# Patient Record
Sex: Female | Born: 1935 | Race: Black or African American | Hispanic: No | Marital: Married | Smoking: Never smoker
Health system: Southern US, Community
[De-identification: ages and names within clinical notes are randomized; demographics above are authoritative.]

## PROBLEM LIST (undated history)

## (undated) DIAGNOSIS — I10 Essential (primary) hypertension: Secondary | ICD-10-CM

## (undated) DIAGNOSIS — F419 Anxiety disorder, unspecified: Secondary | ICD-10-CM

## (undated) HISTORY — PX: TONSILLECTOMY: SUR1361

## (undated) HISTORY — PX: HEEL SPUR SURGERY: SHX665

## (undated) HISTORY — PX: ABDOMINAL HYSTERECTOMY: SHX81

## (undated) HISTORY — PX: FOOT SURGERY: SHX648

## (undated) HISTORY — PX: APPENDECTOMY: SHX54

---

## 1998-08-24 ENCOUNTER — Emergency Department (HOSPITAL_COMMUNITY): Admission: EM | Admit: 1998-08-24 | Discharge: 1998-08-24 | Payer: Self-pay | Admitting: Emergency Medicine

## 2001-08-20 ENCOUNTER — Ambulatory Visit (HOSPITAL_BASED_OUTPATIENT_CLINIC_OR_DEPARTMENT_OTHER): Admission: RE | Admit: 2001-08-20 | Discharge: 2001-08-20 | Payer: Self-pay | Admitting: Orthopedic Surgery

## 2005-04-01 ENCOUNTER — Emergency Department (HOSPITAL_COMMUNITY): Admission: EM | Admit: 2005-04-01 | Discharge: 2005-04-01 | Payer: Self-pay | Admitting: Family Medicine

## 2005-06-28 ENCOUNTER — Emergency Department (HOSPITAL_COMMUNITY): Admission: EM | Admit: 2005-06-28 | Discharge: 2005-06-28 | Payer: Self-pay | Admitting: Emergency Medicine

## 2006-07-10 ENCOUNTER — Emergency Department (HOSPITAL_COMMUNITY): Admission: EM | Admit: 2006-07-10 | Discharge: 2006-07-10 | Payer: Self-pay | Admitting: Emergency Medicine

## 2006-07-14 ENCOUNTER — Emergency Department (HOSPITAL_COMMUNITY): Admission: EM | Admit: 2006-07-14 | Discharge: 2006-07-14 | Payer: Self-pay | Admitting: Emergency Medicine

## 2006-10-12 ENCOUNTER — Emergency Department (HOSPITAL_COMMUNITY): Admission: EM | Admit: 2006-10-12 | Discharge: 2006-10-12 | Payer: Self-pay | Admitting: Family Medicine

## 2010-02-09 ENCOUNTER — Emergency Department (HOSPITAL_COMMUNITY): Admission: EM | Admit: 2010-02-09 | Discharge: 2010-02-09 | Payer: Self-pay | Admitting: Family Medicine

## 2010-05-15 ENCOUNTER — Emergency Department (HOSPITAL_COMMUNITY): Admission: EM | Admit: 2010-05-15 | Discharge: 2010-05-15 | Payer: Self-pay | Admitting: Family Medicine

## 2010-08-24 ENCOUNTER — Encounter: Payer: Self-pay | Admitting: Family Medicine

## 2010-08-25 ENCOUNTER — Encounter: Payer: Self-pay | Admitting: Family Medicine

## 2010-09-06 ENCOUNTER — Inpatient Hospital Stay (INDEPENDENT_AMBULATORY_CARE_PROVIDER_SITE_OTHER)
Admission: RE | Admit: 2010-09-06 | Discharge: 2010-09-06 | Disposition: A | Discharge status: Home / Self Care | Payer: Medicare Other | Source: Ambulatory Visit | Attending: Family Medicine | Admitting: Family Medicine

## 2010-09-06 DIAGNOSIS — K602 Anal fissure, unspecified: Secondary | ICD-10-CM

## 2010-10-06 ENCOUNTER — Inpatient Hospital Stay (INDEPENDENT_AMBULATORY_CARE_PROVIDER_SITE_OTHER)
Admission: RE | Admit: 2010-10-06 | Discharge: 2010-10-06 | Disposition: A | Discharge status: Home / Self Care | Payer: Medicare Other | Source: Ambulatory Visit | Attending: Family Medicine | Admitting: Family Medicine

## 2010-10-06 DIAGNOSIS — M542 Cervicalgia: Secondary | ICD-10-CM

## 2010-12-20 NOTE — Op Note (Signed)
[facility]  Patient:    Megan Robbins, Megan Robbins Visit Number: [id number] MRN: [id number]          Service Type: DSU Location: [facility] Attending Physician:  Milly Jakob Dictated by:   Harvie Junior, M.D. Proc. Date: 08/20/01 Admit Date:  08/20/2001                             Operative Report  PREOPERATIVE DIAGNOSIS:  Recalcitrant plantar fasciitis, left, with painful plantar fasciitis, right.  POSTOPERATIVE DIAGNOSIS:  Recalcitrant plantar fasciitis, left, with painful plantar fasciitis, right.  PRINCIPAL PROCEDURE: 1. Left plantar fascial release, endoscopic. 2. Injection of right plantar fascia.  SURGEON:  Harvie Junior, M.D.  ASSISTANT:  Currie Paris. Thedore Mins.  ANESTHESIA:  General.  BRIEF HISTORY:  She is a 74 year old female with a long history of having bilateral heel pain.  She ultimately had gone through injections and conservative care with stretching, ice, anti-inflammatory medication, physical therapy; none of this seemed to work.  We talked about the possibility of SonoRx versus surgical intervention; she was not desirous of undergoing SonoRx and ultimately is brought to the operating room for endoscopic plantar fascial release on the left.  We talked about injecting her again on the right and she was desirous of this.  DESCRIPTION OF PROCEDURE:  Patient was brought to operating room and after adequate anesthesia was obtained with a general anesthetic, the patient was placed on the operating table and the left leg was prepped and draped in the usual sterile fashion.  Following routine prepping and draping of the left leg, an incision was made just in line with the posterior portion of the medial malleolus.  Subcutaneous tissue was dissected down to the level of the calcaneus, the bone was identified and the plantar fascia was palpated and the cannula was then put in place just plantar to the plantar fascia.  At this point, the  plantar fascia was divided under direct vision with a triangular-type blade.  An osteotome was then brought in from the medial side and the plantar fascia had been released nicely from lateral to medial.  At this point, the wound was copiously irrigated and suctioned dry.  The wounds were closed with a 4-0 nylon interrupted suture, sterile compressive dressing was applied and attention was then turned to the right heel where an injection was done into the area of the plantar fascia with 3 of Marcaine and 1 of Depo-Medrol.  Once the injection was performed, a Band-Aid was placed and the patient was taken to the recovery room where she was noted to be in satisfactory condition.  Estimated blood loss for procedure was none. Dictated by:   Harvie Junior, M.D. Attending Physician:  Milly Jakob DD:  08/20/01 TD:  08/20/01 Job: [id number]

## 2011-11-08 ENCOUNTER — Encounter (HOSPITAL_COMMUNITY): Payer: Self-pay | Admitting: *Deleted

## 2011-11-08 ENCOUNTER — Emergency Department (INDEPENDENT_AMBULATORY_CARE_PROVIDER_SITE_OTHER)
Admission: EM | Admit: 2011-11-08 | Discharge: 2011-11-08 | Disposition: A | Discharge status: Home / Self Care | Payer: Medicare Other | Source: Home / Self Care | Attending: Emergency Medicine | Admitting: Emergency Medicine

## 2011-11-08 DIAGNOSIS — N760 Acute vaginitis: Secondary | ICD-10-CM

## 2011-11-08 DIAGNOSIS — I10 Essential (primary) hypertension: Secondary | ICD-10-CM

## 2011-11-08 HISTORY — DX: Anxiety disorder, unspecified: F41.9

## 2011-11-08 HISTORY — DX: Essential (primary) hypertension: I10

## 2011-11-08 LAB — POCT URINALYSIS DIP (DEVICE)
Bilirubin Urine: NEGATIVE
Glucose, UA: NEGATIVE mg/dL
Specific Gravity, Urine: 1.02 (ref 1.005–1.030)
pH: 5 (ref 5.0–8.0)

## 2011-11-08 LAB — WET PREP, GENITAL: Trich, Wet Prep: NONE SEEN

## 2011-11-08 NOTE — ED Provider Notes (Signed)
History     CSN: [id number]  Arrival date & time 11/08/11  1316   First MD Initiated Contact with Patient 11/08/11 1325      Chief Complaint  Patient presents with  . Dysuria    (Consider location/radiation/quality/duration/timing/severity/associated sxs/prior treatment) HPI Comments: Patient reports vaginal itching, burning starting several days ago after washing her genital region with a new soap. Seen by a primary care physician, given Diflucan, started on Lotrisone external cream. Patient states she took the Diflucan, but has not been using the Lotrisone cream. States she was "scared to use it." Has had intercourse with her husband since then, reports no dyspareunia. No vaginal bleeding, discharge. No genital rash. No urgency, frequency, oderous or cloudy urine, hematuria, abdominal pain, nausea, vomiting. No recent antibiotics. Patient has no history of trichomonas, gonorrhea, Chlamydia, herpes, HIV, syphilis.  ROS as noted in HPI. All other ROS negative.   Patient is a 76 y.o. female presenting with dysuria. The history is provided by the patient. No language interpreter was used.  Dysuria  This is a new problem. The current episode started more than 2 days ago. The problem occurs every urination. The problem has been gradually improving. There has been no fever. She is sexually active. There is no history of pyelonephritis. Pertinent negatives include no chills, no nausea, no vomiting, no discharge, no frequency, no hematuria, no hesitancy, no urgency and no flank pain.    Past Medical History  Diagnosis Date  . Hypertension   . Anxiety     Past Surgical History  Procedure Date  . Foot surgery   . Heel spur surgery   . Abdominal hysterectomy   . Tonsillectomy   . Appendectomy     History reviewed. No pertinent family history.  History  Substance Use Topics  . Smoking status: Never Smoker   . Smokeless tobacco: Not on file  . Alcohol Use: No    OB History    Grav Para Term Preterm Abortions TAB SAB Ect Mult Living                  Review of Systems  Constitutional: Negative for chills.  Gastrointestinal: Negative for nausea and vomiting.  Genitourinary: Positive for dysuria. Negative for hesitancy, urgency, frequency, hematuria and flank pain.    Allergies  Review of patient's allergies indicates no known allergies.  Home Medications   Current Outpatient Rx  Name Route Sig Dispense Refill  . CLOTRIMAZOLE-BETAMETHASONE 1-0.05 % EX CREA Topical Apply topically 2 (two) times daily.    Marland Kitchen FLUCONAZOLE 150 MG PO TABS Oral Take 150 mg by mouth once.    Marland Kitchen HYDROCHLOROTHIAZIDE 25 MG PO TABS Oral Take 25 mg by mouth daily.      BP 185/113  Temp(Src) 97.9 F (36.6 C) (Oral)  Resp 18  SpO2 99%  Physical Exam  Nursing note and vitals reviewed. Constitutional: She is oriented to person, place, and time. She appears well-developed and well-nourished. No distress.  HENT:  Head: Normocephalic and atraumatic.  Eyes: EOM are normal.  Neck: Normal range of motion.  Cardiovascular: Normal rate.   Pulmonary/Chest: Effort normal.  Abdominal: Soft. Bowel sounds are normal. She exhibits no distension. There is no tenderness. There is no rebound and no guarding.  Genitourinary: Pelvic exam was performed with patient supine. There is no rash on the right labia. There is no rash on the left labia. Right adnexum displays no mass, no tenderness and no fullness. Left adnexum displays no mass, no tenderness  and no fullness. No erythema, tenderness or bleeding around the vagina. No foreign body around the vagina.       Surgical cuff intact.  Erythematous, irritated vulvar, and inner labia. No rash external genitalia, along vaginal walls, or on cervix. Thick white nonoderous  vaginal d/c. Chaperone present during exam  Musculoskeletal: Normal range of motion.  Neurological: She is alert and oriented to person, place, and time.  Skin: Skin is warm and dry.    Psychiatric: She has a normal mood and affect. Her behavior is normal. Judgment and thought content normal.    ED Course  Procedures (including critical care time)  Labs Reviewed  POCT URINALYSIS DIP (DEVICE) - Abnormal; Notable for the following:    Hgb urine dipstick TRACE (*)    Leukocytes, UA TRACE (*) Biochemical Testing Only. Please order routine urinalysis from main lab if confirmatory testing is needed.   All other components within normal limits  WET PREP, GENITAL   No results found.   1. Vulvovaginitis   2. HTN (hypertension)    Results for orders placed during the hospital encounter of 11/08/11  POCT URINALYSIS DIP (DEVICE)      Component Value Range   Glucose, UA NEGATIVE  NEGATIVE (mg/dL)   Bilirubin Urine NEGATIVE  NEGATIVE    Ketones, ur NEGATIVE  NEGATIVE (mg/dL)   Specific Gravity, Urine 1.020  1.005 - 1.030    Hgb urine dipstick TRACE (*) NEGATIVE    pH 5.0  5.0 - 8.0    Protein, ur NEGATIVE  NEGATIVE (mg/dL)   Urobilinogen, UA 0.2  0.0 - 1.0 (mg/dL)   Nitrite NEGATIVE  NEGATIVE    Leukocytes, UA TRACE (*) NEGATIVE   WET PREP, GENITAL      Component Value Range   Yeast Wet Prep HPF POC MODERATE (*) NONE SEEN    Trich, Wet Prep NONE SEEN  NONE SEEN    Clue Cells Wet Prep HPF POC FEW (*) NONE SEEN    WBC, Wet Prep HPF POC MODERATE (*) NONE SEEN       MDM  H&P most c/w yeast infection.  Sent off  wet prep. Udip noted. Patient has no urinary symptoms. Will not treat this.Patient has already been appropriately treated for a yeast vaginitis with Diflucan x1, but is not using the Lotrisone cream. Will have her restart this. Advised pt to refrain from sexual contact until she  knows lab results, symptoms resolve, and partner(s) are treated. Pt provided working phone number. Pt agrees.   Pt hypertensive today. Has not taken BP meds today and is extremely anxious. Pt denies any CNS type sx such as HA, visual changes, focal paresis, or new onset seizure activity.  Pt denies any CV sx such as CP, dyspnea, palpitations, pedal edema, tearing pain radiating to back or abd. Pt denied any renal sx such as anuria or hematuria. Pt denies illicit drug use, or recent use of OTC medications such as nasal decongestants. Discussed the importance of taking usual BP medications, and warning symptoms/signs of hypertensive emergency. She's ago the ER for any of these. Patient agrees with plan. Pt to f/u as OP.       Luiz Blare, MD 11/08/11 2127

## 2011-11-08 NOTE — ED Notes (Signed)
Reports having vaginal itching 2 days ago - saw PCP & was given fluconazole and clotrimazole/betamethasone cream to apply.  States itching has gone, but now c/o external burning upon urination.  Pt very anxious, weepy, stating "I'm scared".  Reassurance given multiple times.  Did not take HTN med today "because I was scared to".

## 2011-11-08 NOTE — Discharge Instructions (Signed)
Take the medication as written. Give [place] a working phone number so that we can contact you if needed. Refrain from sexual contact until you know your results and your partner(s) are treated. Return if you get worse, have a fever >100.4, or for any concerns.   Go to www.goodrx.com to look up your medications   This will give you a list of where you can find your prescriptions at the most affordable prices.  It is important to keep your blood pressure under good control, as having a elevated for prolonged periods of time significantly increases your risk of stroke, heart attacks, kidney damage, eye damage, and other problems. Return here in a week for blood pressure recheck if you're able to find a primary care physician by then. Return immediately to the ER if you start having chest pain, headache, problems seeing, problems talking, problems walking, if you feel like you're about to pass out, if you do pass out, if you have a seizure, or for any other concerns.Marland Kitchen

## 2011-11-10 NOTE — ED Notes (Signed)
Wet prep: Few clue cells, mod. Yeast, mod. WBC's. Pt. adequately treated with Diflucan. Vassie Moselle 11/10/2011

## 2011-11-16 ENCOUNTER — Ambulatory Visit (INDEPENDENT_AMBULATORY_CARE_PROVIDER_SITE_OTHER): Payer: Medicare Other | Admitting: Family Medicine

## 2011-11-16 VITALS — BP 166/91 | HR 70 | Temp 97.9°F | Resp 16 | Ht 63.75 in | Wt 171.0 lb

## 2011-11-16 DIAGNOSIS — I10 Essential (primary) hypertension: Secondary | ICD-10-CM

## 2011-11-16 DIAGNOSIS — F419 Anxiety disorder, unspecified: Secondary | ICD-10-CM

## 2011-11-16 DIAGNOSIS — F411 Generalized anxiety disorder: Secondary | ICD-10-CM

## 2011-11-16 DIAGNOSIS — L259 Unspecified contact dermatitis, unspecified cause: Secondary | ICD-10-CM

## 2011-11-16 DIAGNOSIS — N760 Acute vaginitis: Secondary | ICD-10-CM

## 2011-11-16 LAB — POCT WET PREP WITH KOH: Trichomonas, UA: NEGATIVE

## 2011-11-16 MED ORDER — TERCONAZOLE 0.4 % VA CREA: 1.0000 | TOPICAL_CREAM | Freq: Every day | VAGINAL | Status: AC

## 2011-11-16 MED ORDER — ALPRAZOLAM 0.25 MG PO TABS: 0.2500 mg | ORAL_TABLET | Freq: Every evening | ORAL | Status: AC | PRN

## 2011-11-16 NOTE — Progress Notes (Signed)
  Subjective:    Patient ID: Megan Robbins, female    DOB: 10/20/1935, 76 y.o.   MRN: [id number]  HPI Patient presents complaining vaginal irritation after using new soap. Has seen her primary MD who prescribed Flagyl and Lotrisone for patient.  Patient is extremely anxious worried that her symptoms represent serious illness   Review of Systems     Objective:   Physical Exam  Constitutional: She appears well-developed.  HENT:  Mouth/Throat: Oropharynx is clear and moist.  Neck: Neck supple.  Cardiovascular: Normal rate, regular rhythm and normal heart sounds.   Pulmonary/Chest: Effort normal and breath sounds normal.  Abdominal: Soft. Bowel sounds are normal.  Genitourinary: Vagina normal. Injury: excoriation.  Neurological: She is alert.  Skin: Skin is warm.  Psychiatric:       Labile/anxious      Results for orders placed in visit on 11/16/11  POCT WET PREP WITH KOH      Component Value Range   Trichomonas, UA Negative     Clue Cells Wet Prep HPF POC 0-2     Epithelial Wet Prep HPF POC 3-6     Yeast Wet Prep HPF POC neg     Bacteria Wet Prep HPF POC 2+     RBC Wet Prep HPF POC neg     WBC Wet Prep HPF POC 3-5     KOH Prep POC Negative          Assessment & Plan:   1. Vaginitis  POCT Wet Prep with KOH, terconazole (TERAZOL 7) 0.4 % vaginal cream  2. Anxiety  ALPRAZolam (XANAX) 0.25 MG tablet   Lots of reassurance Patient to follow up with primary MD

## 2011-11-18 ENCOUNTER — Telehealth: Payer: Self-pay

## 2011-11-18 NOTE — Telephone Encounter (Signed)
SPOKE WITH PT--SHE IS VERY NERVOUS ABOUT VAGINITIS. I WENT OVER IT WITH HER AND SHE WAS CRYING ON THE PHONE. PT IS WORRIED THERE IS SOMETHING MORE WRONG WITH HER. TOLD HER IT WASN'T ANYTHING SERIOUS, BUT IF SHE DIDN'T IMPROVE SHE SHOULD COME BACK IN AND SEE [place].

## 2011-11-18 NOTE — Telephone Encounter (Signed)
Patient had some healing concerns, and wanted to have a nurse to call her back.

## 2011-11-22 ENCOUNTER — Ambulatory Visit (INDEPENDENT_AMBULATORY_CARE_PROVIDER_SITE_OTHER): Payer: Medicare Other | Admitting: Family Medicine

## 2011-11-22 VITALS — BP 167/90 | HR 64 | Temp 98.2°F | Resp 16 | Ht 63.58 in | Wt 173.0 lb

## 2011-11-22 DIAGNOSIS — L309 Dermatitis, unspecified: Secondary | ICD-10-CM

## 2011-11-22 DIAGNOSIS — F411 Generalized anxiety disorder: Secondary | ICD-10-CM

## 2011-11-22 DIAGNOSIS — F419 Anxiety disorder, unspecified: Secondary | ICD-10-CM

## 2011-11-22 DIAGNOSIS — I10 Essential (primary) hypertension: Secondary | ICD-10-CM

## 2011-11-22 DIAGNOSIS — L259 Unspecified contact dermatitis, unspecified cause: Secondary | ICD-10-CM

## 2011-11-22 NOTE — Patient Instructions (Signed)
Remember what we talked about about not worrying about everything.  Take your blood pressure medications.

## 2011-11-22 NOTE — Progress Notes (Signed)
Subjective: She is here worried about some bumps she has on her left labia. She was treated last week by Dr. Hal Hope for a vaginitis. Wet prep was fairly normal. She finds herself constantly worrying about these places and looking down there also has high blood pressure, but did not take her medication today.  Objective: Chest clear. Heart regular without murmurs. Alert oriented lady. External genitalia normal in appearance. She does have a few little bumps of some of the hair follicles on her medial thighs. These are just from the hair roots. She has a little area of hypopigmentation and scarring on the left labia majora. Cystoscopy was suspicious at all. No blisters or open lesions were noted.  Assessment: Nonspecific dermatitis and scarring Anxiety Hypertension  Plan: Discussed with the patient a little about anxiety and her need to work on trying to not worry about everything so much advise eating a balanced diet, getting regular exercise, getting enough sleep, and the  Lord to handle things rather than worrying about everything herself

## 2011-12-04 ENCOUNTER — Telehealth: Payer: Self-pay

## 2011-12-04 NOTE — Telephone Encounter (Signed)
.  umfc The patient called to request the office visit that first diagnosed her heel spurs.  Please call patient when ready for pick up.  Patient maiden name is Megan Robbins and MR is under maiden name.  Mrs. Zertuche stated that it is ok to leave message on voicemail.  Patient aware she will have to sign release of information for records. Please call patient at [phone].

## 2011-12-04 NOTE — Telephone Encounter (Signed)
LMOM regarding pt's phone message to let the patient know that we no longer have her records regarding the heel spur besides the two visits in Epic patient has not been seen here since 1999.  Pt also has a balance in [facility].  I told the patient to call me back if she had any questions regarding the message that I left for her.

## 2012-01-05 ENCOUNTER — Ambulatory Visit: Payer: Medicare Other | Admitting: Family

## 2012-02-17 ENCOUNTER — Emergency Department (HOSPITAL_COMMUNITY)
Admission: EM | Admit: 2012-02-17 | Discharge: 2012-02-17 | Disposition: A | Discharge status: Home / Self Care | Payer: Medicare Other | Source: Home / Self Care

## 2012-02-17 ENCOUNTER — Encounter (HOSPITAL_COMMUNITY): Payer: Self-pay

## 2012-02-17 DIAGNOSIS — F411 Generalized anxiety disorder: Secondary | ICD-10-CM

## 2012-02-17 DIAGNOSIS — F419 Anxiety disorder, unspecified: Secondary | ICD-10-CM

## 2012-02-17 DIAGNOSIS — I10 Essential (primary) hypertension: Secondary | ICD-10-CM

## 2012-02-17 NOTE — ED Provider Notes (Signed)
Medical screening examination/treatment/procedure(s) were performed by non-physician practitioner and as supervising physician I was immediately available for consultation/collaboration.  Leslee Home, M.D.   Reuben Likes, MD 02/17/12 [phone]

## 2012-02-17 NOTE — ED Notes (Signed)
"  I'm just anxious about everything"; Reports she had a GI upset yest PM, husband was having problems over weekend . Concerned her MD is giving her too much med for  her BP

## 2012-02-17 NOTE — ED Provider Notes (Signed)
History     CSN: [id number]  Arrival date & time 02/17/12  1414   None     Chief Complaint  Patient presents with  . Anxiety    (Consider location/radiation/quality/duration/timing/severity/associated sxs/prior treatment) HPI Comments: Pt reports feeling like her heart is racing for a couple of seconds "and then it goes up to my head and I feel pressure" that lasts for another couple of times.  Has happened several times since last night.  Pt worried "something is wrong with me".    Patient is a 76 y.o. female presenting with anxiety. The history is provided by the patient.  Anxiety This is a recurrent problem. The current episode started yesterday. The problem occurs constantly. The problem has not changed since onset.Pertinent negatives include no chest pain, no headaches and no shortness of breath. Nothing aggravates the symptoms. Nothing relieves the symptoms. She has tried nothing for the symptoms.    Past Medical History  Diagnosis Date  . Hypertension   . Anxiety     Past Surgical History  Procedure Date  . Foot surgery   . Heel spur surgery   . Abdominal hysterectomy   . Tonsillectomy   . Appendectomy     History reviewed. No pertinent family history.  History  Substance Use Topics  . Smoking status: Never Smoker   . Smokeless tobacco: Not on file  . Alcohol Use: No    OB History    Grav Para Term Preterm Abortions TAB SAB Ect Mult Living                  Review of Systems  Constitutional: Negative for fever, chills, diaphoresis, activity change and appetite change.  Respiratory: Negative for shortness of breath.   Cardiovascular: Positive for palpitations. Negative for chest pain.  Neurological: Negative for dizziness, numbness and headaches.  Psychiatric/Behavioral: The patient is nervous/anxious.     Allergies  Review of patient's allergies indicates no known allergies.  Home Medications   Current Outpatient Rx  Name Route Sig Dispense  Refill  . CLOTRIMAZOLE-BETAMETHASONE 1-0.05 % EX CREA Topical Apply topically 2 (two) times daily.    Marland Kitchen HYDROCHLOROTHIAZIDE 25 MG PO TABS Oral Take 25 mg by mouth daily.    Marland Kitchen OLMESARTAN MEDOXOMIL-HCTZ 40-12.5 MG PO TABS Oral Take 1 tablet by mouth daily.      BP 186/105  Pulse 57  Temp 97.3 F (36.3 C) (Oral)  Resp 16  SpO2 97%  Physical Exam  Constitutional: She appears well-developed and well-nourished.       Appears anxious  Neck: Neck supple. Carotid bruit is not present.  Cardiovascular: Normal rate, regular rhythm and normal heart sounds.   Pulmonary/Chest: Effort normal and breath sounds normal.  Psychiatric: Her mood appears anxious.       Pt reports that she is worried "a lot".      ED Course  Procedures (including critical care time)  Labs Reviewed - No data to display No results found.   1. Anxiety   2. Hypertension       MDM  EKG: sinus brady at 53bpm.  Discussed with Dr. Lorenz Coaster.  Pt appears very anxious, is very worried something is wrong with her.  When I tell pt I think she is fine, she gets very excited and announces she's going to work on being "better" about "all this worry I have".  Referred to psychiatry- review of pt's charts and pt's own report indicate long standing, poorly controlled anxiety.  Hopefully pt  will benefit from mental health care.    Pt also with high blood pressure today, on recheck was 195/111.  Denies headache or chest pain.  Pt reports she did not take her blood pressure medicine today.        Cathlyn Parsons, NP 02/17/12 1654  Cathlyn Parsons, NP 02/17/12 1655

## 2012-03-15 ENCOUNTER — Ambulatory Visit (HOSPITAL_COMMUNITY): Payer: Medicare Other | Admitting: Psychiatry

## 2012-03-26 ENCOUNTER — Telehealth (HOSPITAL_COMMUNITY): Payer: Self-pay

## 2012-03-29 ENCOUNTER — Ambulatory Visit (HOSPITAL_COMMUNITY): Payer: Medicare Other | Admitting: Psychiatry

## 2012-07-13 ENCOUNTER — Ambulatory Visit: Payer: Medicare Other | Admitting: Rehabilitation

## 2012-07-13 DIAGNOSIS — M25676 Stiffness of unspecified foot, not elsewhere classified: Secondary | ICD-10-CM | POA: Insufficient documentation

## 2012-07-13 DIAGNOSIS — M25673 Stiffness of unspecified ankle, not elsewhere classified: Secondary | ICD-10-CM | POA: Insufficient documentation

## 2012-07-13 DIAGNOSIS — R262 Difficulty in walking, not elsewhere classified: Secondary | ICD-10-CM | POA: Insufficient documentation

## 2012-07-13 DIAGNOSIS — IMO0001 Reserved for inherently not codable concepts without codable children: Secondary | ICD-10-CM | POA: Insufficient documentation

## 2012-07-13 DIAGNOSIS — M25579 Pain in unspecified ankle and joints of unspecified foot: Secondary | ICD-10-CM | POA: Insufficient documentation

## 2012-07-15 ENCOUNTER — Ambulatory Visit: Payer: Medicare Other | Admitting: Rehabilitation

## 2012-07-20 ENCOUNTER — Encounter: Payer: Self-pay | Admitting: Rehabilitation

## 2012-07-23 ENCOUNTER — Ambulatory Visit: Payer: Medicare Other | Admitting: Rehabilitation

## 2012-07-27 ENCOUNTER — Encounter: Payer: Self-pay | Admitting: Rehabilitation

## 2012-07-29 ENCOUNTER — Ambulatory Visit: Payer: Medicare Other | Admitting: Rehabilitation

## 2012-08-06 ENCOUNTER — Ambulatory Visit: Payer: Medicare Other | Attending: Internal Medicine | Admitting: Rehabilitation

## 2012-08-06 DIAGNOSIS — M25673 Stiffness of unspecified ankle, not elsewhere classified: Secondary | ICD-10-CM | POA: Insufficient documentation

## 2012-08-06 DIAGNOSIS — IMO0001 Reserved for inherently not codable concepts without codable children: Secondary | ICD-10-CM | POA: Insufficient documentation

## 2012-08-06 DIAGNOSIS — M25676 Stiffness of unspecified foot, not elsewhere classified: Secondary | ICD-10-CM | POA: Insufficient documentation

## 2012-08-06 DIAGNOSIS — M25579 Pain in unspecified ankle and joints of unspecified foot: Secondary | ICD-10-CM | POA: Insufficient documentation

## 2012-08-06 DIAGNOSIS — R262 Difficulty in walking, not elsewhere classified: Secondary | ICD-10-CM | POA: Insufficient documentation

## 2012-08-10 ENCOUNTER — Encounter: Payer: Self-pay | Admitting: Rehabilitation

## 2013-07-20 ENCOUNTER — Telehealth: Payer: Self-pay | Admitting: *Deleted

## 2013-07-20 NOTE — Telephone Encounter (Signed)
Spoke with patient earlier. Pt will schedule an appointment at a later time.

## 2013-07-20 NOTE — Telephone Encounter (Signed)
Pt states she has a fungus between her little toes.  I told pt I would have scheduler call for an appt as soon as possible, that she should soak her foot in epsom salt 1/4 C to 1 quart of warm water, pat dry between and beneath the toes and allow to air dry.  I encouraged pt to call with concerns.

## 2014-06-10 ENCOUNTER — Ambulatory Visit (INDEPENDENT_AMBULATORY_CARE_PROVIDER_SITE_OTHER): Payer: Medicare Other | Admitting: Internal Medicine

## 2014-06-10 ENCOUNTER — Ambulatory Visit (INDEPENDENT_AMBULATORY_CARE_PROVIDER_SITE_OTHER): Payer: Medicare Other

## 2014-06-10 VITALS — BP 153/87 | HR 74 | Temp 98.1°F | Resp 16 | Ht 64.5 in | Wt 177.0 lb

## 2014-06-10 DIAGNOSIS — M25551 Pain in right hip: Secondary | ICD-10-CM

## 2014-06-10 DIAGNOSIS — M545 Low back pain: Secondary | ICD-10-CM

## 2014-06-10 LAB — POCT UA - MICROSCOPIC ONLY
Bacteria, U Microscopic: NEGATIVE
CASTS, UR, LPF, POC: NEGATIVE
CRYSTALS, UR, HPF, POC: NEGATIVE
Mucus, UA: NEGATIVE
RBC, urine, microscopic: NEGATIVE
Yeast, UA: NEGATIVE

## 2014-06-10 LAB — POCT URINALYSIS DIPSTICK
Bilirubin, UA: NEGATIVE
Glucose, UA: NEGATIVE
Ketones, UA: NEGATIVE
Leukocytes, UA: NEGATIVE
NITRITE UA: NEGATIVE
PH UA: 5
PROTEIN UA: NEGATIVE
RBC UA: NEGATIVE
Spec Grav, UA: 1.025
UROBILINOGEN UA: 0.2

## 2014-06-10 MED ORDER — METHOCARBAMOL 750 MG PO TABS: 750.0000 mg | ORAL_TABLET | Freq: Four times a day (QID) | ORAL | Status: DC

## 2014-06-10 NOTE — Patient Instructions (Signed)
Back Pain, Adult Low back pain is very common. About 1 in 5 people have back pain.The cause of low back pain is rarely dangerous. The pain often gets better over time.About half of people with a sudden onset of back pain feel better in just 2 weeks. About 8 in 10 people feel better by 6 weeks.  CAUSES Some common causes of back pain include:  Strain of the muscles or ligaments supporting the spine.  Wear and tear (degeneration) of the spinal discs.  Arthritis.  Direct injury to the back. DIAGNOSIS Most of the time, the direct cause of low back pain is not known.However, back pain can be treated effectively even when the exact cause of the pain is unknown.Answering your caregiver's questions about your overall health and symptoms is one of the most accurate ways to make sure the cause of your pain is not dangerous. If your caregiver needs more information, he or she may order lab work or imaging tests (X-rays or MRIs).However, even if imaging tests show changes in your back, this usually does not require surgery. HOME CARE INSTRUCTIONS For many people, back pain returns.Since low back pain is rarely dangerous, it is often a condition that people can learn to manageon their own.   Remain active. It is stressful on the back to sit or stand in one place. Do not sit, drive, or stand in one place for more than 30 minutes at a time. Take short walks on level surfaces as soon as pain allows.Try to increase the length of time you walk each day.  Do not stay in bed.Resting more than 1 or 2 days can delay your recovery.  Do not avoid exercise or work.Your body is made to move.It is not dangerous to be active, even though your back may hurt.Your back will likely heal faster if you return to being active before your pain is gone.  Pay attention to your body when you bend and lift. Many people have less discomfortwhen lifting if they bend their knees, keep the load close to their bodies,and  avoid twisting. Often, the most comfortable positions are those that put less stress on your recovering back.  Find a comfortable position to sleep. Use a firm mattress and lie on your side with your knees slightly bent. If you lie on your back, put a pillow under your knees.  Only take over-the-counter or prescription medicines as directed by your caregiver. Over-the-counter medicines to reduce pain and inflammation are often the most helpful.Your caregiver may prescribe muscle relaxant drugs.These medicines help dull your pain so you can more quickly return to your normal activities and healthy exercise.  Put ice on the injured area.  Put ice in a plastic bag.  Place a towel between your skin and the bag.  Leave the ice on for 15-20 minutes, 03-04 times a day for the first 2 to 3 days. After that, ice and heat may be alternated to reduce pain and spasms.  Ask your caregiver about trying back exercises and gentle massage. This may be of some benefit.  Avoid feeling anxious or stressed.Stress increases muscle tension and can worsen back pain.It is important to recognize when you are anxious or stressed and learn ways to manage it.Exercise is a great option. SEEK MEDICAL CARE IF:  You have pain that is not relieved with rest or medicine.  You have pain that does not improve in 1 week.  You have new symptoms.  You are generally not feeling well. SEEK   IMMEDIATE MEDICAL CARE IF:   You have pain that radiates from your back into your legs.  You develop new bowel or bladder control problems.  You have unusual weakness or numbness in your arms or legs.  You develop nausea or vomiting.  You develop abdominal pain.  You feel faint. Document Released: 07/21/2005 Document Revised: 01/20/2012 Document Reviewed: 11/22/2013 Exit[employer] Patient Information 2015 [employer]. This information is not intended to replace advice given to you by your health care provider. Make sure you  discuss any questions you have with your health care provider.  

## 2014-06-10 NOTE — Progress Notes (Signed)
   Subjective:    Patient ID: Megan Robbins, female    DOB: September 01, 1935, 78 y.o.   MRN: [id number]  HPI Mrs. Alfredo Bachtkinson is a 78 y.o. Female who presents today with pain in her left lower side. She states that the pain started on the left side but has started to spread over to the right side and down her right thigh midway. The pain is located in her lower back. She states she helped a friend moved last week and noticed the pain then. When she walks, she does not notice the pain. Only when she is sitting down for long periods of time, such as driving.  She states that when she holds her left hip, the pain goes away a little sometimes. No OTC medications have helped with the pain sensation.     Review of Systems     Objective:   Physical Exam  Constitutional: She is oriented to person, place, and time. She appears well-developed and well-nourished. No distress.  HENT:  Head: Normocephalic.  Eyes: Conjunctivae and EOM are normal. Pupils are equal, round, and reactive to light.  Neck: Normal range of motion.  Pulmonary/Chest: Effort normal.  Musculoskeletal: Normal range of motion. She exhibits tenderness.  Neurological: She is alert and oriented to person, place, and time. She exhibits normal muscle tone. Coordination normal.  Psychiatric: She has a normal mood and affect. Her behavior is normal. Judgment and thought content normal.      [facility] reading (PRIMARY) by  [email or web address], [email or web address]     Assessment & Plan:  Back and hip strain Tylenol/Robaxin

## 2014-08-21 ENCOUNTER — Telehealth: Payer: Self-pay | Admitting: Family Medicine

## 2014-08-21 NOTE — Telephone Encounter (Signed)
Unable to leave message at this time

## 2014-10-04 ENCOUNTER — Ambulatory Visit (INDEPENDENT_AMBULATORY_CARE_PROVIDER_SITE_OTHER): Payer: Medicare Other | Admitting: Family Medicine

## 2014-10-04 VITALS — BP 144/94 | HR 64 | Temp 98.0°F | Resp 17 | Ht 64.5 in | Wt 177.0 lb

## 2014-10-04 DIAGNOSIS — I10 Essential (primary) hypertension: Secondary | ICD-10-CM | POA: Diagnosis not present

## 2014-10-04 MED ORDER — ATENOLOL 50 MG PO TABS: 50.0000 mg | ORAL_TABLET | Freq: Every day | ORAL | Status: DC

## 2014-10-04 MED ORDER — OLMESARTAN MEDOXOMIL-HCTZ 40-12.5 MG PO TABS: ORAL_TABLET | ORAL | Status: DC

## 2014-10-04 NOTE — Patient Instructions (Addendum)
Take the olmesartan/HCTZ 1 each morning for blood pressure  Take the atenolol 1 each evening for blood pressure  Even though I gave you plenty of refills for one year, I recommend that you return in about 4 months for a recheck. At that time he probably will need to her labs rechecked.  Return sooner if problems

## 2014-10-04 NOTE — Progress Notes (Signed)
Subjective: [age] sure lady here for refill of her blood pressure medications she feels great with no complaints. She ran out of meds yesterday's her pressure is little high today. Review of systems is unremarkable. No headaches or dizziness or chest pain or shortness of breath or abdominal complaints.  Objective: Her chest is clear. Heart regular with a grade 2/6 systolic murmur right upper sternal border. No arrhythmias. No edema.  Assessment: Hypertension, essential  Plan: Refill medications. Return 4 months.

## 2015-04-11 ENCOUNTER — Ambulatory Visit (INDEPENDENT_AMBULATORY_CARE_PROVIDER_SITE_OTHER): Payer: Medicare Other | Admitting: Family Medicine

## 2015-04-11 VITALS — BP 160/96 | HR 65 | Temp 98.1°F | Resp 18 | Ht 64.5 in | Wt 182.0 lb

## 2015-04-11 DIAGNOSIS — J309 Allergic rhinitis, unspecified: Secondary | ICD-10-CM

## 2015-04-11 DIAGNOSIS — J209 Acute bronchitis, unspecified: Secondary | ICD-10-CM | POA: Diagnosis not present

## 2015-04-11 DIAGNOSIS — I10 Essential (primary) hypertension: Secondary | ICD-10-CM | POA: Diagnosis not present

## 2015-04-11 DIAGNOSIS — Z636 Dependent relative needing care at home: Secondary | ICD-10-CM | POA: Diagnosis not present

## 2015-04-11 MED ORDER — BENZONATATE 100 MG PO CAPS: 100.0000 mg | ORAL_CAPSULE | Freq: Two times a day (BID) | ORAL | Status: DC | PRN

## 2015-04-11 MED ORDER — GUAIFENESIN-CODEINE 100-10 MG/5ML PO SOLN: 5.0000 mL | ORAL | Status: DC | PRN

## 2015-04-11 MED ORDER — IPRATROPIUM BROMIDE 0.03 % NA SOLN: 2.0000 | Freq: Four times a day (QID) | NASAL | Status: DC

## 2015-04-11 NOTE — Patient Instructions (Signed)
Managing Your High Blood Pressure Blood pressure is a measurement of how forceful your blood is pressing against the walls of the arteries. Arteries are muscular tubes within the circulatory system. Blood pressure does not stay the same. Blood pressure rises when you are active, excited, or nervous; and it lowers during sleep and relaxation. If the numbers measuring your blood pressure stay above normal most of the time, you are at risk for health problems. High blood pressure (hypertension) is a long-term (chronic) condition in which blood pressure is elevated. A blood pressure reading is recorded as two numbers, such as 120 over 80 (or 120/80). The first, higher number is called the systolic pressure. It is a measure of the pressure in your arteries as the heart beats. The second, lower number is called the diastolic pressure. It is a measure of the pressure in your arteries as the heart relaxes between beats.  Keeping your blood pressure in a normal range is important to your overall health and prevention of health problems, such as heart disease and stroke. When your blood pressure is uncontrolled, your heart has to work harder than normal. High blood pressure is a very common condition in adults because blood pressure tends to rise with age. Men and women are equally likely to have hypertension but at different times in life. Before age 45, men are more likely to have hypertension. After 77 years of age, women are more likely to have it. Hypertension is especially common in African Americans. This condition often has no signs or symptoms. The cause of the condition is usually not known. Your caregiver can help you come up with a plan to keep your blood pressure in a normal, healthy range. BLOOD PRESSURE STAGES Blood pressure is classified into four stages: normal, prehypertension, stage 1, and stage 2. Your blood pressure reading will be used to determine what type of treatment, if any, is necessary.  Appropriate treatment options are tied to these four stages:  Normal  Systolic pressure (mm Hg): below 120.  Diastolic pressure (mm Hg): below 80. Prehypertension  Systolic pressure (mm Hg): 120 to 139.  Diastolic pressure (mm Hg): 80 to 89. Stage1  Systolic pressure (mm Hg): 140 to 159.  Diastolic pressure (mm Hg): 90 to 99. Stage2  Systolic pressure (mm Hg): 160 or above.  Diastolic pressure (mm Hg): 100 or above. RISKS RELATED TO HIGH BLOOD PRESSURE Managing your blood pressure is an important responsibility. Uncontrolled high blood pressure can lead to:  A heart attack.  A stroke.  A weakened blood vessel (aneurysm).  Heart failure.  Kidney damage.  Eye damage.  Metabolic syndrome.  Memory and concentration problems. HOW TO MANAGE YOUR BLOOD PRESSURE Blood pressure can be managed effectively with lifestyle changes and medicines (if needed). Your caregiver will help you come up with a plan to bring your blood pressure within a normal range. Your plan should include the following: Education  Read all information provided by your caregivers about how to control blood pressure.  Educate yourself on the latest guidelines and treatment recommendations. New research is always being done to further define the risks and treatments for high blood pressure. Lifestylechanges  Control your weight.  Avoid smoking.  Stay physically active.  Reduce the amount of salt in your diet.  Reduce stress.  Control any chronic conditions, such as high cholesterol or diabetes.  Reduce your alcohol intake. Medicines  Several medicines (antihypertensive medicines) are available, if needed, to bring blood pressure within a normal range.   Communication  Review all the medicines you take with your caregiver because there may be side effects or interactions.  Talk with your caregiver about your diet, exercise habits, and other lifestyle factors that may be contributing to  high blood pressure.  See your caregiver regularly. Your caregiver can help you create and adjust your plan for managing high blood pressure. RECOMMENDATIONS FOR TREATMENT AND FOLLOW-UP  The following recommendations are based on current guidelines for managing high blood pressure in nonpregnant adults. Use these recommendations to identify the proper follow-up period or treatment option based on your blood pressure reading. You can discuss these options with your caregiver.  Systolic pressure of 120 to 139 or diastolic pressure of 80 to 89: Follow up with your caregiver as directed.  Systolic pressure of 140 to 160 or diastolic pressure of 90 to 100: Follow up with your caregiver within 2 months.  Systolic pressure above 160 or diastolic pressure above 100: Follow up with your caregiver within 1 month.  Systolic pressure above 180 or diastolic pressure above 110: Consider antihypertensive therapy; follow up with your caregiver within 1 week.  Systolic pressure above 200 or diastolic pressure above 120: Begin antihypertensive therapy; follow up with your caregiver within 1 week. Document Released: 04/14/2012 Document Reviewed: 04/14/2012 [facility] Patient Information 2015 [place], [place]. This information is not intended to replace advice given to you by your health care provider. Make sure you discuss any questions you have with your health care provider.  Acute Bronchitis Bronchitis is inflammation of the airways that extend from the windpipe into the lungs (bronchi). The inflammation often causes mucus to develop. This leads to a cough, which is the most common symptom of bronchitis.  In acute bronchitis, the condition usually develops suddenly and goes away over time, usually in a couple weeks. Smoking, allergies, and asthma can make bronchitis worse. Repeated episodes of bronchitis may cause further lung problems.  CAUSES Acute bronchitis is most often caused by the same virus that causes a  cold. The virus can spread from person to person (contagious) through coughing, sneezing, and touching contaminated objects. SIGNS AND SYMPTOMS   Cough.   Fever.   Coughing up mucus.   Body aches.   Chest congestion.   Chills.   Shortness of breath.   Sore throat.  DIAGNOSIS  Acute bronchitis is usually diagnosed through a physical exam. Your health care provider will also ask you questions about your medical history. Tests, such as chest X-rays, are sometimes done to rule out other conditions.  TREATMENT  Acute bronchitis usually goes away in a couple weeks. Oftentimes, no medical treatment is necessary. Medicines are sometimes given for relief of fever or cough. Antibiotic medicines are usually not needed but may be prescribed in certain situations. In some cases, an inhaler may be recommended to help reduce shortness of breath and control the cough. A cool mist vaporizer may also be used to help thin bronchial secretions and make it easier to clear the chest.  HOME CARE INSTRUCTIONS  Get plenty of rest.   Drink enough fluids to keep your urine clear or pale yellow (unless you have a medical condition that requires fluid restriction). Increasing fluids may help thin your respiratory secretions (sputum) and reduce chest congestion, and it will prevent dehydration.   Take medicines only as directed by your health care provider.  If you were prescribed an antibiotic medicine, finish it all even if you start to feel better.  Avoid smoking and secondhand smoke. Exposure  to cigarette smoke or irritating chemicals will make bronchitis worse. If you are a smoker, consider using nicotine gum or skin patches to help control withdrawal symptoms. Quitting smoking will help your lungs heal faster.   Reduce the chances of another bout of acute bronchitis by washing your hands frequently, avoiding people with cold symptoms, and trying not to touch your hands to your mouth, nose, or  eyes.   Keep all follow-up visits as directed by your health care provider.  SEEK MEDICAL CARE IF: Your symptoms do not improve after 1 week of treatment.  SEEK IMMEDIATE MEDICAL CARE IF:  You develop an increased fever or chills.   You have chest pain.   You have severe shortness of breath.  You have bloody sputum.   You develop dehydration.  You faint or repeatedly feel like you are going to pass out.  You develop repeated vomiting.  You develop a severe headache. MAKE SURE YOU:   Understand these instructions.  Will watch your condition.  Will get help right away if you are not doing well or get worse. Document Released: 08/28/2004 Document Revised: 12/05/2013 Document Reviewed: 01/11/2013 [facility] Patient Information 2015 [place], [place]. This information is not intended to replace advice given to you by your health care provider. Make sure you discuss any questions you have with your health care provider.

## 2015-04-11 NOTE — Progress Notes (Signed)
Subjective:    Patient ID: Megan Robbins, female    DOB: 1935/08/28, 79 y.o.   MRN: [id number] This chart was scribed for Norberto Sorenson, MD by Littie Deeds, Medical Scribe. This patient was seen in Room 10 and the patient's care was started at 9:34 AM.   Chief Complaint  Patient presents with  . Cough    Productive onset 3 days  . Sore Throat    HPI HPI Comments: Megan Robbins is a 79 y.o. female with a history of hypertension who presents to the [facility] complaining of gradual onset, productive cough of clear mucus that started 3 days ago. She also reports having associated voice hoarseness. She states she has sneezing at baseline due to allergies. The cough does interrupt her sleep. She has not tried any OTC medications for her symptoms. Patient denies SOB, wheezing, and chest pain. She also denies smoking and history of asthma.   She believes her blood pressure is elevated because she has been taking care of her sister, who has dementia and is mean to her.  Patient recently returned from a family reunion.  Past Medical History  Diagnosis Date  . Hypertension   . Anxiety    Past Surgical History  Procedure Laterality Date  . Foot surgery    . Heel spur surgery    . Abdominal hysterectomy    . Tonsillectomy    . Appendectomy     Current Outpatient Prescriptions on File Prior to Visit  Medication Sig Dispense Refill  . atenolol (TENORMIN) 50 MG tablet Take 1 tablet (50 mg total) by mouth daily. 90 tablet 3  . olmesartan-hydrochlorothiazide (BENICAR HCT) 40-12.5 MG per tablet Take one each morning for blood pressure 90 tablet 3  . clotrimazole-betamethasone (LOTRISONE) cream Apply topically 2 (two) times daily.    . methocarbamol (ROBAXIN-750) 750 MG tablet Take 1 tablet (750 mg total) by mouth 4 (four) times daily. (Patient not taking: Reported on 10/04/2014) 40 tablet 1   No current facility-administered medications on file prior to visit.   No Known  Allergies Family History  Problem Relation Age of Onset  . Hyperlipidemia Mother    Social History   Social History  . Marital Status: Married    Spouse Name: N/A  . Number of Children: N/A  . Years of Education: N/A   Social History Main Topics  . Smoking status: Never Smoker   . Smokeless tobacco: None  . Alcohol Use: No  . Drug Use: No  . Sexual Activity: Yes    Birth Control/ Protection: Post-menopausal   Other Topics Concern  . None   Social History Narrative     Review of Systems  Constitutional: Positive for fever, diaphoresis, activity change and fatigue. Negative for chills, appetite change and unexpected weight change.  HENT: Positive for congestion, postnasal drip, rhinorrhea, sneezing, trouble swallowing and voice change. Negative for ear pain, facial swelling and sinus pressure.   Respiratory: Positive for cough. Negative for chest tightness, shortness of breath and wheezing.   Cardiovascular: Negative for chest pain and palpitations.  Allergic/Immunologic: Positive for environmental allergies.  Neurological: Positive for headaches.  Psychiatric/Behavioral: Positive for sleep disturbance.       Objective:  BP 160/96 mmHg  Pulse 65  Temp(Src) 98.1 F (36.7 C) (Oral)  Resp 18  Ht 5' 4.5" (1.638 m)  Wt 182 lb (82.555 kg)  BMI 30.77 kg/m2  SpO2 99%  Physical Exam  Constitutional: She is oriented to  person, place, and time. She appears well-developed and well-nourished. No distress.  HENT:  Head: Normocephalic and atraumatic.  Right Ear: Tympanic membrane normal.  Left Ear: Tympanic membrane normal.  Mouth/Throat: Oropharynx is clear and moist. No oropharyngeal exudate.  Nares boggy and pale. Oropharynx with postnasal drip.  Eyes: Pupils are equal, round, and reactive to light.  Neck: Neck supple.  Cardiovascular: Normal rate, regular rhythm, S1 normal and S2 normal.   Murmur heard.  Systolic murmur is present with a grade of 2/6  2/6 systolic  ejection murmur, left upper sternal border. Repeat blood pressure: 180/100.  Pulmonary/Chest: Effort normal and breath sounds normal. No respiratory distress. She has no wheezes. She has no rales.  Clear to auscultation bilaterally.   Musculoskeletal: She exhibits no edema.  Neurological: She is alert and oriented to person, place, and time. No cranial nerve deficit.  Skin: Skin is warm and dry. No rash noted.  Psychiatric: She has a normal mood and affect. Her behavior is normal.  Nursing note and vitals reviewed.         Assessment & Plan:   1. Acute bronchitis, unspecified organism   2. Allergic rhinitis, unspecified allergic rhinitis type   3. Essential hypertension, benign   4. Caregiver stress     Meds ordered this encounter  Medications  . benzonatate (TESSALON) 100 MG capsule    Sig: Take 1 capsule (100 mg total) by mouth 2 (two) times daily as needed for cough.    Dispense:  60 capsule    Refill:  0  . ipratropium (ATROVENT) 0.03 % nasal spray    Sig: Place 2 sprays into the nose 4 (four) times daily.    Dispense:  30 mL    Refill:  0  . guaiFENesin-codeine 100-10 MG/5ML syrup    Sig: Take 5-10 mLs by mouth every 4 (four) hours as needed for cough.    Dispense:  180 mL    Refill:  0    I personally performed the services described in this documentation, which was scribed in my presence. The recorded information has been reviewed and considered, and addended by me as needed.  Norberto Sorenson, MD MPH

## 2015-05-02 ENCOUNTER — Ambulatory Visit (INDEPENDENT_AMBULATORY_CARE_PROVIDER_SITE_OTHER): Payer: Medicare Other | Admitting: Family Medicine

## 2015-05-02 VITALS — BP 180/90 | HR 68 | Temp 97.8°F | Resp 17 | Ht 63.0 in | Wt 181.0 lb

## 2015-05-02 DIAGNOSIS — R05 Cough: Secondary | ICD-10-CM

## 2015-05-02 DIAGNOSIS — I10 Essential (primary) hypertension: Secondary | ICD-10-CM | POA: Diagnosis not present

## 2015-05-02 DIAGNOSIS — J019 Acute sinusitis, unspecified: Secondary | ICD-10-CM

## 2015-05-02 DIAGNOSIS — IMO0001 Reserved for inherently not codable concepts without codable children: Secondary | ICD-10-CM

## 2015-05-02 DIAGNOSIS — R030 Elevated blood-pressure reading, without diagnosis of hypertension: Secondary | ICD-10-CM | POA: Diagnosis not present

## 2015-05-02 DIAGNOSIS — F419 Anxiety disorder, unspecified: Secondary | ICD-10-CM

## 2015-05-02 DIAGNOSIS — R059 Cough, unspecified: Secondary | ICD-10-CM

## 2015-05-02 MED ORDER — AZITHROMYCIN 250 MG PO TABS: ORAL_TABLET | ORAL | Status: DC

## 2015-05-02 NOTE — Progress Notes (Signed)
Patient ID: Megan Robbins, female    DOB: 04-29-1936  Age: 79 y.o. MRN: [id number]  Chief Complaint  Patient presents with  . Cough  . URI  . Anxiety    Subjective:   Elderly lady complaining of head congestion and drainage for several weeks.  Coughing, mildly productive.  No fever or SOB.  She is quite anxious.   Current allergies, medications, problem list, past/family and social histories reviewed.  Objective:  BP 180/90 mmHg  Pulse 68  Temp(Src) 97.8 F (36.6 C) (Oral)  Resp 17  Ht  (1.6 m)  Wt 181 lb (82.101 kg)  BMI 32.07 kg/m2  SpO2 98%  Blood pressure noted.  Similar on repeat.  Anxious.  Sinuses mild tenderness. Throat clear.  Neck supple without nodes.  Chest clear.  Heart rrr.   Assessment & Plan:   Assessment: No diagnosis found.    Plan: See instructions   Meds ordered this encounter  Medications  . azithromycin (ZITHROMAX) 250 MG tablet    Sig: Take 2 initially, then 1 pill daily for infection    Dispense:  6 tablet    Refill:  0     Patient Instructions  Take your blood pressure medication faithfully. Get your blood pressure checked somewhere again in 3 or 4 days. If it is still running high next week let me know.  Take the antibiotics, azithromycin, 2 pills initially then 1 daily for 4 days  Continue using the little cough pills, benzonatate, one pill 3 times daily  Take the Zyrtec (cetirizine) one daily at bedtime  If after the above medications over the next week you're not doing better come back and I will get an x-ray and some lab work on you, but I do not feel like it is necessary today      Return if symptoms worsen or fail to improve.   HOPPER,DAVID, MD 05/06/2015

## 2015-05-02 NOTE — Patient Instructions (Signed)
Take your blood pressure medication faithfully. Get your blood pressure checked somewhere again in 3 or 4 days. If it is still running high next week let me know.  Take the antibiotics, azithromycin, 2 pills initially then 1 daily for 4 days  Continue using the little cough pills, benzonatate, one pill 3 times daily  Take the Zyrtec (cetirizine) one daily at bedtime  If after the above medications over the next week you're not doing better come back and I will get an x-ray and some lab work on you, but I do not feel like it is necessary today

## 2015-05-08 ENCOUNTER — Ambulatory Visit (INDEPENDENT_AMBULATORY_CARE_PROVIDER_SITE_OTHER): Payer: Medicare Other

## 2015-05-08 ENCOUNTER — Ambulatory Visit (INDEPENDENT_AMBULATORY_CARE_PROVIDER_SITE_OTHER): Payer: Medicare Other | Admitting: Emergency Medicine

## 2015-05-08 VITALS — BP 180/100 | HR 61 | Temp 97.7°F | Resp 18 | Ht 63.0 in | Wt 180.0 lb

## 2015-05-08 DIAGNOSIS — R059 Cough, unspecified: Secondary | ICD-10-CM

## 2015-05-08 DIAGNOSIS — I10 Essential (primary) hypertension: Secondary | ICD-10-CM

## 2015-05-08 DIAGNOSIS — R05 Cough: Secondary | ICD-10-CM

## 2015-05-08 MED ORDER — LOSARTAN POTASSIUM-HCTZ 100-25 MG PO TABS: 1.0000 | ORAL_TABLET | Freq: Every day | ORAL | Status: DC

## 2015-05-08 NOTE — Patient Instructions (Signed)

## 2015-05-08 NOTE — Progress Notes (Signed)
Subjective:  Patient ID: Megan Robbins, female    DOB: [birth year]  Age: 79 y.o. MRN: [id number]  CC: Follow-up and Chest Pain   HPI Beyonka L Austell presents  patient has been treated repeatedly for cough she was communicating and persistent cough is nonproductive cough no fever chills wheezing or shortness of breath. She does have a history seasonal allergic rhinitis and has a home clear nasal drainage with some postnasal drip. She has no fever or chills. She also has high blood pressure and says that her high blood pressure is elevated control because she is "sick" in retrospect looking at her old chart she's been out of control for most of last year.  History Addis has a past medical history of Hypertension and Anxiety.   She has past surgical history that includes Foot surgery; Heel spur surgery; Abdominal hysterectomy; Tonsillectomy; and Appendectomy.   Her  family history includes Hyperlipidemia in her mother.  She   reports that she has never smoked. She does not have any smokeless tobacco history on file. She reports that she does not drink alcohol or use illicit drugs.  Outpatient Prescriptions Prior to Visit  Medication Sig Dispense Refill  . atenolol (TENORMIN) 50 MG tablet Take 1 tablet (50 mg total) by mouth daily. 90 tablet 3  . azithromycin (ZITHROMAX) 250 MG tablet Take 2 initially, then 1 pill daily for infection 6 tablet 0  . olmesartan-hydrochlorothiazide (BENICAR HCT) 40-12.5 MG per tablet Take one each morning for blood pressure 90 tablet 3   No facility-administered medications prior to visit.    Social History   Social History  . Marital Status: Married    Spouse Name: N/A  . Number of Children: N/A  . Years of Education: N/A   Social History Main Topics  . Smoking status: Never Smoker   . Smokeless tobacco: None  . Alcohol Use: No  . Drug Use: No  . Sexual Activity: Yes    Birth Control/ Protection: Post-menopausal   Other Topics Concern  .  None   Social History Narrative     Review of Systems  Constitutional: Negative for fever, chills and appetite change.  HENT: Negative for congestion, ear pain, postnasal drip, sinus pressure and sore throat.   Eyes: Negative for pain and redness.  Respiratory: Positive for cough. Negative for shortness of breath and wheezing.   Cardiovascular: Negative for leg swelling.  Gastrointestinal: Negative for nausea, vomiting, abdominal pain, diarrhea, constipation and blood in stool.  Endocrine: Negative for polyuria.  Genitourinary: Negative for dysuria, urgency, frequency and flank pain.  Musculoskeletal: Negative for gait problem.  Skin: Negative for rash.  Neurological: Negative for weakness and headaches.  Psychiatric/Behavioral: Negative for confusion and decreased concentration. The patient is not nervous/anxious.     Objective:  BP 180/100 mmHg  Pulse 61  Temp(Src) 97.7 F (36.5 C) (Oral)  Resp 18  Ht  (1.6 m)  Wt 180 lb (81.647 kg)  BMI 31.89 kg/m2  SpO2 99%  Physical Exam  Constitutional: She is oriented to person, place, and time. She appears well-developed and well-nourished. No distress.  HENT:  Head: Normocephalic and atraumatic.  Right Ear: External ear normal.  Left Ear: External ear normal.  Nose: Nose normal.  Eyes: Conjunctivae and EOM are normal. Pupils are equal, round, and reactive to light. No scleral icterus.  Neck: Normal range of motion. Neck supple. No tracheal deviation present.  Cardiovascular: Normal rate, regular rhythm and normal heart sounds.   Pulmonary/Chest:  Effort normal. No respiratory distress. She has no wheezes. She has no rales.  Abdominal: She exhibits no mass. There is no tenderness. There is no rebound and no guarding.  Musculoskeletal: She exhibits no edema.  Lymphadenopathy:    She has no cervical adenopathy.  Neurological: She is alert and oriented to person, place, and time. Coordination normal.  Skin: Skin is warm and  dry. No rash noted.  Psychiatric: She has a normal mood and affect. Her behavior is normal.      Assessment & Plan:   Taniah was seen today for follow-up and chest pain.  Diagnoses and all orders for this visit:  Essential hypertension, benign  Cough -     DG Chest 2 View; Future  Other orders -     losartan-hydrochlorothiazide (HYZAAR) 100-25 MG tablet; Take 1 tablet by mouth daily.   I have discontinued Ms. Sevey's olmesartan-hydrochlorothiazide. I am also having her start on losartan-hydrochlorothiazide. Additionally, I am having her maintain her atenolol and azithromycin.  Meds ordered this encounter  Medications  . losartan-hydrochlorothiazide (HYZAAR) 100-25 MG tablet    Sig: Take 1 tablet by mouth daily.    Dispense:  90 tablet    Refill:  3    Appropriate red flag conditions were discussed with the patient as well as actions that should be taken.  Patient expressed his understanding.  Follow-up: Return if symptoms worsen or fail to improve.  Carmelina Dane, MD  [facility] reading (PRIMARY) by  Dr. Dareen Piano  negative.

## 2015-05-21 ENCOUNTER — Ambulatory Visit: Payer: Self-pay | Admitting: Internal Medicine

## 2015-05-23 ENCOUNTER — Ambulatory Visit (INDEPENDENT_AMBULATORY_CARE_PROVIDER_SITE_OTHER): Payer: Medicare Other | Admitting: Internal Medicine

## 2015-05-23 ENCOUNTER — Encounter: Payer: Self-pay | Admitting: Internal Medicine

## 2015-05-23 VITALS — BP 168/90 | HR 65 | Temp 98.1°F | Resp 16 | Wt 181.0 lb

## 2015-05-23 DIAGNOSIS — Z833 Family history of diabetes mellitus: Secondary | ICD-10-CM

## 2015-05-23 DIAGNOSIS — I10 Essential (primary) hypertension: Secondary | ICD-10-CM | POA: Diagnosis not present

## 2015-05-23 DIAGNOSIS — E663 Overweight: Secondary | ICD-10-CM | POA: Insufficient documentation

## 2015-05-23 NOTE — Progress Notes (Signed)
Subjective:    Patient ID: Megan Robbins, female    DOB: [birth year], 79 y.o.   MRN: [id number]  HPI She is here to establish with a new pcp.  She has been having a cough/cold symptoms.   Hypertension: She is taking her medication daily. She is compliant with a low sodium diet.  She denies chest pain, edema, shortness of breath and regular headaches. She is not exercising regularly.  She does monitor her blood pressure at home and typically gets 140/80.  She typically has high blood pressure when she comes to the doctor's office.    Cough, cold symptoms:  Last month she started having a cough, runny nose and post nasal drip.  She was prescribed a zpak and she is unsure if that helped. She took Claritin for several days, but stopped because it did not help.  She still has the same symptoms.    Medications and allergies reviewed with patient and updated.  Patient Active Problem List   Diagnosis Date Noted  . Overweight 05/23/2015  . Essential hypertension, benign 05/08/2015    Past Medical History  Diagnosis Date  . Hypertension   . Anxiety     Past Surgical History  Procedure Laterality Date  . Foot surgery    . Heel spur surgery    . Abdominal hysterectomy    . Tonsillectomy    . Appendectomy      Social History   Social History  . Marital Status: Married    Spouse Name: N/A  . Number of Children: N/A  . Years of Education: N/A   Social History Main Topics  . Smoking status: Never Smoker   . Smokeless tobacco: None  . Alcohol Use: No  . Drug Use: No  . Sexual Activity: Yes    Birth Control/ Protection: Post-menopausal   Other Topics Concern  . None   Social History Narrative   No regular exercise    Review of Systems  Constitutional: Negative for fever and chills.  HENT: Positive for congestion, rhinorrhea and sinus pressure. Negative for ear pain and sore throat.   Eyes: Negative for visual disturbance.  Respiratory: Positive for cough. Negative  for shortness of breath and wheezing.   Cardiovascular: Positive for palpitations (anxiety or caffeine). Negative for chest pain and leg swelling.  Gastrointestinal: Negative for nausea, abdominal pain, diarrhea, constipation and blood in stool.       No GERD  Endocrine: Negative for polydipsia and polyphagia.  Genitourinary: Negative for dysuria and hematuria.  Musculoskeletal: Positive for back pain (muscular). Negative for arthralgias.  Neurological: Negative for dizziness, light-headedness and headaches.  Psychiatric/Behavioral: Negative for dysphoric mood. The patient is nervous/anxious.        Objective:   Filed Vitals:   05/23/15 0801  BP: 168/90  Pulse: 65  Temp: 98.1 F (36.7 C)  Resp: 16   Filed Weights   05/23/15 0801  Weight: 181 lb (82.101 kg)   Body mass index is 32.07 kg/(m^2).   Physical Exam  Constitutional: She is oriented to person, place, and time. She appears well-developed and well-nourished. No distress.  HENT:  Head: Normocephalic and atraumatic.  Right Ear: External ear normal.  Left Ear: External ear normal.  Nose: Nose normal.  Mouth/Throat: Oropharynx is clear and moist. No oropharyngeal exudate.  Neck: Neck supple. No JVD present. No tracheal deviation present. No thyromegaly present.  No carotid bruit  Cardiovascular: Normal rate and normal heart sounds.   No murmur heard. Pulmonary/Chest:  Effort normal and breath sounds normal. No respiratory distress. She has no wheezes.  Abdominal: Soft. She exhibits no distension. There is no tenderness.  Musculoskeletal: She exhibits no edema.  Lymphadenopathy:    She has no cervical adenopathy.  Neurological: She is alert and oriented to person, place, and time.  Skin: Skin is warm and dry.  Psychiatric: She has a normal mood and affect. Her behavior is normal.          Assessment & Plan:   See problem list   Blood work ordered, including a1c given family history of dm  Follow up in 3-4  weeks for nurse visit to recheck BP, she will bring her home bp cuff  Follow up with me in 6 months

## 2015-05-23 NOTE — Progress Notes (Signed)
Pre visit review using our clinic review tool, if applicable. No additional management support is needed unless otherwise documented below in the visit note. 

## 2015-05-23 NOTE — Assessment & Plan Note (Signed)
Stressed the importance of regular exercise and decreasing portions/healthy diet Discussed increased risk of several specific diseases with being overweight

## 2015-05-23 NOTE — Patient Instructions (Addendum)
   Test(s) ordered today. Your results will be released to MyChart (or called to you) after review, usually within 72hours after test completion. If any changes need to be made, you will be notified at that same time.  An ultrasound of your heart (Echo) was ordered - you will receive a call from our office to schedule this.   No immunizations administered today.   Medications reviewed and updated.  No changes recommended at this time.  You can start taking zyrtec daily for your cold/allergy symptoms.   Please schedule followup in 6 months with me and in 3-4 weeks with the nurse to recheck your blood pressure - please bring in your BP cuff from home.

## 2015-05-23 NOTE — Assessment & Plan Note (Signed)
Not controlled here today, looks like it has been elevated in prior visits She states it is well controlled at home No change in medication today cmp and basic blood work ordered She will continue to monitor at home Start regular exercise Work on weight loss She is compliant with a low sodium diet Follow up in 3-4 weeks for a nurse visit for bp check - she will bring her cuff Follow up with me in 6 months

## 2015-09-25 ENCOUNTER — Ambulatory Visit (INDEPENDENT_AMBULATORY_CARE_PROVIDER_SITE_OTHER): Payer: Medicare Other | Admitting: Internal Medicine

## 2015-09-25 ENCOUNTER — Encounter: Payer: Self-pay | Admitting: Internal Medicine

## 2015-09-25 VITALS — BP 132/74 | HR 71 | Temp 98.6°F | Resp 20 | Wt 175.0 lb

## 2015-09-25 DIAGNOSIS — I10 Essential (primary) hypertension: Secondary | ICD-10-CM

## 2015-09-25 DIAGNOSIS — J069 Acute upper respiratory infection, unspecified: Secondary | ICD-10-CM | POA: Diagnosis not present

## 2015-09-25 MED ORDER — HYDROCODONE-HOMATROPINE 5-1.5 MG/5ML PO SYRP
5.0000 mL | ORAL_SOLUTION | Freq: Four times a day (QID) | ORAL | Status: DC | PRN
Start: 2015-09-25 — End: 2016-04-01

## 2015-09-25 MED ORDER — LEVOFLOXACIN 250 MG PO TABS: 250.0000 mg | ORAL_TABLET | Freq: Every day | ORAL | Status: DC

## 2015-09-25 NOTE — Progress Notes (Signed)
Pre visit review using our clinic review tool, if applicable. No additional management support is needed unless otherwise documented below in the visit note. 

## 2015-09-25 NOTE — Patient Instructions (Signed)
Please take all new medication as prescribed - the antibiotic, and cough medicine as needed  Please continue all other medications as before, and refills have been done if requested.  Please have the pharmacy call with any other refills you may need.  Please keep your appointments with your specialists as you may have planned   

## 2015-09-26 NOTE — Progress Notes (Signed)
   Subjective:    Patient ID: Megan Robbins, female    DOB: 1936/02/23, 80 y.o.   MRN: [id number]  HPI   Here with 2-3 days acute onset fever, facial pain, pressure, headache, general weakness and malaise, and greenish d/c, with mild ST and cough, but pt denies chest pain, wheezing, increased sob or doe, orthopnea, PND, increased LE swelling, palpitations, dizziness or syncope.  Pt denies new neurological symptoms such as new headache, or facial or extremity weakness or numbness  Past Medical History  Diagnosis Date  . Hypertension   . Anxiety    Past Surgical History  Procedure Laterality Date  . Foot surgery    . Heel spur surgery    . Abdominal hysterectomy    . Tonsillectomy    . Appendectomy      reports that she has never smoked. She does not have any smokeless tobacco history on file. She reports that she does not drink alcohol or use illicit drugs. family history includes Diabetes in her daughter and son; Hyperlipidemia in her mother; Hypertension in her mother. No Known Allergies Current Outpatient Prescriptions on File Prior to Visit  Medication Sig Dispense Refill  . atenolol (TENORMIN) 50 MG tablet Take 1 tablet (50 mg total) by mouth daily. 90 tablet 3  . ipratropium (ATROVENT) 0.03 % nasal spray USE 2 SPRAYS IN NOSTRILS 4 TIMES DAILY  0  . losartan-hydrochlorothiazide (HYZAAR) 100-25 MG tablet Take 1 tablet by mouth daily. 90 tablet 3   No current facility-administered medications on file prior to visit.   Review of Systems  All otherwise neg per pt     Objective:   Physical Exam BP 132/74 mmHg  Pulse 71  Temp(Src) 98.6 F (37 C) (Oral)  Resp 20  Wt 175 lb (79.379 kg)  SpO2 98% VS noted,  Constitutional: Pt appears in no significant distress HENT: Head: NCAT.  Right Ear: External ear normal.  Left Ear: External ear normal.  Eyes: . Pupils are equal, round, and reactive to light. Conjunctivae and EOM are normal Bilat tm's with mild erythema.  Max sinus  areas mild tender.  Pharynx with mild erythema, no exudate Neck: Normal range of motion. Neck supple.  Cardiovascular: Normal rate and regular rhythm.   Pulmonary/Chest: Effort normal and breath sounds without rales or wheezing.  Neurological: Pt is alert. Not confused , motor grossly intact Skin: Skin is warm. No rash, no LE edema Psychiatric: Pt behavior is normal. No agitation.     Assessment & Plan:

## 2015-09-26 NOTE — Assessment & Plan Note (Signed)
Mild to mod, for antibx course,  to f/u any worsening symptoms or concerns 

## 2015-09-26 NOTE — Assessment & Plan Note (Signed)
stable overall by history and exam, recent data reviewed with pt, and pt to continue medical treatment as before,  to f/u any worsening symptoms or concerns BP Readings from Last 3 Encounters:  09/25/15 132/74  05/23/15 168/90  05/08/15 180/100

## 2015-11-21 ENCOUNTER — Ambulatory Visit: Payer: Self-pay | Admitting: Internal Medicine

## 2015-11-27 IMAGING — CR DG LUMBAR SPINE COMPLETE 4+V
5 series · 5 of 5 positions shown · non-contrast
Comparison: Concurrently obtained radiographs of the right hip

CLINICAL DATA: 78-year-old female with left lower back pain
radiating to the right and down the right thigh

EXAM:
LUMBAR SPINE - COMPLETE 4+ VIEW

[AP]
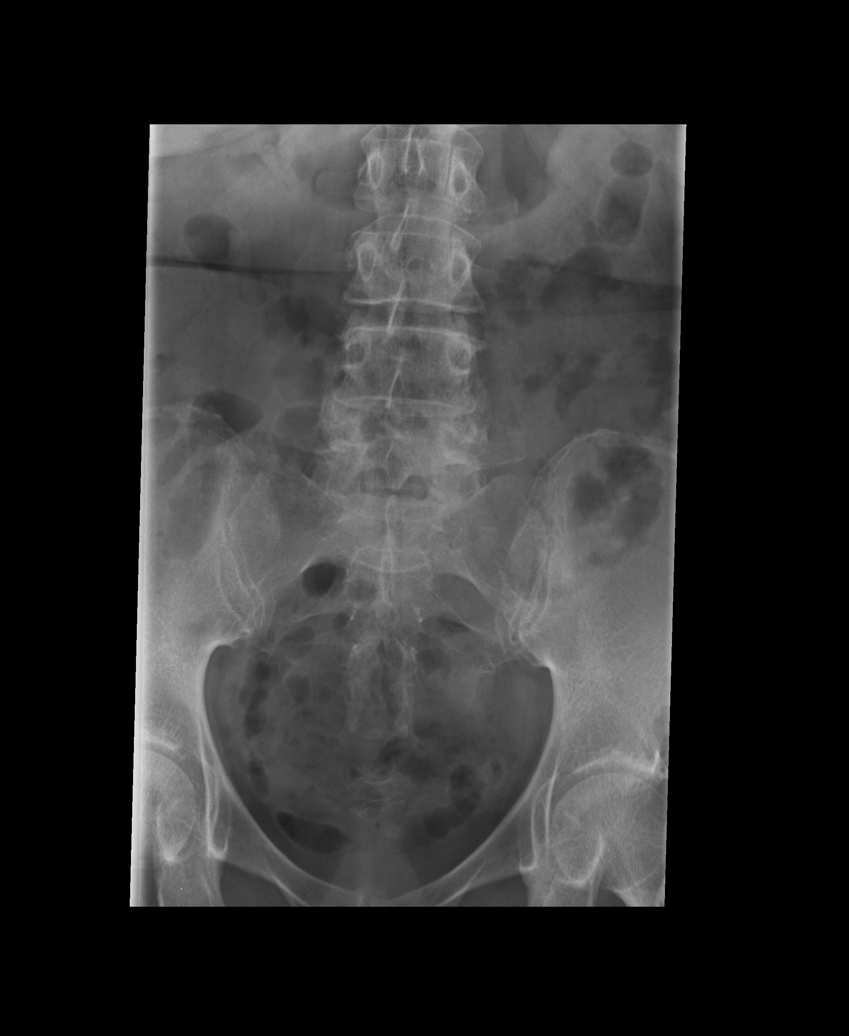

[rpo]
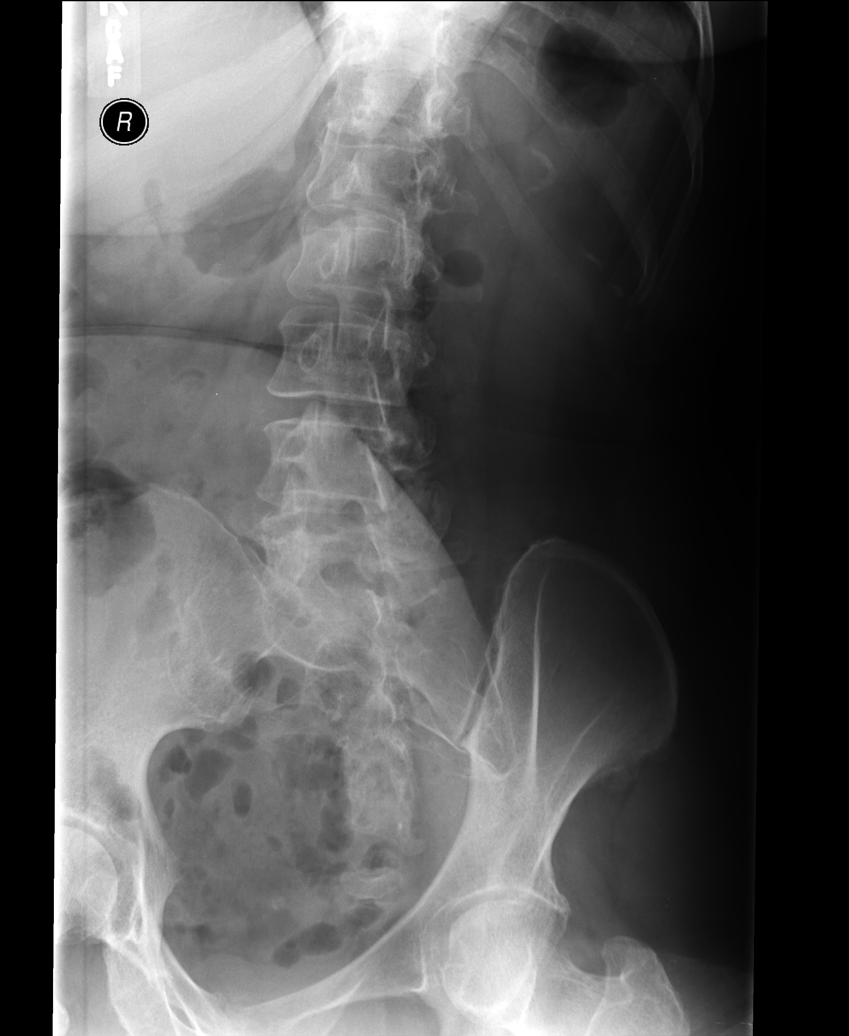

[lpo]
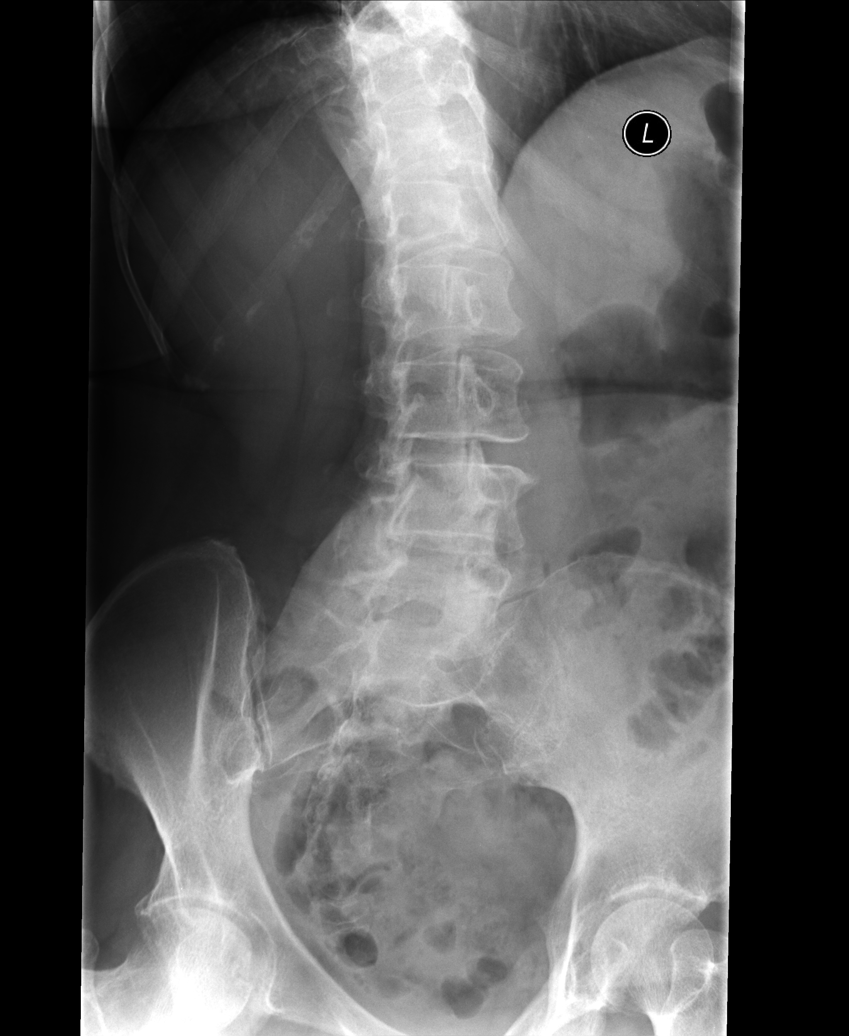

[lateral]
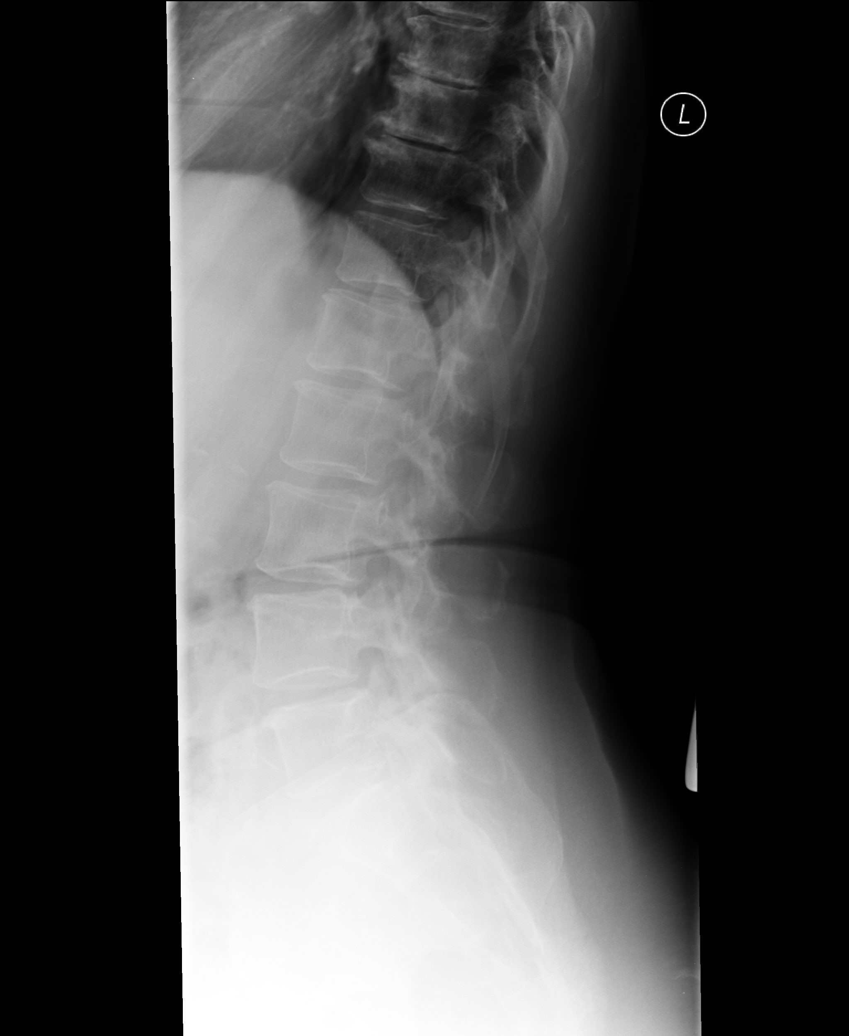

[l5 s1]
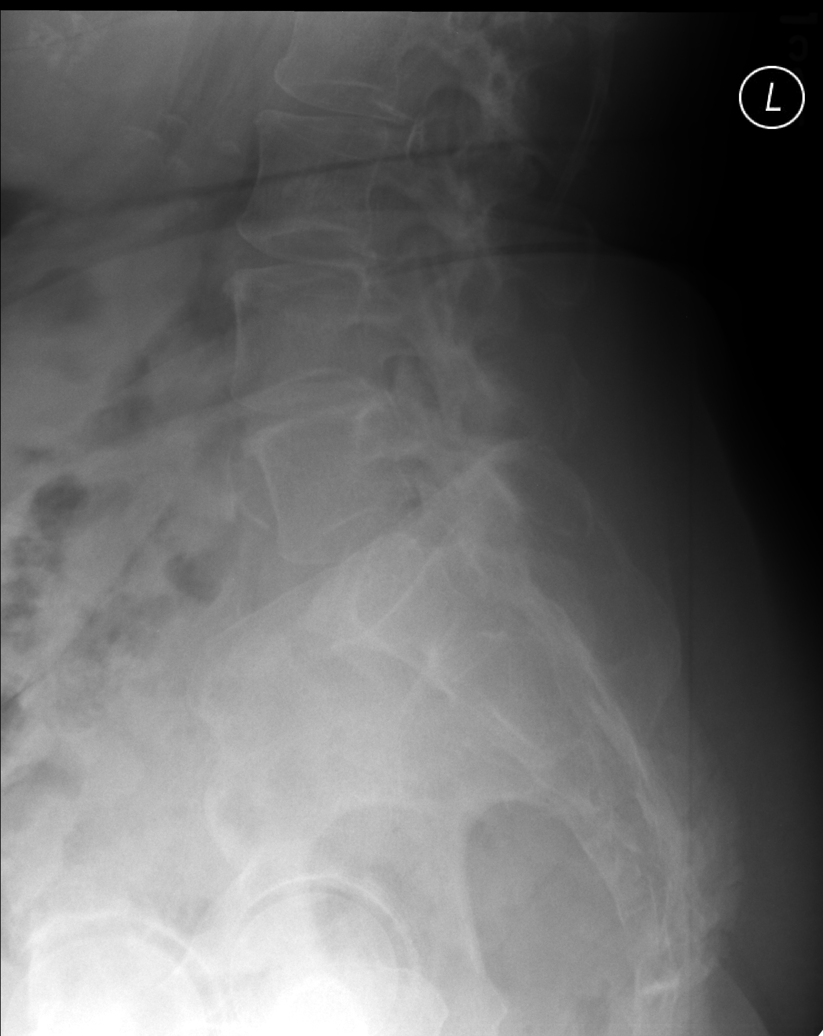

[5 of 5 positions shown; findings below may reference images not displayed]

FINDINGS: No evidence of acute fracture or malalignment. Vertebral body
heights are maintained. Very mild changes of degenerative disc
disease throughout the spine. Mild L5-S1 facet arthropathy. Normal
bony mineralization. No lytic or blastic osseous lesions. Visualized
bowel gas pattern is unremarkable.
IMPRESSION: 1. No acute abnormality or malalignment.
2. Mild multilevel degenerative disc disease.
3. L5-S1 facet arthropathy, also mild.

## 2016-02-25 ENCOUNTER — Other Ambulatory Visit: Payer: Self-pay

## 2016-02-25 DIAGNOSIS — I10 Essential (primary) hypertension: Secondary | ICD-10-CM

## 2016-02-25 MED ORDER — ATENOLOL 50 MG PO TABS: 50.0000 mg | ORAL_TABLET | Freq: Every day | ORAL | 0 refills | Status: DC

## 2016-03-11 ENCOUNTER — Telehealth: Payer: Self-pay | Admitting: Emergency Medicine

## 2016-03-11 MED ORDER — LOSARTAN POTASSIUM-HCTZ 100-25 MG PO TABS: 1.0000 | ORAL_TABLET | Freq: Every day | ORAL | 0 refills | Status: DC

## 2016-03-11 NOTE — Telephone Encounter (Signed)
Pt states she has made appt for late august but she is out of her BP medications. Needing refill sent to pharmacy until appt. Sent 30 day supply to [facility]...Raechel Chute/lmb

## 2016-04-01 ENCOUNTER — Ambulatory Visit (INDEPENDENT_AMBULATORY_CARE_PROVIDER_SITE_OTHER): Payer: Medicare Other | Admitting: Internal Medicine

## 2016-04-01 ENCOUNTER — Encounter: Payer: Self-pay | Admitting: Internal Medicine

## 2016-04-01 VITALS — BP 166/90 | HR 59 | Temp 97.6°F | Resp 16 | Wt 178.0 lb

## 2016-04-01 DIAGNOSIS — I10 Essential (primary) hypertension: Secondary | ICD-10-CM

## 2016-04-01 MED ORDER — LOSARTAN POTASSIUM-HCTZ 100-25 MG PO TABS: 1.0000 | ORAL_TABLET | Freq: Every day | ORAL | 1 refills | Status: DC

## 2016-04-01 MED ORDER — ATENOLOL 50 MG PO TABS: 50.0000 mg | ORAL_TABLET | Freq: Every day | ORAL | 1 refills | Status: DC

## 2016-04-01 NOTE — Assessment & Plan Note (Signed)
BP elevated today likely related to increased stress and not taking her BP medications until just now Controlled at home Continue regular exercise and low sodium diet Continue current medications at current doses

## 2016-04-01 NOTE — Progress Notes (Signed)
Pre visit review using our clinic review tool, if applicable. No additional management support is needed unless otherwise documented below in the visit note. 

## 2016-04-01 NOTE — Patient Instructions (Addendum)

## 2016-04-01 NOTE — Progress Notes (Signed)
Subjective:    Patient ID: Megan Robbins, female    DOB: [birth year], 80 y.o.   MRN: [id number]  HPI The patient is here for follow up.  She has had increased stress recently and thinks that has affected her BP today.  She did not take her BP medication until just now.   Hypertension: She is taking her medication daily. She is compliant with a low sodium diet.  She denies chest pain, palpitations, edema, shortness of breath and regular headaches. She is exercising - she has been trying to walk.  She does monitor her blood pressure at home and it has been 130/70's.     Medications and allergies reviewed with patient and updated if appropriate.  Patient Active Problem List   Diagnosis Date Noted  . Overweight 05/23/2015  . Essential hypertension, benign 05/08/2015    Current Outpatient Prescriptions on File Prior to Visit  Medication Sig Dispense Refill  . atenolol (TENORMIN) 50 MG tablet Take 1 tablet (50 mg total) by mouth daily. 30 tablet 0  . ipratropium (ATROVENT) 0.03 % nasal spray USE 2 SPRAYS IN NOSTRILS 4 TIMES DAILY  0  . losartan-hydrochlorothiazide (HYZAAR) 100-25 MG tablet Take 1 tablet by mouth daily. Must keep 04/01/16 appt for future refills 30 tablet 0   No current facility-administered medications on file prior to visit.     Past Medical History:  Diagnosis Date  . Anxiety   . Hypertension     Past Surgical History:  Procedure Laterality Date  . ABDOMINAL HYSTERECTOMY    . APPENDECTOMY    . FOOT SURGERY    . HEEL SPUR SURGERY    . TONSILLECTOMY      Social History   Social History  . Marital status: Married    Spouse name: N/A  . Number of children: N/A  . Years of education: N/A   Social History Main Topics  . Smoking status: Never Smoker  . Smokeless tobacco: None  . Alcohol use No  . Drug use: No  . Sexual activity: Yes    Birth control/ protection: Post-menopausal   Other Topics Concern  . None   Social History Narrative   No  regular exercise    Family History  Problem Relation Age of Onset  . Hyperlipidemia Mother   . Hypertension Mother   . Diabetes Daughter   . Diabetes Son     Review of Systems  Constitutional: Negative for appetite change and fever.  Respiratory: Negative for cough, shortness of breath and wheezing.   Cardiovascular: Negative for chest pain, palpitations and leg swelling.  Neurological: Negative for dizziness, light-headedness and headaches.       Objective:   Vitals:   04/01/16 1107  BP: (!) 166/90  Pulse: (!) 59  Resp: 16  Temp: 97.6 F (36.4 C)   Filed Weights   04/01/16 1107  Weight: 178 lb (80.7 kg)   Body mass index is 31.53 kg/m.   Physical Exam    Constitutional: Appears well-developed and well-nourished. No distress.  HENT:  Head: Normocephalic and atraumatic.  Neck: Neck supple. No tracheal deviation present. No thyromegaly present.  Cardiovascular: Normal rate, regular rhythm and normal heart sounds.   2/6 systolic murmur heard. No carotid bruit  Pulmonary/Chest: Effort normal and breath sounds normal. No respiratory distress. No has no wheezes. No rales.  Musculoskeletal: No edema.  Lymphadenopathy: No cervical adenopathy.  Skin: Skin is warm and dry. Not diaphoretic.  Psychiatric: Normal mood and affect. Behavior  is normal.     Assessment & Plan:    See Problem List for Assessment and Plan of chronic medical problems.   F/u in 6 months

## 2016-04-16 ENCOUNTER — Encounter: Payer: Self-pay | Admitting: Internal Medicine

## 2016-04-16 NOTE — Telephone Encounter (Signed)
Patient Name: Dallas SchimkeEARL Petrizzo  DOB: 1936-03-27    Initial Comment caller states she has diarrhea   Nurse Assessment  Nurse: Stefano GaulStringer, RN, Dwana CurdVera Date/Time (Eastern Time): 04/16/2016 11:32:50 AM  Confirm and document reason for call. If symptomatic, describe symptoms. You must click the next button to save text entered. ---Caller states she has had diarrhea since Sunday. Felt bloated. No vomiting. had some abd cramping last week but not now. Pharmacist recommended immodium. had 1 diarrhea stool yesterday and had 2 regular BMs and then had 1 diarrhea stools. She is urinating and drinking fluids.  Has the patient traveled out of the country within the last 30 days? ---No  Does the patient have any new or worsening symptoms? ---Yes  Will a triage be completed? ---Yes  Related visit to physician within the last 2 weeks? ---No  Does the PT have any chronic conditions? (i.e. diabetes, asthma, etc.) ---Yes  List chronic conditions. ---HTN  Is this a behavioral health or substance abuse call? ---No     Guidelines    Guideline Title Affirmed Question Affirmed Notes  Diarrhea MILD-MODERATE diarrhea (e.g., 1-6 times / day more than normal) (all triage questions negative)    Final Disposition User   [facility], [place]    Comments  Called back line and spoke to NordheimStephanie regarding wrong pt information was put into pt's chart. She will chart note as null and void.   Disagree/Comply: Comply

## 2016-04-16 NOTE — Telephone Encounter (Signed)
Patient Name: Megan Robbins  °DOB: 10/29/1933   ° °Initial Comment Caller states she is having chest pain, like soreness, especially with deep breaths  ° °Nurse Assessment  °Nurse: Stringer, RN, Vera Date/Time (Eastern Time): 04/16/2016 11:46:09 AM  °Confirm and document reason for call. If symptomatic, describe symptoms. You must click the next button to save text entered. ---Caller states she is having chest soreness. Soreness gets worse when she takes a deep breath. Area is in her upper right chest area under her armpit. pain worsens when she lifts up her arm. No fever. No cough or nasal congestion. No SOB. Soreness started last week after she did some heavy lifting. Has appt tomorrow morning.  °Has the patient traveled out of the country within the last 30 days? ---Not Applicable  °Does the patient have any new or worsening symptoms? ---Yes  °Will a triage be completed? ---Yes  °Related visit to physician within the last 2 weeks? ---No  °Does the PT have any chronic conditions? (i.e. diabetes, asthma, etc.) ---No  °Is this a behavioral health or substance abuse call? ---No  °  ° °Guidelines    °Guideline Title Affirmed Question Affirmed Notes  °Chest Pain Taking a deep breath makes pain worse   ° °Final Disposition User   °Go to ED Now (or PCP triage) Stringer, RN, Vera   ° °Comments  °Pt states she already has appt at 8:30 am on 04/17/16 with Dr. Eric Sonnenberg. She does not want to go to the ER as she feels fine other than the chest soreness and the fact it hurts worse when she takes a deep breath.  °called back line and spoke to Jessica and gave report that pt is having upper right chest pain which worsens when she takes a deep breath. Triage outcome of go to ER now (or PCP triage). Has appt at 8:30 am and does not want to go to the ER today.  ° °Referrals  °GO TO FACILITY REFUSED  ° °Disagree/Comply: Disagree  °Disagree/Comply Reason: Disagree with instructions  ° ° °

## 2016-04-16 NOTE — Telephone Encounter (Signed)
Please disregard...charted in wrong chart per team health RN.

## 2016-04-28 ENCOUNTER — Ambulatory Visit (INDEPENDENT_AMBULATORY_CARE_PROVIDER_SITE_OTHER): Payer: Medicare Other | Admitting: Podiatry

## 2016-04-28 ENCOUNTER — Encounter: Payer: Self-pay | Admitting: Podiatry

## 2016-04-28 ENCOUNTER — Ambulatory Visit: Payer: Self-pay

## 2016-04-28 DIAGNOSIS — M722 Plantar fascial fibromatosis: Secondary | ICD-10-CM

## 2016-04-28 DIAGNOSIS — M79671 Pain in right foot: Secondary | ICD-10-CM

## 2016-04-28 DIAGNOSIS — M79672 Pain in left foot: Secondary | ICD-10-CM | POA: Diagnosis not present

## 2016-04-28 MED ORDER — BETAMETHASONE SOD PHOS & ACET 6 (3-3) MG/ML IJ SUSP: 12.0000 mg | Freq: Once | INTRAMUSCULAR | Status: DC

## 2016-04-28 MED ORDER — NONFORMULARY OR COMPOUNDED ITEM: 1.0000 g | Freq: Four times a day (QID) | 2 refills | Status: DC

## 2016-04-28 NOTE — Progress Notes (Signed)
   Subjective:    Patient ID: Megan Robbins, female    DOB: February 04, 1936, 80 y.o.   MRN: [id number]  HPI    Review of Systems  Allergic/Immunologic: Positive for environmental allergies.  All other systems reviewed and are negative.      Objective:   Physical Exam        Assessment & Plan:

## 2016-04-28 NOTE — Progress Notes (Signed)
Patient ID: Megan Robbins, female   DOB: 04-Mar-1936, 80 y.o.   MRN: [id number]  Subjective: Patient presents today for bilateral heel pain which is been going on for years. The patient is to be under the care of Dr. Ralene CorkSikora, however surgery was mentioned to the patient and the patient never followed back up with Dr. Ralene CorkSikora. Patient states that her pain is been severe and she is unable to ambulate and so she presents today for further treatment and evaluation.  Patient also states that she did have previous surgery to the left heel due to chronic plantar fasciitis with heel spur resection.  Objective: Physical Exam General: The patient is alert and oriented x3 in no acute distress.  Dermatology: Skin is warm, dry and supple bilateral lower extremities. Negative for open lesions or macerations bilateral.   Vascular: Dorsalis Pedis and Posterior Tibial pulses palpable bilateral.  Capillary fill time is immediate to all digits.  Neurological: Epicritic and protective threshold intact bilateral.   Musculoskeletal: Tenderness to palpation at the medial calcaneal tubercale and through the insertion of the plantar fascia of the bilateral feet. All other joints range of motion within normal limits bilateral. Strength 5/5 in all groups bilateral.   Radiographic exam:   Normal osseous mineralization. Joint spaces preserved. No fracture/dislocation/boney destruction. Calcaneal spur present with mild thickening of plantar fascia bilateral. No other soft tissue abnormalities or radiopaque foreign bodies.   Assessment: #1 plantar fasciitis bilateral #2 pain in bilateral heels  Problem List Items Addressed This Visit    None    Visit Diagnoses    Bilateral foot pain    -  Primary   Relevant Orders   DG Foot Complete Left   DG Foot Complete Right      Plan of Care:   1. Patient evaluated. Xrays reviewed.   2. Injection of 0.5cc Celestone soluspan injected into the left heel.  3. Instructed  patient regarding therapies and modalities at home to alleviate symptoms.  4. Rx for anti-inflammatory pain cream dispensed through [facility] pharmacy 5. Plantar fascial band(s) dispensed bilaterally. 6. Return to clinic in 4 weeks.

## 2016-04-28 NOTE — Patient Instructions (Signed)
Plantar Fasciitis Plantar fasciitis is a painful foot condition that affects the heel. It occurs when the band of tissue that connects the toes to the heel bone (plantar fascia) becomes irritated. This can happen after exercising too much or doing other repetitive activities (overuse injury). The pain from plantar fasciitis can range from mild irritation to severe pain that makes it difficult for you to walk or move. The pain is usually worse in the morning or after you have been sitting or lying down for a while. CAUSES This condition may be caused by:  Standing for long periods of time.  Wearing shoes that do not fit.  Doing high-impact activities, including running, aerobics, and ballet.  Being overweight.  Having an abnormal way of walking (gait).  Having tight calf muscles.  Having high arches in your feet.  Starting a new athletic activity. SYMPTOMS The main symptom of this condition is heel pain. Other symptoms include:  Pain that gets worse after activity or exercise.  Pain that is worse in the morning or after resting.  Pain that goes away after you walk for a few minutes. DIAGNOSIS This condition may be diagnosed based on your signs and symptoms. Your health care provider will also do a physical exam to check for:  A tender area on the bottom of your foot.  A high arch in your foot.  Pain when you move your foot.  Difficulty moving your foot. You may also need to have imaging studies to confirm the diagnosis. These can include:  X-rays.  Ultrasound.  MRI. TREATMENT  Treatment for plantar fasciitis depends on the severity of the condition. Your treatment may include:  Rest, ice, and over-the-counter pain medicines to manage your pain.  Exercises to stretch your calves and your plantar fascia.  A splint that holds your foot in a stretched, upward position while you sleep (night splint).  Physical therapy to relieve symptoms and prevent problems in the  future.  Cortisone injections to relieve severe pain.  Extracorporeal shock wave therapy (ESWT) to stimulate damaged plantar fascia with electrical impulses. It is often used as a last resort before surgery.  Surgery, if other treatments have not worked after 12 months. HOME CARE INSTRUCTIONS  Take medicines only as directed by your health care provider.  Avoid activities that cause pain.  Roll the bottom of your foot over a bag of ice or a bottle of cold water. Do this for 20 minutes, 3-4 times a day.  Perform simple stretches as directed by your health care provider.  Try wearing athletic shoes with air-sole or gel-sole cushions or soft shoe inserts.  Wear a night splint while sleeping, if directed by your health care provider.  Keep all follow-up appointments with your health care provider. PREVENTION   Do not perform exercises or activities that cause heel pain.  Consider finding low-impact activities if you continue to have problems.  Lose weight if you need to. The best way to prevent plantar fasciitis is to avoid the activities that aggravate your plantar fascia. SEEK MEDICAL CARE IF:  Your symptoms do not go away after treatment with home care measures.  Your pain gets worse.  Your pain affects your ability to move or do your daily activities.   This information is not intended to replace advice given to you by your health care provider. Make sure you discuss any questions you have with your health care provider.   Document Released: 04/15/2001 Document Revised: 04/11/2015 Document Reviewed: 05/31/2014 Elsevier   Interactive Patient Education 2016 [employer]  

## 2016-05-26 ENCOUNTER — Ambulatory Visit (INDEPENDENT_AMBULATORY_CARE_PROVIDER_SITE_OTHER): Payer: Medicare Other | Admitting: Podiatry

## 2016-05-26 ENCOUNTER — Encounter: Payer: Self-pay | Admitting: Podiatry

## 2016-05-26 DIAGNOSIS — M722 Plantar fascial fibromatosis: Secondary | ICD-10-CM | POA: Diagnosis not present

## 2016-05-26 DIAGNOSIS — M79672 Pain in left foot: Secondary | ICD-10-CM

## 2016-05-26 MED ORDER — BETAMETHASONE SOD PHOS & ACET 6 (3-3) MG/ML IJ SUSP: 3.0000 mg | Freq: Once | INTRAMUSCULAR | Status: DC

## 2016-05-26 NOTE — Progress Notes (Signed)
Subjective: Patient presents today for pain and tenderness in the left foot. Patient states the foot pain has been hurting for several weeks now. Patient states that it hurts in the mornings with the first steps out of bed. Patient presents today for further treatment and evaluation  Objective: Physical Exam General: The patient is alert and oriented x3 in no acute distress.  Dermatology: Skin is warm, dry and supple bilateral lower extremities. Negative for open lesions or macerations bilateral.   Vascular: Dorsalis Pedis and Posterior Tibial pulses palpable bilateral.  Capillary fill time is immediate to all digits.  Neurological: Epicritic and protective threshold intact bilateral.   Musculoskeletal: Tenderness to palpation at the medial calcaneal tubercale and through the insertion of the plantar fascia of the left foot. All other joints range of motion within normal limits bilateral. Strength 5/5 in all groups bilateral.   Radiographic exam:   Normal osseous mineralization. Joint spaces preserved. No fracture/dislocation/boney destruction. Calcaneal spur present with mild thickening of plantar fascia left. No other soft tissue abnormalities or radiopaque foreign bodies.   Assessment: 1. Plantar fasciitis left foot 2. Pain in left foot  Plan of Care:  1. Patient evaluated. Xrays reviewed.   2. Injection of 0.5cc Celestone soluspan injected into the left plantar fascia.  3. Instructed patient regarding therapies and modalities at home to alleviate symptoms.  4. Return to clinic in 4 weeks.

## 2016-06-23 ENCOUNTER — Ambulatory Visit (INDEPENDENT_AMBULATORY_CARE_PROVIDER_SITE_OTHER): Payer: Medicare Other | Admitting: Podiatry

## 2016-06-23 DIAGNOSIS — M722 Plantar fascial fibromatosis: Secondary | ICD-10-CM

## 2016-06-23 DIAGNOSIS — M79672 Pain in left foot: Secondary | ICD-10-CM

## 2016-07-13 NOTE — Progress Notes (Signed)
Subjective: Patient presents today for follow-up evaluation of pain and tenderness in the left foot. Patient states that the pain is much better. She does not experience the pain that she used to. She does believe that the injection helped. Patient presents today for further treatment and evaluation  Objective: Physical Exam General: The patient is alert and oriented x3 in no acute distress.  Dermatology: Skin is warm, dry and supple bilateral lower extremities. Negative for open lesions or macerations bilateral.   Vascular: Dorsalis Pedis and Posterior Tibial pulses palpable bilateral.  Capillary fill time is immediate to all digits.  Neurological: Epicritic and protective threshold intact bilateral.   Musculoskeletal: Negative for Tenderness to palpation at the medial calcaneal tubercale and through the insertion of the plantar fascia of the left foot. All other joints range of motion within normal limits bilateral. Strength 5/5 in all groups bilateral.   Radiographic exam:   Normal osseous mineralization. Joint spaces preserved. No fracture/dislocation/boney destruction. Calcaneal spur present with mild thickening of plantar fascia left. No other soft tissue abnormalities or radiopaque foreign bodies.   Assessment: 1. Plantar fasciitis left foot-healed 2. Pain in left foot-healed  Plan of Care:  1. Patient evaluated. Xrays reviewed.   2. Today we discussed conservative modalities to prevent recurrence of plantar fasciitis to the left foot. Conservative modalities including stretching, ice, anti-inflammatory to alleviate symptoms.  3. Instructed patient regarding therapies and modalities at home to alleviate symptoms.  4. Return to clinic when necessary

## 2016-08-27 ENCOUNTER — Ambulatory Visit (INDEPENDENT_AMBULATORY_CARE_PROVIDER_SITE_OTHER): Payer: Medicare Other | Admitting: Podiatry

## 2016-08-27 DIAGNOSIS — M79672 Pain in left foot: Secondary | ICD-10-CM

## 2016-08-27 DIAGNOSIS — M79671 Pain in right foot: Secondary | ICD-10-CM | POA: Diagnosis not present

## 2016-08-27 DIAGNOSIS — M722 Plantar fascial fibromatosis: Secondary | ICD-10-CM | POA: Diagnosis not present

## 2016-08-27 DIAGNOSIS — M79673 Pain in unspecified foot: Secondary | ICD-10-CM | POA: Diagnosis not present

## 2016-09-07 MED ORDER — BETAMETHASONE SOD PHOS & ACET 6 (3-3) MG/ML IJ SUSP: 3.0000 mg | Freq: Once | INTRAMUSCULAR | Status: DC

## 2016-09-07 NOTE — Progress Notes (Signed)
   Subjective: Patient presents today for pain and tenderness in the feet bilaterally. Patient states the foot pain has been hurting for several weeks now. Patient states that it hurts in the mornings with the first steps out of bed. Patient presents today for further treatment and evaluation  Objective: Physical Exam General: The patient is alert and oriented x3 in no acute distress.  Dermatology: Skin is warm, dry and supple bilateral lower extremities. Negative for open lesions or macerations bilateral.   Vascular: Dorsalis Pedis and Posterior Tibial pulses palpable bilateral.  Capillary fill time is immediate to all digits.  Neurological: Epicritic and protective threshold intact bilateral.   Musculoskeletal: Tenderness to palpation at the medial calcaneal tubercale and through the insertion of the plantar fascia of the bilateral feet. All other joints range of motion within normal limits bilateral. Strength 5/5 in all groups bilateral.   Radiographic exam: Normal osseous mineralization. Joint spaces preserved. No fracture/dislocation/boney destruction. Calcaneal spur present with mild thickening of plantar fascia bilateral. No other soft tissue abnormalities or radiopaque foreign bodies.   Assessment: #1 plantar fasciitis bilateral feet #2 pain in bilateral feet  Plan of Care:  1. Patient evaluated. Xrays reviewed.   2. Injection of 0.5cc Celestone soluspan injected into the bilateral heels.  3. Instructed patient regarding therapies and modalities at home to alleviate symptoms.  4.  Plantar fascial band(s) dispensed for bilateral plantar fasciitis. 5. Return to clinic in 4 weeks.    Felecia ShellingBrent M. Evans, DPM [facility]  Dr. Felecia ShellingBrent M. Evans, DPM    [address]                                        [place], [place] [address]                Office [phone]  Fax [phone]

## 2016-09-16 ENCOUNTER — Ambulatory Visit (INDEPENDENT_AMBULATORY_CARE_PROVIDER_SITE_OTHER): Payer: Medicare Other | Admitting: Nurse Practitioner

## 2016-09-16 VITALS — BP 148/90 | HR 88 | Temp 98.2°F | Ht 63.0 in | Wt 175.0 lb

## 2016-09-16 DIAGNOSIS — J069 Acute upper respiratory infection, unspecified: Secondary | ICD-10-CM | POA: Diagnosis not present

## 2016-09-16 MED ORDER — AZITHROMYCIN 250 MG PO TABS: 250.0000 mg | ORAL_TABLET | Freq: Every day | ORAL | 0 refills | Status: DC

## 2016-09-16 MED ORDER — DM-GUAIFENESIN ER 30-600 MG PO TB12: 1.0000 | ORAL_TABLET | Freq: Two times a day (BID) | ORAL | 0 refills | Status: DC | PRN

## 2016-09-16 MED ORDER — FLUTICASONE PROPIONATE 50 MCG/ACT NA SUSP: 2.0000 | Freq: Every day | NASAL | 0 refills | Status: DC

## 2016-09-16 MED ORDER — HYDROCODONE-HOMATROPINE 5-1.5 MG/5ML PO SYRP: 5.0000 mL | ORAL_SOLUTION | Freq: Every evening | ORAL | 0 refills | Status: DC | PRN

## 2016-09-16 MED ORDER — OXYMETAZOLINE HCL 0.05 % NA SOLN: 1.0000 | Freq: Two times a day (BID) | NASAL | 0 refills | Status: DC

## 2016-09-16 NOTE — Progress Notes (Signed)
Pre visit review using our clinic review tool, if applicable. No additional management support is needed unless otherwise documented below in the visit note. 

## 2016-09-16 NOTE — Patient Instructions (Addendum)
URI Instructions: Flonase and Afrin use: apply 1spray of afrin in each nare, wait , then apply 2sprays of flonase in each nare. Use both nasal spray consecutively x 3days, then flonase only for at least 14days.  Encourage adequate oral hydration.  Use over-the-counter  "cold" medicines  such as "Tylenol cold" , "Advil cold",  "Mucinex" or" Mucinex DM"  for cough and congestion.  Avoid decongestants if you have high blood pressure. Use" Delsym" or" Robitussin" cough syrup varietis for cough.  You can use plain "Tylenol" or "Advi"l for fever, chills and achyness.   "Common cold" symptoms are usually triggered by a virus.  The antibiotics are usually not necessary. On average, a" viral cold" illness would take 4-7 days to resolve. Please, make an appointment if you are not better or if you're worse.  Start azithromycin if no improvement by Improvement.

## 2016-09-16 NOTE — Progress Notes (Signed)
Subjective:  Patient ID: Megan Robbins, female    DOB: May 31, 1936  Age: 81 y.o. MRN: [id number]  CC: Cough (deep cough with yellow mucus for 3 days. took OTC)   Cough  This is a new problem. The current episode started in the past 7 days. The problem has been unchanged. The problem occurs constantly. The cough is non-productive. Associated symptoms include chills, nasal congestion, postnasal drip, rhinorrhea and a sore throat. Pertinent negatives include no chest pain, ear congestion, ear pain, fever, headaches, heartburn, hemoptysis, myalgias, shortness of breath, sweats, weight loss or wheezing. The symptoms are aggravated by lying down and cold air. She has tried OTC cough suppressant for the symptoms. The treatment provided mild relief.    Outpatient Medications Prior to Visit  Medication Sig Dispense Refill  . atenolol (TENORMIN) 50 MG tablet Take 1 tablet (50 mg total) by mouth daily. 90 tablet 1  . ipratropium (ATROVENT) 0.03 % nasal spray USE 2 SPRAYS IN NOSTRILS 4 TIMES DAILY  0  . loratadine (CLARITIN) 10 MG tablet Take 10 mg by mouth daily.    Marland Kitchen losartan-hydrochlorothiazide (HYZAAR) 100-25 MG tablet Take 1 tablet by mouth daily. 90 tablet 1  . NONFORMULARY OR COMPOUNDED ITEM Apply 1-2 g topically 4 (four) times daily. 120 each 2   Facility-Administered Medications Prior to Visit  Medication Dose Route Frequency Provider Last Rate Last Dose  . betamethasone acetate-betamethasone sodium phosphate (CELESTONE) injection 12 mg  12 mg Intramuscular Once Felecia Shelling, DPM      . betamethasone acetate-betamethasone sodium phosphate (CELESTONE) injection 3 mg  3 mg Intramuscular Once Felecia Shelling, DPM      . betamethasone acetate-betamethasone sodium phosphate (CELESTONE) injection 3 mg  3 mg Intramuscular Once Felecia Shelling, DPM        ROS See HPI  Objective:  BP (!) 148/90   Pulse 88   Temp 98.2 F (36.8 C)   Ht 5\' 3"  (1.6 m)   Wt 175 lb (79.4 kg)   SpO2 96%   BMI 31.00  kg/m   BP Readings from Last 3 Encounters:  09/16/16 (!) 148/90  04/01/16 (!) 166/90  09/25/15 132/74    Wt Readings from Last 3 Encounters:  09/16/16 175 lb (79.4 kg)  04/01/16 178 lb (80.7 kg)  09/25/15 175 lb (79.4 kg)    Physical Exam  Constitutional: She is oriented to person, place, and time.  HENT:  Right Ear: Tympanic membrane, external ear and ear canal normal.  Left Ear: Tympanic membrane, external ear and ear canal normal.  Nose: Mucosal edema and rhinorrhea present. Right sinus exhibits maxillary sinus tenderness. Right sinus exhibits no frontal sinus tenderness. Left sinus exhibits maxillary sinus tenderness. Left sinus exhibits no frontal sinus tenderness.  Mouth/Throat: Uvula is midline. No trismus in the jaw. Posterior oropharyngeal erythema present. No oropharyngeal exudate.  Eyes: No scleral icterus.  Neck: Normal range of motion. Neck supple.  Cardiovascular: Normal rate and normal heart sounds.   Pulmonary/Chest: Effort normal and breath sounds normal.  Musculoskeletal: She exhibits no edema.  Lymphadenopathy:    She has no cervical adenopathy.  Neurological: She is alert and oriented to person, place, and time.  Vitals reviewed.   No results found for: WBC, HGB, HCT, PLT, GLUCOSE, CHOL, TRIG, HDL, LDLDIRECT, LDLCALC, ALT, AST, NA, K, CL, CREATININE, BUN, CO2, TSH, PSA, INR, GLUF, HGBA1C, MICROALBUR  No results found.  Assessment & Plan:   Taylin was seen today for cough.  Diagnoses and  all orders for this visit:  Acute URI -     fluticasone (FLONASE) 50 MCG/ACT nasal spray; Place 2 sprays into both nostrils daily. -     oxymetazoline (AFRIN NASAL SPRAY) 0.05 % nasal spray; Place 1 spray into both nostrils 2 (two) times daily. Use only for 3days, then stop -     dextromethorphan-guaiFENesin (MUCINEX DM) 30-600 MG 12hr tablet; Take 1 tablet by mouth 2 (two) times daily as needed for cough. -     HYDROcodone-homatropine (HYCODAN) 5-1.5 MG/5ML syrup;  Take 5 mLs by mouth at bedtime as needed for cough. -     azithromycin (ZITHROMAX Z-PAK) 250 MG tablet; Take 1 tablet (250 mg total) by mouth daily. Take 2tabs on first day, then 1tab once a day till complete   I am having Ms. Rewis start on fluticasone, oxymetazoline, dextromethorphan-guaiFENesin, HYDROcodone-homatropine, and azithromycin. I am also having her maintain her ipratropium, atenolol, losartan-hydrochlorothiazide, loratadine, and NONFORMULARY OR COMPOUNDED ITEM. We will continue to administer betamethasone acetate-betamethasone sodium phosphate, betamethasone acetate-betamethasone sodium phosphate, and betamethasone acetate-betamethasone sodium phosphate.  Meds ordered this encounter  Medications  . fluticasone (FLONASE) 50 MCG/ACT nasal spray    Sig: Place 2 sprays into both nostrils daily.    Dispense:  16 g    Refill:  0    Order Specific Question:   Supervising Provider    Answer:   Tresa GarterPLOTNIKOV, ALEKSEI V [[id number]]  . oxymetazoline (AFRIN NASAL SPRAY) 0.05 % nasal spray    Sig: Place 1 spray into both nostrils 2 (two) times daily. Use only for 3days, then stop    Dispense:  30 mL    Refill:  0    Order Specific Question:   Supervising Provider    Answer:   Tresa GarterPLOTNIKOV, ALEKSEI V [[id number]]  . dextromethorphan-guaiFENesin (MUCINEX DM) 30-600 MG 12hr tablet    Sig: Take 1 tablet by mouth 2 (two) times daily as needed for cough.    Dispense:  14 tablet    Refill:  0    Order Specific Question:   Supervising Provider    Answer:   Tresa GarterPLOTNIKOV, ALEKSEI V [[id number]]  . HYDROcodone-homatropine (HYCODAN) 5-1.5 MG/5ML syrup    Sig: Take 5 mLs by mouth at bedtime as needed for cough.    Dispense:  120 mL    Refill:  0    Order Specific Question:   Supervising Provider    Answer:   Tresa GarterPLOTNIKOV, ALEKSEI V [[id number]]  . azithromycin (ZITHROMAX Z-PAK) 250 MG tablet    Sig: Take 1 tablet (250 mg total) by mouth daily. Take 2tabs on first day, then 1tab once a day till complete    Dispense:  6 tablet     Refill:  0    Order Specific Question:   Supervising Provider    Answer:   Tresa GarterPLOTNIKOV, ALEKSEI V [[id number]]    Follow-up: Return if symptoms worsen or fail to improve.  Alysia Pennaharlotte Balraj Brayfield, NP

## 2016-09-17 ENCOUNTER — Encounter: Payer: Self-pay | Admitting: Nurse Practitioner

## 2016-09-22 ENCOUNTER — Ambulatory Visit (INDEPENDENT_AMBULATORY_CARE_PROVIDER_SITE_OTHER): Payer: Medicare Other | Admitting: Podiatry

## 2016-09-22 DIAGNOSIS — M79673 Pain in unspecified foot: Secondary | ICD-10-CM

## 2016-09-22 DIAGNOSIS — M79672 Pain in left foot: Secondary | ICD-10-CM

## 2016-09-22 DIAGNOSIS — M79671 Pain in right foot: Secondary | ICD-10-CM

## 2016-09-22 DIAGNOSIS — M722 Plantar fascial fibromatosis: Secondary | ICD-10-CM

## 2016-09-24 ENCOUNTER — Ambulatory Visit: Payer: Medicare Other | Admitting: Podiatry

## 2016-09-30 NOTE — Progress Notes (Signed)
   Subjective: Patient presents today for follow-up treatment and evaluation of bilateral plantar fasciitis. Patient states that the incision injections that she received last visit did help. She also states that the plantar fascial braces help.  Objective: Physical Exam General: The patient is alert and oriented x3 in no acute distress.  Dermatology: Skin is warm, dry and supple bilateral lower extremities. Negative for open lesions or macerations bilateral.   Vascular: Dorsalis Pedis and Posterior Tibial pulses palpable bilateral.  Capillary fill time is immediate to all digits.  Neurological: Epicritic and protective threshold intact bilateral.   Musculoskeletal: Tenderness to palpation at the medial calcaneal tubercale and through the insertion of the plantar fascia of the bilateral feet. All other joints range of motion within normal limits bilateral. Strength 5/5 in all groups bilateral.    Assessment: #1 plantar fasciitis bilateral feet #2 pain in bilateral feet  Plan of Care:  1. Patient evaluated. Xrays reviewed.   2.  Today the patient does not want additional antibiotic plantar injection. 3. Recommend wearing good supportive tennis shoes 4. Continue plantar fascial braces as needed 5. Return to clinic when necessary  Felecia ShellingBrent M. Willet Schleifer, DPM [facility]  Dr. Felecia ShellingBrent M. Pennie Vanblarcom, DPM    [address]                                        [place], [place] [address]                Office [phone]  Fax [phone]

## 2016-12-27 ENCOUNTER — Encounter: Payer: Self-pay | Admitting: Family Medicine

## 2016-12-27 ENCOUNTER — Ambulatory Visit (INDEPENDENT_AMBULATORY_CARE_PROVIDER_SITE_OTHER): Payer: Medicare Other | Admitting: Family Medicine

## 2016-12-27 ENCOUNTER — Telehealth: Payer: Self-pay | Admitting: Internal Medicine

## 2016-12-27 VITALS — BP 160/88 | HR 60 | Temp 98.0°F | Resp 16 | Ht 63.0 in | Wt 176.0 lb

## 2016-12-27 DIAGNOSIS — H1012 Acute atopic conjunctivitis, left eye: Secondary | ICD-10-CM

## 2016-12-27 DIAGNOSIS — I10 Essential (primary) hypertension: Secondary | ICD-10-CM | POA: Diagnosis not present

## 2016-12-27 MED ORDER — OLOPATADINE HCL 0.7 % OP SOLN: 1.0000 [drp] | Freq: Every day | OPHTHALMIC | 2 refills | Status: DC

## 2016-12-27 NOTE — Telephone Encounter (Signed)
PLEASE NOTE: All timestamps contained within this report are represented as [place] Standard Time. CONFIDENTIALTY NOTICE: This fax transmission is intended only for the addressee. It contains information that is legally privileged, confidential or otherwise protected from use or disclosure. If you are not the intended recipient, you are strictly prohibited from reviewing, disclosing, copying using or disseminating any of this information or taking any action in reliance on or regarding this information. If you have received this fax in error, please notify us immediately by telephone so that we can arrange for its return to us. Phone: [phone], Toll-Free: [phone], Fax: [phone] Page: 1 of 1 Call Id: [id number] [facility] Night - Client TELEPHONE ADVICE RECORD [facility] Patient Name: Megan Robbins DOB: 02/02/1936 Initial Comment Caller states eye feels like it's got grit in it, rubbing makes it burns, went to get eye drops last night, and now moved to the corner of the eye. Nurse Assessment Nurse: Debera Latalston, RN, Tinnie GensJeffrey Date/Time Lamount Cohen(Eastern Time): 12/27/2016 9:00:27 AM Confirm and document reason for call. If symptomatic, describe symptoms. ---Caller states eye feels like it's got grit in it, rubbing makes it burns, went to get eye drops last night, and now moved to the corner of the eye. Having symptoms for a week. No eye pain. Does the patient have any new or worsening symptoms? ---Yes Will a triage be completed? ---Yes Related visit to physician within the last 2 weeks? ---No Does the PT have any chronic conditions? (i.e. diabetes, asthma, etc.) ---Yes List chronic conditions. ---HTN Is this a behavioral health or substance abuse call? ---No Guidelines Guideline Title Affirmed Question Affirmed Notes Eye - Foreign Body Minor foreign body in the eye Final Disposition User Home Care Bonadelle RanchosRalston, RN, Tinnie GensJeffrey Comments Advised caller to home care  but she really wanted to be seen today in the clinic because she was concerned about eye symptoms. Appointment was scheduled. Disagree/Comply: Comply

## 2016-12-27 NOTE — Progress Notes (Signed)
Pre visit review using our clinic review tool, if applicable. No additional management support is needed unless otherwise documented below in the visit note. 

## 2016-12-27 NOTE — Patient Instructions (Signed)
  [name] I have seen you today for an acute visit.  A few things to remember from today's visit:   Allergic conjunctivitis of left eye - Plan: Olopatadine HCl (PAZEO) 0.7 % SOLN  Essential hypertension, benign      Monitor blood pressure. Blood pressure goal for most people is less than 140/90. Some populations (older than 60) the goal is less than 150/90.  Most recent cardiologists' recommendations recommend blood pressure at or less than 130/80.   Elevated blood pressure increases the risk of strokes, heart and kidney disease, and eye problems. Regular physical activity and a healthy diet (DASH diet) usually help. Low salt diet. Take medications as instructed.  Caution with some over the counter medications as cold medications, dietary products (for weight loss), and Ibuprofen or Aleve (frequent use);all these medications could cause elevation of blood pressure.     Medications prescribed today are intended for short period of time and will not be refill upon request, a follow up appointment might be necessary to discuss continuation of of treatment if appropriate.     In general please monitor for signs of worsening symptoms and seek immediate medical attention if any concerning.  If symptoms are not resolved in 2 weeks you should schedule a follow up appointment with your doctor, before if needed.

## 2016-12-27 NOTE — Progress Notes (Signed)
HPI:   ACUTE VISIT:  Chief Complaint  Patient presents with  . eye irritation    Megan Robbins is a 81 y.o. female, who is here today complaining of 6 days of left eye pruritus,intermittent burning sensation, and foreign body sensation.  Symptoms move from nasal to temporal eye angle. Denies eye discharge or pain.  She denies any eye trauma or activity that may have cause foreign body in eye.  Conjunctivitis   The current episode started 5 to 7 days ago. The onset was gradual. The problem occurs frequently. The problem has been unchanged. The problem is mild. Nothing relieves the symptoms. Nothing aggravates the symptoms. Associated symptoms include eye itching. Pertinent negatives include no fever, no decreased vision, no double vision, no photophobia, no nausea, no vomiting, no congestion, no headaches, no mouth sores, no sore throat, no swollen glands, no muscle aches, no neck pain, no cough, no URI, no wheezing, no rash, no eye discharge, no eye pain and no eye redness. The left eye is affected. The eye pain is not associated with movement. The eyelid exhibits no abnormality.   She has not identified exacerbating or alleviating factors.  No changes in vision. She has used OTC natural tears.  HTN: Her BP is elevated today. She states that she checks BP at home and it is "good." Just took her medication, Atenolol, and states that she ate salty ham this morning.  Denies headache, chest pain, dyspnea, palpitation, claudication, focal weakness, or edema.  Review of Systems  Constitutional: Negative for chills, fatigue and fever.  HENT: Negative for congestion, facial swelling, mouth sores, sore throat, trouble swallowing and voice change.   Eyes: Positive for itching. Negative for double vision, photophobia, pain, discharge, redness and visual disturbance.  Respiratory: Negative for cough, shortness of breath and wheezing.   Cardiovascular: Negative for chest pain,  palpitations and leg swelling.  Gastrointestinal: Negative for nausea and vomiting.  Musculoskeletal: Negative for myalgias and neck pain.  Skin: Negative for pallor and rash.  Allergic/Immunologic: Positive for environmental allergies.  Neurological: Negative for weakness and headaches.  Psychiatric/Behavioral: Negative for confusion. The patient is nervous/anxious.     Current Outpatient Prescriptions on File Prior to Visit  Medication Sig Dispense Refill  . atenolol (TENORMIN) 50 MG tablet Take 1 tablet (50 mg total) by mouth daily. 90 tablet 1  . azithromycin (ZITHROMAX Z-PAK) 250 MG tablet Take 1 tablet (250 mg total) by mouth daily. Take 2tabs on first day, then 1tab once a day till complete 6 tablet 0  . dextromethorphan-guaiFENesin (MUCINEX DM) 30-600 MG 12hr tablet Take 1 tablet by mouth 2 (two) times daily as needed for cough. 14 tablet 0  . fluticasone (FLONASE) 50 MCG/ACT nasal spray Place 2 sprays into both nostrils daily. 16 g 0  . HYDROcodone-homatropine (HYCODAN) 5-1.5 MG/5ML syrup Take 5 mLs by mouth at bedtime as needed for cough. 120 mL 0  . ipratropium (ATROVENT) 0.03 % nasal spray USE 2 SPRAYS IN NOSTRILS 4 TIMES DAILY  0  . loratadine (CLARITIN) 10 MG tablet Take 10 mg by mouth daily.    Marland Kitchen. losartan-hydrochlorothiazide (HYZAAR) 100-25 MG tablet Take 1 tablet by mouth daily. 90 tablet 1  . NONFORMULARY OR COMPOUNDED ITEM Apply 1-2 g topically 4 (four) times daily. 120 each 2  . oxymetazoline (AFRIN NASAL SPRAY) 0.05 % nasal spray Place 1 spray into both nostrils 2 (two) times daily. Use only for 3days, then stop 30 mL 0   Current  Facility-Administered Medications on File Prior to Visit  Medication Dose Route Frequency Provider Last Rate Last Dose  . betamethasone acetate-betamethasone sodium phosphate (CELESTONE) injection 12 mg  12 mg Intramuscular Once Gala Lewandowsky M, DPM      . betamethasone acetate-betamethasone sodium phosphate (CELESTONE) injection 3 mg  3 mg  Intramuscular Once Gala Lewandowsky M, DPM      . betamethasone acetate-betamethasone sodium phosphate (CELESTONE) injection 3 mg  3 mg Intramuscular Once Felecia Shelling, DPM         Past Medical History:  Diagnosis Date  . Anxiety   . Hypertension    No Known Allergies  Social History   Social History  . Marital status: Married    Spouse name: N/A  . Number of children: N/A  . Years of education: N/A   Social History Main Topics  . Smoking status: Never Smoker  . Smokeless tobacco: Never Used  . Alcohol use No  . Drug use: No  . Sexual activity: Yes    Birth control/ protection: Post-menopausal   Other Topics Concern  . Not on file   Social History Narrative   No regular exercise    Vitals:   12/27/16 0953  BP: (!) 160/88  Pulse: 60  Resp: 16  Temp: 98 F (36.7 C)  O2 sat at RA 97% Body mass index is 31.18 kg/m.   Physical Exam  Nursing note and vitals reviewed. Constitutional: She is oriented to person, place, and time. She appears well-developed. She does not appear ill. No distress.  HENT:  Head: Atraumatic.  Mouth/Throat: Oropharynx is clear and moist and mucous membranes are normal.  Eyes: Conjunctivae, EOM and lids are normal. Pupils are equal, round, and reactive to light. Lids are everted and swept, no foreign bodies found.  Left eye: No conjunctival erythema, no photophobia. Conjunctivae otherwise normal except for lower tarsal conjunctivae of temporal angle with a few small cobblestones.  Cardiovascular: Normal rate and regular rhythm.   Murmur (soft SEM RUSB and LUSB) heard. Respiratory: Effort normal and breath sounds normal. No respiratory distress.  Musculoskeletal: She exhibits no edema.  Lymphadenopathy:       Head (right side): No preauricular adenopathy present.       Head (left side): No preauricular adenopathy present.    She has no cervical adenopathy.  Neurological: She is alert and oriented to person, place, and time. She has normal  strength. No cranial nerve deficit. Coordination and gait normal.  Skin: Skin is warm. No rash noted. No erythema.  Psychiatric: Her speech is normal. Her mood appears anxious.  Well groomed, good eye contact.    ASSESSMENT AND PLAN:   Jizel was seen today for eye irritation.  Diagnoses and all orders for this visit:  Allergic conjunctivitis of left eye  Hx and examination do not suggest a serious eye process. Explained pt limitation in regard to tools availability for a more thorough eye exam, so if symptoms persists she needs to follow with her eye care provider. Instructed about warning signs.  -     Olopatadine HCl (PAZEO) 0.7 % SOLN; Apply 1 drop to eye daily.  Essential hypertension, benign  She did not want to have BP re-checked here in office. Monitor BP at home. No changes in current management. Low salt diet recommended. Instructed about warning signs. F/U with PCP.    -Ms.Clovis L Brickner advised to seek immediate medical attention is symptoms suddenly get worse or to follow if symptoms persist or new  concerns arise.       Betty G. Martinique, MD  [facility]. [place] office.

## 2017-03-16 ENCOUNTER — Ambulatory Visit (INDEPENDENT_AMBULATORY_CARE_PROVIDER_SITE_OTHER): Payer: Medicare Other | Admitting: Podiatry

## 2017-03-16 ENCOUNTER — Encounter: Payer: Self-pay | Admitting: Podiatry

## 2017-03-16 DIAGNOSIS — M722 Plantar fascial fibromatosis: Secondary | ICD-10-CM

## 2017-03-16 MED ORDER — BETAMETHASONE SOD PHOS & ACET 6 (3-3) MG/ML IJ SUSP: 3.0000 mg | Freq: Once | INTRAMUSCULAR | Status: DC

## 2017-03-16 NOTE — Progress Notes (Signed)
   Subjective: 81 year old female presents today for follow-up treatment and evaluation regarding chronic plantar fasciitis to the bilateral feet. Patient was last seen 09/22/2016. Patient has had ongoing chronic plantar fasciitis for greater than 1 year. Conservative modalities have failed in providing any sort of lasting alleviation of symptoms. Patient presents today for follow-up treatment and evaluation with recurrent heel pain  Objective: Physical Exam General: The patient is alert and oriented x3 in no acute distress.  Dermatology: Skin is warm, dry and supple bilateral lower extremities. Negative for open lesions or macerations bilateral.   Vascular: Dorsalis Pedis and Posterior Tibial pulses palpable bilateral.  Capillary fill time is immediate to all digits.  Neurological: Epicritic and protective threshold intact bilateral.   Musculoskeletal: Tenderness to palpation at the medial calcaneal tubercale and through the insertion of the plantar fascia of the bilateral feet. All other joints range of motion within normal limits bilateral. Strength 5/5 in all groups bilateral.   Radiographic exam: Normal osseous mineralization. Joint spaces preserved. No fracture/dislocation/boney destruction. Calcaneal spur present with mild thickening of plantar fascia bilateral. No other soft tissue abnormalities or radiopaque foreign bodies.   Assessment: 1. plantar fasciitis bilateral feet  Plan of Care:  1. Patient evaluated.  2. Injection of 0.5 mL Celestone Soluspan injected in the patient's bilateral heels 3. Today we discussed continued conservative versus surgical management of chronic plantar fasciitis. The patient opts for surgical management. All possible complications and details of procedure were explained. No guarantees were expressed or implied. 4. Authorization for surgery was initiated today. Surgery will consist of endoscopic plantar fasciotomy bilateral 5. Postoperative shoes were  dispensed today 2 6. Return to clinic 1 week postop   Felecia ShellingBrent M. Felisha Claytor, DPM [facility]  Dr. Felecia ShellingBrent M. Addilee Neu, DPM    [address]                                   [place], [place] [address]                Office [phone]  Fax [phone]([phone]

## 2017-03-16 NOTE — Patient Instructions (Signed)
Pre-Operative Instructions  Congratulations, you have decided to take an important step towards improving your quality of life.  You can be assured that the doctors and staff at [facility] will be with you every step of the way.  Here are some important things you should know:  1. Plan to be at the surgery center/hospital at least 1 (one) hour prior to your scheduled time, unless otherwise directed by the surgical center/hospital staff.  You must have a responsible adult accompany you, remain during the surgery and drive you home.  Make sure you have directions to the surgical center/hospital to ensure you arrive on time. 2. If you are having surgery at [facility] or [facility], you will need a copy of your medical history and physical form from your family physician within one month prior to the date of surgery. We will give you a form for your primary physician to complete.  3. We make every effort to accommodate the date you request for surgery.  However, there are times where surgery dates or times have to be moved.  We will contact you as soon as possible if a change in schedule is required.   4. No aspirin/ibuprofen for one week before surgery.  If you are on aspirin, any non-steroidal anti-inflammatory medications (Mobic, Aleve, Ibuprofen) should not be taken seven (7) days prior to your surgery.  You make take Tylenol for pain prior to surgery.  5. Medications - If you are taking daily heart and blood pressure medications, seizure, reflux, allergy, asthma, anxiety, pain or diabetes medications, make sure you notify the surgery center/hospital before the day of surgery so they can tell you which medications you should take or avoid the day of surgery. 6. No food or drink after midnight the night before surgery unless directed otherwise by surgical center/hospital staff. 7. No alcoholic beverages 24-hours prior to surgery.  No smoking 24-hours prior or 24-hours after  surgery. 8. Wear loose pants or shorts. They should be loose enough to fit over bandages, boots, and casts. 9. Don't wear slip-on shoes. Sneakers are preferred. 10. Bring your boot with you to the surgery center/hospital.  Also bring crutches or a walker if your physician has prescribed it for you.  If you do not have this equipment, it will be provided for you after surgery. 11. If you have not been contacted by the surgery center/hospital by the day before your surgery, call to confirm the date and time of your surgery. 12. Leave-time from work may vary depending on the type of surgery you have.  Appropriate arrangements should be made prior to surgery with your employer. 13. Prescriptions will be provided immediately following surgery by your doctor.  Fill these as soon as possible after surgery and take the medication as directed. Pain medications will not be refilled on weekends and must be approved by the doctor. 14. Remove nail polish on the operative foot and avoid getting pedicures prior to surgery. 15. Wash the night before surgery.  The night before surgery wash the foot and leg well with water and the antibacterial soap provided. Be sure to pay special attention to beneath the toenails and in between the toes.  Wash for at least three (3) minutes. Rinse thoroughly with water and dry well with a towel.  Perform this wash unless told not to do so by your physician.  Enclosed: 1 Ice pack (please put in freezer the night before surgery)   1 Hibiclens skin cleaner     Pre-op instructions  If you have any questions regarding the instructions, please do not hesitate to call our office.  [place]: [address], [place], [place] [address] -- [phone]  [place]: [address] [place], [place] [address] -- [phone]  [place]: [address]  [place], [place] [address] -- [phone]  [place]: [address], [address], [place],  [address] -- [phone]  Website:  [email or web address] 

## 2017-04-10 ENCOUNTER — Telehealth: Payer: Self-pay | Admitting: *Deleted
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
## 2017-04-13 ENCOUNTER — Telehealth: Payer: Self-pay | Admitting: *Deleted

## 2017-04-13 NOTE — Telephone Encounter (Signed)
ok 
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
## 2017-04-22 ENCOUNTER — Encounter: Payer: Medicare Other | Admitting: Podiatry

## 2017-04-26 ENCOUNTER — Ambulatory Visit (HOSPITAL_COMMUNITY)
Admission: EM | Admit: 2017-04-26 | Discharge: 2017-04-26 | Disposition: A | Discharge status: Home / Self Care | Payer: Medicare Other | Attending: Internal Medicine | Admitting: Internal Medicine

## 2017-04-26 ENCOUNTER — Encounter (HOSPITAL_COMMUNITY): Payer: Self-pay | Admitting: *Deleted

## 2017-04-26 DIAGNOSIS — S39012A Strain of muscle, fascia and tendon of lower back, initial encounter: Secondary | ICD-10-CM

## 2017-04-26 MED ORDER — KETOROLAC TROMETHAMINE 60 MG/2ML IM SOLN
30.0000 mg | Freq: Once | INTRAMUSCULAR | Status: AC
  Administered 2017-04-26: 30 mg via INTRAMUSCULAR

## 2017-04-26 MED ORDER — KETOROLAC TROMETHAMINE 30 MG/ML IJ SOLN
INTRAMUSCULAR | Status: AC
  Filled 2017-04-26: qty 1

## 2017-04-26 MED ORDER — NAPROXEN 375 MG PO TABS: 375.0000 mg | ORAL_TABLET | Freq: Two times a day (BID) | ORAL | 0 refills | Status: DC

## 2017-04-26 NOTE — Discharge Instructions (Addendum)
Anticipate gradual improvement in pain in the low back over the next couple of weeks. No danger signs on exam today.  Stay active while avoiding activities that cause a severe increase in back pain. Prescription for naproxen, an anti-inflammatory/pain reliever, was sent to the pharmacy. Try ice for 5-10 minutes several times daily to provide additional pain relief. Physical therapy or chiropractic sometimes produces additional pain relief.  Followup with your primary care provider if pain is not improving as expected.

## 2017-04-26 NOTE — ED Provider Notes (Signed)
[facility]    CSN: [id number] Arrival date & time: 04/26/17  1202     History   Chief Complaint Chief Complaint  Patient presents with  . Back Pain    HPI Megan Robbins is a 81 y.o. female. She presents today with 3 day history of pain in her low back, started when she was helping her sister who has dementia up off the ground. She was lifting her sister and then turned to the left and that's when the pain started. No weakness or clumsiness in her legs, no change in bowel or bladder function. No loss of sensation. Pain had been radiating into the bilateral legs, now is only felt in the right leg, occasionally. The patient is worried about cancer; she has not taken anything at home for pain. A heating pad did not really help. She is tearful and seems overwhelmed. She is taking care of her sister and her husband.    HPI  Past Medical History:  Diagnosis Date  . Anxiety   . Hypertension     Patient Active Problem List   Diagnosis Date Noted  . Overweight 05/23/2015  . Essential hypertension, benign 05/08/2015    Past Surgical History:  Procedure Laterality Date  . ABDOMINAL HYSTERECTOMY    . APPENDECTOMY    . FOOT SURGERY    . HEEL SPUR SURGERY    . TONSILLECTOMY       Home Medications    Prior to Admission medications   Medication Sig Start Date End Date Taking? Authorizing Provider  atenolol (TENORMIN) 50 MG tablet Take 1 tablet (50 mg total) by mouth daily. 04/01/16  Yes Burns, Bobette Mo, MD  losartan-hydrochlorothiazide (HYZAAR) 100-25 MG tablet Take 1 tablet by mouth daily. 04/01/16  Yes Burns, Bobette Mo, MD  ipratropium (ATROVENT) 0.03 % nasal spray USE 2 SPRAYS IN NOSTRILS 4 TIMES DAILY 04/11/15   [provider]  loratadine (CLARITIN) 10 MG tablet Take 10 mg by mouth daily.    [provider]  naproxen (NAPROSYN) 375 MG tablet Take 1 tablet (375 mg total) by mouth 2 (two) times daily. 04/26/17   Eustace Moore, MD  NONFORMULARY OR  COMPOUNDED ITEM Apply 1-2 g topically 4 (four) times daily. 04/28/16   Felecia Shelling, DPM  Olopatadine HCl (PAZEO) 0.7 % SOLN Apply 1 drop to eye daily. 12/27/16   [place], Betty G, MD    Family History Family History  Problem Relation Age of Onset  . Hyperlipidemia Mother   . Hypertension Mother   . Diabetes Daughter   . Diabetes Son     Social History Social History  Substance Use Topics  . Smoking status: Never Smoker  . Smokeless tobacco: Never Used  . Alcohol use No     Allergies   Patient has no known allergies.   Review of Systems Review of Systems  All other systems reviewed and are negative.    Physical Exam Triage Vital Signs ED Triage Vitals [04/26/17 1228]  Enc Vitals Group     BP (!) 185/89     Pulse Rate 99     Resp 18     Temp 97.9 F (36.6 C)     Temp Source Oral     SpO2 99 %     Weight      Height      Pain Score 0     Pain Loc    Updated Vital Signs BP (!) 185/89   Pulse 99  Temp 97.9 F (36.6 C) (Oral)   Resp 18   SpO2 99%   Physical Exam  Constitutional: She is oriented to person, place, and time. No distress.  HENT:  Head: Atraumatic.  Eyes:  Conjugate gaze observed, no eye redness/discharge  Neck: Neck supple.  Cardiovascular: Normal rate.   Pulmonary/Chest: No respiratory distress.  Abdominal: She exhibits no distension.  Musculoskeletal: Normal range of motion.  No focal tenderness to palpation in the low back. Pain is most pronounced in the sacrum and parasacral areas. Rash, no bruise. Able to walk into the urgent care independently, and climb on/off the exam table without difficulty.  Neurological: She is alert and oriented to person, place, and time.  Skin: Skin is warm and dry.  Nursing note and vitals reviewed.    UC Treatments / Results   Procedures Procedures (including critical care time)  Medications Ordered in UC Medications  ketorolac (TORADOL) injection 30 mg (30 mg Intramuscular Given 04/26/17 1307)       Final Clinical Impressions(s) / UC Diagnoses   Final diagnoses:  Acute myofascial strain of lumbar region, initial encounter   Anticipate gradual improvement in pain in the low back over the next couple of weeks. No danger signs on exam today.  Stay active while avoiding activities that cause a severe increase in back pain. Prescription for naproxen, an anti-inflammatory/pain reliever, was sent to the pharmacy. Try ice for 5-10 minutes several times daily to provide additional pain relief. Physical therapy or chiropractic sometimes produces additional pain relief.  Followup with your primary care provider if pain is not improving as expected.    New Prescriptions Discharge Medication List as of 04/26/2017  1:02 PM    START taking these medications   Details  naproxen (NAPROSYN) 375 MG tablet Take 1 tablet (375 mg total) by mouth 2 (two) times daily., Starting Sun 04/26/2017, Normal         Controlled Substance Prescriptions [place] Controlled Substance Registry consulted? No   Eustace Moore, MD 04/27/17 [phone]

## 2017-04-26 NOTE — ED Triage Notes (Signed)
Reports taking care of sister; was lifting her yesterday when she felt a pop in lower back.  C/O pain across lower back and into hips, occasionally radiating down into right upper leg.  Denies any parasthesias.  Pt tearful, under a significant deal of stress.

## 2017-04-29 ENCOUNTER — Encounter: Payer: Medicare Other | Admitting: Podiatry

## 2017-05-02 ENCOUNTER — Other Ambulatory Visit: Payer: Self-pay | Admitting: Internal Medicine

## 2017-05-08 ENCOUNTER — Encounter: Payer: Self-pay | Admitting: Internal Medicine

## 2017-05-08 ENCOUNTER — Other Ambulatory Visit (INDEPENDENT_AMBULATORY_CARE_PROVIDER_SITE_OTHER): Payer: Medicare Other

## 2017-05-08 ENCOUNTER — Ambulatory Visit (INDEPENDENT_AMBULATORY_CARE_PROVIDER_SITE_OTHER): Payer: Medicare Other | Admitting: Internal Medicine

## 2017-05-08 ENCOUNTER — Ambulatory Visit (INDEPENDENT_AMBULATORY_CARE_PROVIDER_SITE_OTHER)
Admission: RE | Admit: 2017-05-08 | Discharge: 2017-05-08 | Disposition: A | Discharge status: Home / Self Care | Payer: Medicare Other | Source: Ambulatory Visit | Attending: Internal Medicine | Admitting: Internal Medicine

## 2017-05-08 DIAGNOSIS — M5441 Lumbago with sciatica, right side: Secondary | ICD-10-CM

## 2017-05-08 DIAGNOSIS — F4329 Adjustment disorder with other symptoms: Secondary | ICD-10-CM | POA: Diagnosis not present

## 2017-05-08 DIAGNOSIS — I10 Essential (primary) hypertension: Secondary | ICD-10-CM | POA: Diagnosis not present

## 2017-05-08 DIAGNOSIS — M5442 Lumbago with sciatica, left side: Secondary | ICD-10-CM

## 2017-05-08 DIAGNOSIS — M549 Dorsalgia, unspecified: Secondary | ICD-10-CM | POA: Insufficient documentation

## 2017-05-08 HISTORY — DX: Adjustment disorder with other symptoms: F43.29

## 2017-05-08 HISTORY — DX: Dorsalgia, unspecified: M54.9

## 2017-05-08 LAB — COMPREHENSIVE METABOLIC PANEL
ALT: 10 U/L (ref 0–35)
AST: 10 U/L (ref 0–37)
Albumin: 4.2 g/dL (ref 3.5–5.2)
Alkaline Phosphatase: 46 U/L (ref 39–117)
BILIRUBIN TOTAL: 0.4 mg/dL (ref 0.2–1.2)
BUN: 33 mg/dL — ABNORMAL HIGH (ref 6–23)
CALCIUM: 9.4 mg/dL (ref 8.4–10.5)
CO2: 26 meq/L (ref 19–32)
Chloride: 102 mEq/L (ref 96–112)
Creatinine, Ser: 1.7 mg/dL — ABNORMAL HIGH (ref 0.40–1.20)
GFR: 37.28 mL/min — AB (ref >60.00)
GLUCOSE: 100 mg/dL — AB (ref 70–99)
Potassium: 3.7 mEq/L (ref 3.5–5.1)
Sodium: 137 mEq/L (ref 135–145)
Total Protein: 7.7 g/dL (ref 6.0–8.3)

## 2017-05-08 MED ORDER — AMLODIPINE BESYLATE 2.5 MG PO TABS: 2.5000 mg | ORAL_TABLET | Freq: Every day | ORAL | 3 refills | Status: DC

## 2017-05-08 MED ORDER — TRAMADOL HCL 50 MG PO TABS: 50.0000 mg | ORAL_TABLET | Freq: Three times a day (TID) | ORAL | 0 refills | Status: DC | PRN
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
## 2017-05-08 NOTE — Assessment & Plan Note (Addendum)
BP at home 140's/70 Has been very elevated at urgent care and here-may be whitecoat related more possibly related to increased stress Will start amlodipine 2.5 mg daily Continue other medications CMP today-she has not had blood work done in a while [employer] have her follow up when she sees Dr. Katrinka Blazing to recheck her blood pressure

## 2017-05-08 NOTE — Patient Instructions (Addendum)
Have an xray today and blood work today.  Start amlodipine 2.5 mg daily for your blood pressure.  Continue your other medications.   Monitor your BP at home  Follow up with me the same day you see sports medicine for your back so we can recheck your BP.

## 2017-05-08 NOTE — Progress Notes (Signed)
Subjective:    Patient ID: Megan Robbins, female    DOB: 08-13-1935, 81 y.o.   MRN: [id number]  HPI She is here for an acute visit.   Back pain: She went to urgent care 9/23 for back pain. She stated at bedtime it started 3 days prior after trying to help her sister off the ground who has dementia. She heard a pop when the pain occured.  The pain was located in her lower back. The pain initially was radiating into both legs, but when she went to the emergency room was only in her right leg and it was intermittent. She was not experiencing weakness, numbness/tingling or change in bowel/bladder function. She did not take anything for pain. She was worried about the possibility of cancer. She admitted to feeling overwhelmed taking care of her husband and sister. She received Toradol 30 mg. She was prescribed Naprosyn 375 mg twice daily. She was advised to ice for 5-10 minutes several times a day. She was advised to follow-up here. Her blood pressure was elevated in the emergency room.    The naproxen helped some, but not enough.  She is still having pain.  The pain is in the central lower back and is intermittent.  The pain radiates around to the hips and down the anterior legs.  The pain tends to come when she first sits and then gets better, but is still there.  She has been icing and it helps temporarily.  She denies muscle spasms.  She denies numbness/tingling.    Elevated blood pressure/hypertension: She is taking her medication daily as prescribed. She has not been seen here recently. She states she monitors her blood pressure at home and it is typically 140/70's and she feels it is well controlled. She is compliant with a low sodium diet. She denies headaches, lightheadedness, shortness of breath, chest pain, palpitations and leg swelling.  Feeling overwhelmed: She is taking care of her sister and her husband. She has no other support. She does feel alone at times. She states she is not  interested in medication or talking to anyone.  Medications and allergies reviewed with patient and updated if appropriate.  Patient Active Problem List   Diagnosis Date Noted  . Overweight 05/23/2015  . Essential hypertension, benign 05/08/2015    Current Outpatient Prescriptions on File Prior to Visit  Medication Sig Dispense Refill  . atenolol (TENORMIN) 50 MG tablet Take 1 tablet (50 mg total) by mouth daily. 90 tablet 1  . ipratropium (ATROVENT) 0.03 % nasal spray USE 2 SPRAYS IN NOSTRILS 4 TIMES DAILY  0  . loratadine (CLARITIN) 10 MG tablet Take 10 mg by mouth daily.    Marland Kitchen losartan-hydrochlorothiazide (HYZAAR) 100-25 MG tablet Take 1 tablet by mouth daily. -- Office visit needed for further refills 30 tablet 0  . NONFORMULARY OR COMPOUNDED ITEM Apply 1-2 g topically 4 (four) times daily. 120 each 2  . Olopatadine HCl (PAZEO) 0.7 % SOLN Apply 1 drop to eye daily. 1 Bottle 2  . naproxen (NAPROSYN) 375 MG tablet Take 1 tablet (375 mg total) by mouth 2 (two) times daily. (Patient not taking: Reported on 05/08/2017) 20 tablet 0   No current facility-administered medications on file prior to visit.     Past Medical History:  Diagnosis Date  . Anxiety   . Hypertension     Past Surgical History:  Procedure Laterality Date  . ABDOMINAL HYSTERECTOMY    . APPENDECTOMY    .  FOOT SURGERY    . HEEL SPUR SURGERY    . TONSILLECTOMY      Social History   Social History  . Marital status: Married    Spouse name: N/A  . Number of children: N/A  . Years of education: N/A   Social History Main Topics  . Smoking status: Never Smoker  . Smokeless tobacco: Never Used  . Alcohol use No  . Drug use: No  . Sexual activity: Yes    Birth control/ protection: Post-menopausal   Other Topics Concern  . Not on file   Social History Narrative   No regular exercise    Family History  Problem Relation Age of Onset  . Hyperlipidemia Mother   . Hypertension Mother   . Diabetes  Daughter   . Diabetes Son     Review of Systems  Constitutional: Negative for chills and fever.  Respiratory: Negative for shortness of breath.   Cardiovascular: Negative for chest pain, palpitations and leg swelling.  Genitourinary:       No change  Musculoskeletal: Positive for back pain.  Neurological: Negative for weakness, light-headedness, numbness and headaches.       Objective:   Vitals:   05/08/17 1605 05/08/17 1625  BP: (!) 184/108 (!) 172/98  Pulse: 65   Resp: 16   Temp: 98.2 F (36.8 C)   SpO2: 97%    Filed Weights   05/08/17 1605  Weight: 175 lb (79.4 kg)   Body mass index is 31 kg/m.  Wt Readings from Last 3 Encounters:  05/08/17 175 lb (79.4 kg)  12/27/16 176 lb (79.8 kg)  09/16/16 175 lb (79.4 kg)     Physical Exam Constitutional: Appears well-developed and well-nourished. No distress.  HENT:  Head: Normocephalic and atraumatic.  Neck: Neck supple. No tracheal deviation present. No thyromegaly present.  No cervical lymphadenopathy Cardiovascular: Normal rate, regular rhythm and normal heart sounds.   2/6 sys murmur heard.   No edema Pulmonary/Chest: Effort normal and breath sounds normal. No respiratory distress. No has no wheezes. No rales.  Msk: Tenderness with palpation central lower back-lumbar region, no SI joint tenderness, increased pain with flexion, no pain with extension Neurological: Normal strength and sensation bilateral lower extremities, gait normal  Skin: Skin is warm and dry. Not diaphoretic.  Psychiatric: anxious mood and affect. Behavior is normal.         Assessment & Plan:   See Problem List for Assessment and Plan of chronic medical problems.

## 2017-05-08 NOTE — Assessment & Plan Note (Signed)
Started approximately one month ago after trying to help her sister up off the ground. She heard a pop when the pain started. She did go to urgent care. No imaging was done. Naproxen has helped, but not enough Pain is now intermittent in central lower back radiates around to bilateral hips and down bilateral legs anteriorly. Pain mostly with first sitting down No numbness/tingling or weakness, no change in bowel/bladder ? Needs adjustment Lumbar x-ray today We'll refer to Dr. Katrinka Blazing for further evaluation/treatment Requesting something for pain-we'll prescribe tramadol

## 2017-05-13 ENCOUNTER — Ambulatory Visit: Payer: Self-pay | Admitting: Internal Medicine

## 2017-05-13 ENCOUNTER — Ambulatory Visit (INDEPENDENT_AMBULATORY_CARE_PROVIDER_SITE_OTHER): Payer: Medicare Other | Admitting: Family Medicine

## 2017-05-13 ENCOUNTER — Encounter: Payer: Self-pay | Admitting: Family Medicine

## 2017-05-13 DIAGNOSIS — N289 Disorder of kidney and ureter, unspecified: Secondary | ICD-10-CM | POA: Insufficient documentation

## 2017-05-13 DIAGNOSIS — S32030A Wedge compression fracture of third lumbar vertebra, initial encounter for closed fracture: Secondary | ICD-10-CM | POA: Insufficient documentation

## 2017-05-13 HISTORY — DX: Disorder of kidney and ureter, unspecified: N28.9

## 2017-05-13 HISTORY — DX: Wedge compression fracture of third lumbar vertebra, initial encounter for closed fracture: S32.030A

## 2017-05-13 MED ORDER — VITAMIN D (ERGOCALCIFEROL) 1.25 MG (50000 UNIT) PO CAPS: 50000.0000 [IU] | ORAL_CAPSULE | ORAL | 0 refills | Status: DC

## 2017-05-13 NOTE — Progress Notes (Deleted)
Subjective:    Patient ID: Megan Robbins, female    DOB: 29-Jul-1936, 81 y.o.   MRN: [id number]  HPI The patient is here for follow up.  Hypertension: She is taking her medication daily. She is compliant with a low sodium diet.  She denies chest pain, palpitations, edema, shortness of breath and regular headaches. She is exercising regularly.  She does not monitor her blood pressure at home.    Decreased kidney function:    Medications and allergies reviewed with patient and updated if appropriate.  Patient Active Problem List   Diagnosis Date Noted  . Lower back pain 05/08/2017  . Stress and adjustment reaction 05/08/2017  . Overweight 05/23/2015  . Essential hypertension, benign 05/08/2015    Current Outpatient Prescriptions on File Prior to Visit  Medication Sig Dispense Refill  . amLODipine (NORVASC) 2.5 MG tablet Take 1 tablet (2.5 mg total) by mouth daily. 90 tablet 3  . atenolol (TENORMIN) 50 MG tablet Take 1 tablet (50 mg total) by mouth daily. 90 tablet 1  . ipratropium (ATROVENT) 0.03 % nasal spray USE 2 SPRAYS IN NOSTRILS 4 TIMES DAILY  0  . loratadine (CLARITIN) 10 MG tablet Take 10 mg by mouth daily.    Marland Kitchen losartan-hydrochlorothiazide (HYZAAR) 100-25 MG tablet Take 1 tablet by mouth daily. -- Office visit needed for further refills 30 tablet 0  . naproxen (NAPROSYN) 375 MG tablet Take 1 tablet (375 mg total) by mouth 2 (two) times daily. (Patient not taking: Reported on 05/08/2017) 20 tablet 0  . NONFORMULARY OR COMPOUNDED ITEM Apply 1-2 g topically 4 (four) times daily. 120 each 2  . Olopatadine HCl (PAZEO) 0.7 % SOLN Apply 1 drop to eye daily. 1 Bottle 2  . traMADol (ULTRAM) 50 MG tablet Take 1 tablet (50 mg total) by mouth every 8 (eight) hours as needed. 30 tablet 0   No current facility-administered medications on file prior to visit.     Past Medical History:  Diagnosis Date  . Anxiety   . Hypertension     Past Surgical History:  Procedure  Laterality Date  . ABDOMINAL HYSTERECTOMY    . APPENDECTOMY    . FOOT SURGERY    . HEEL SPUR SURGERY    . TONSILLECTOMY      Social History   Social History  . Marital status: Married    Spouse name: N/A  . Number of children: N/A  . Years of education: N/A   Social History Main Topics  . Smoking status: Never Smoker  . Smokeless tobacco: Never Used  . Alcohol use No  . Drug use: No  . Sexual activity: Yes    Birth control/ protection: Post-menopausal   Other Topics Concern  . Not on file   Social History Narrative   No regular exercise    Family History  Problem Relation Age of Onset  . Hyperlipidemia Mother   . Hypertension Mother   . Diabetes Daughter   . Diabetes Son     Review of Systems     Objective:  There were no vitals filed for this visit. Wt Readings from Last 3 Encounters:  05/08/17 175 lb (79.4 kg)  12/27/16 176 lb (79.8 kg)  09/16/16 175 lb (79.4 kg)   There is no height or weight on file to calculate BMI.   Physical Exam    Constitutional: Appears well-developed and well-nourished. No distress.  HENT:  Head: Normocephalic and atraumatic.  Neck: Neck supple. No tracheal deviation  present. No thyromegaly present.  No cervical lymphadenopathy Cardiovascular: Normal rate, regular rhythm and normal heart sounds.   No murmur heard. No carotid bruit .  No edema Pulmonary/Chest: Effort normal and breath sounds normal. No respiratory distress. No has no wheezes. No rales.  Skin: Skin is warm and dry. Not diaphoretic.  Psychiatric: Normal mood and affect. Behavior is normal.      Assessment & Plan:    See Problem List for Assessment and Plan of chronic medical problems.

## 2017-05-13 NOTE — Patient Instructions (Signed)
Good to see you  Once weekly vitamin D for 12 weeks.  Ice 20 minutes 2 times daily. Usually after activity and before bed. Exercises 3 times a week.  pennsaid pinkie amount topically 2 times daily as needed.  Over the counter get  Turmeric  daily  Tart cherry extract any dose at night See me again in 4 weeks.

## 2017-05-13 NOTE — Progress Notes (Signed)
Megan Robbins [address], [place] [address] Phone: [phone] Subjective:    I'm seeing this patient by the request  of:  Megan Sanes, MD   CC: Back pain  [id number]  Megan Robbins is a 81 y.o. female coming in with complaint of back pain. Has been going on multiple weeks. Seem to be any worse and went to urgent care on 04/26/2017. 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 Felt an audible pop and pain occurred a most immediately. Patient is having pain radiating down both legs. Unfortunately had to go to the emergency room on that day as well. Given some Robbins and then followed up with primary care provider. Primary care provider did get x-rays. These were independently visualized by me showing the patient does have likely an anterior small compression fracture at L3. Also has degenerative disc disease at L4 on L5 but seems to be stable from 2015. Patient states that she has been in chronic pain. She is concerned with her blood pressure but feels that she is having high blood pressure due to all of the stress in her life.   Onset- chronic Location- lumbar spine Duration- constant Character-Dull, throbbing aching pain Aggravating factors- Reliving factors- nothing so far Therapies tried- some ice and heat with minimal improvement tramadol does help Severity-7-8 out of 10     Past Medical History:  Diagnosis Date  . Anxiety   . Hypertension    Past Surgical History:  Procedure Laterality Date  . ABDOMINAL HYSTERECTOMY    . APPENDECTOMY    . FOOT SURGERY    . HEEL SPUR SURGERY    . TONSILLECTOMY     Social History   Social History  . Marital status: Married    Spouse name: N/A  . Number of children: N/A  . Years of education: N/A   Social History Main Topics  . Smoking status: Never Smoker  . Smokeless tobacco: Never Used  . Alcohol use No  . Drug use: No   . Sexual activity: Yes    Birth control/ protection: Post-menopausal   Other Topics Concern  . Not on file   Social History Narrative   No regular exercise   No Known Allergies Family History  Problem Relation Age of Onset  . Hyperlipidemia Mother   . Hypertension Mother   . Diabetes Daughter   . Diabetes Son      Past medical history, social, surgical and family history all reviewed in electronic medical record.  No pertanent information unless stated regarding to the chief complaint.   Review of Systems:Review of systems updated and as accurate as of 05/13/17  No headache, visual changes, nausea, vomiting, diarrhea, constipation, dizziness, abdominal pain, skin rash, fevers, chills, night sweats, weight loss, swollen lymph nodes, body aches, joint swellingchest pain, shortness of breath, mood changes. Positive muscle aches  Objective  There were no vitals taken for this visit. Systems examined below as of 05/13/17   General: No apparent distress alert and oriented x3 mood and affect normal, dressed appropriately.  HEENT: Pupils equal, extraocular movements intact  Respiratory: Patient's speak in full sentences and does not appear short of breath  Cardiovascular: No lower extremity edema, non tender, no erythema  Skin: Warm dry intact with no signs of infection or rash on extremities or on axial skeleton.  Abdomen: Soft nontender  Neuro: Cranial nerves II through  XII are intact, neurovascularly intact in all extremities with 2+ DTRs and 2+ pulses.  Lymph: No lymphadenopathy of posterior or anterior cervical chain or axillae bilaterally.  Gait normal with good balance and coordination.  MSK:  Non tender with full range of motion and good stability and symmetric strength and tone of shoulders, elbows, wrist, hip, knee and ankles bilaterally.  Back Exam:  Inspection: Mild degenerative scoliosis Motion: Flexion 45 deg, Extension 25 deg, Side Bending to 35 deg bilaterally,    Rotation to 35 deg bilaterally  SLR laying: Negative  XSLR laying: Negative  Palpable tenderness: Mild tender to palpation in the paraspinal musculature of the lumbar spine mostly from L3-S1 bilaterally. FABER: Positive bilateral. Sensory change: Gross sensation intact to all lumbar and sacral dermatomes.  Reflexes: 2+ at both patellar tendons, 2+ at achilles tendons, Babinski's downgoing.  Strength at foot  Plantar-flexion: 5/5 Dorsi-flexion: 5/5 Eversion: 5/5 Inversion: 5/5  Leg strength  Quad: 5/5 Hamstring: 5/5 Hip flexor: 5/5 Hip abductors: 5/5  Gait unremarkable.  Procedure 97110; 15 additional minutes spent for Therapeutic exercises as stated in above notes.  This included exercises focusing on stretching, strengthening, with significant focus on eccentric aspects.   Long term goals include an improvement in range of motion, strength, endurance as well as avoiding reinjury. Patient's frequency would include in 1-2 times a day, 3-5 times a week for a duration of 6-12 weeks. Low back exercises that included:  Pelvic tilt/bracing instruction to focus on control of the pelvic girdle and lower abdominal muscles  Glute strengthening exercises, focusing on proper firing of the glutes without engaging the low back muscles Proper stretching techniques for maximum relief for the hamstrings, hip flexors, low back and some rotation where tolerated   Proper technique shown and discussed handout in great detail with ATC.  All questions were discussed and answered.     Impression and Recommendations:     This case required medical decision making of moderate complexity.      Note: This dictation was prepared with Dragon dictation along with smaller phrase technology. Any transcriptional errors that result from this process are unintentional.

## 2017-05-13 NOTE — Assessment & Plan Note (Signed)
Compression fracture noted. We discussed with patient at great length about icing regimen. Patient will do a once weekly vitamin D. With this being the potential we will consider possibly getting a bone density test in the near future. I do not take any other significant changes in management would be helpful but was given home exercises. Patient has difficulty doing this distal therapy could be beneficial. Patient continues to be very active and is the primary caregiver for another individual that makes this somewhat difficult. Patient will follow-up with me again in 4 weeks to make sure we are making progress.

## 2017-06-10 ENCOUNTER — Ambulatory Visit: Payer: Self-pay | Admitting: Family Medicine

## 2017-06-20 NOTE — Progress Notes (Deleted)
Megan ScaleZach Jeslin Robbins D.O. [name] [address] [place], [place] [address] Phone: [phone] Subjective:    I'm seeing this patient by the request  of:    CC: Back pain follow-up  BJY:[id number]:Subjective  Megan Robbins is a 81 y.o. female coming in with complaint of back pain.  Was found to have a compression fracture at L3 as well as sacroiliac dysfunction.  Started home exercises and once weekly vitamin D.  Patient states    Past Medical History:  Diagnosis Date  . Anxiety   . Hypertension    Past Surgical History:  Procedure Laterality Date  . ABDOMINAL HYSTERECTOMY    . APPENDECTOMY    . FOOT SURGERY    . HEEL SPUR SURGERY    . TONSILLECTOMY     Social History   Socioeconomic History  . Marital status: Married    Spouse name: Not on file  . Number of children: Not on file  . Years of education: Not on file  . Highest education level: Not on file  Social Needs  . Financial resource strain: Not on file  . Food insecurity - worry: Not on file  . Food insecurity - inability: Not on file  . Transportation needs - medical: Not on file  . Transportation needs - non-medical: Not on file  Occupational History  . Not on file  Tobacco Use  . Smoking status: Never Smoker  . Smokeless tobacco: Never Used  Substance and Sexual Activity  . Alcohol use: No  . Drug use: No  . Sexual activity: Yes    Birth control/protection: Post-menopausal  Other Topics Concern  . Not on file  Social History Narrative   No regular exercise   No Known Allergies Family History  Problem Relation Age of Onset  . Hyperlipidemia Mother   . Hypertension Mother   . Diabetes Daughter   . Diabetes Son      Past medical history, social, surgical and family history all reviewed in electronic medical record.  No pertanent information unless stated regarding to the chief complaint.   Review of Systems:Review of systems updated and as accurate as of 06/20/17  No headache, visual changes,  nausea, vomiting, diarrhea, constipation, dizziness, abdominal pain, skin rash, fevers, chills, night sweats, weight loss, swollen lymph nodes, body aches, joint swelling, muscle aches, chest pain, shortness of breath, mood changes.   Objective  There were no vitals taken for this visit. Systems examined below as of 06/20/17   General: No apparent distress alert and oriented x3 mood and affect normal, dressed appropriately.  HEENT: Pupils equal, extraocular movements intact  Respiratory: Patient's speak in full sentences and does not appear short of breath  Cardiovascular: No lower extremity edema, non tender, no erythema  Skin: Warm dry intact with no signs of infection or rash on extremities or on axial skeleton.  Abdomen: Soft nontender  Neuro: Cranial nerves II through XII are intact, neurovascularly intact in all extremities with 2+ DTRs and 2+ pulses.  Lymph: No lymphadenopathy of posterior or anterior cervical chain or axillae bilaterally.  Gait normal with good balance and coordination.  MSK:  Non tender with full range of motion and good stability and symmetric strength and tone of shoulders, elbows, wrist, hip, knee and ankles bilaterally.     Impression and Recommendations:     This case required medical decision making of moderate complexity.      Note: This dictation was prepared with Dragon dictation along with smaller phrase  technology. Any transcriptional errors that result from this process are unintentional.

## 2017-06-22 ENCOUNTER — Ambulatory Visit: Payer: Self-pay | Admitting: Family Medicine

## 2017-06-29 ENCOUNTER — Other Ambulatory Visit: Payer: Self-pay | Admitting: Internal Medicine

## 2017-08-05 ENCOUNTER — Ambulatory Visit: Payer: Self-pay | Admitting: Family Medicine

## 2017-08-05 NOTE — Progress Notes (Deleted)
Megan Robbins D.O. [facility] [address] [place], [place] [address] Phone: [phone] Subjective:    I'm seeing this patient by the request  of:    CC: Back pain follow-up  BJY:NWGNFAOZHYHPI:Subjective  Megan Robbins is a 82 y.o. female coming in with complaint of back pain.  Found to have a compression fracture as well as sacroiliac dysfunction.  Given home exercises and started on once weekly vitamin D.  Patient states  Onset-  Location Duration-  Character- Aggravating factors- Reliving factors-  Therapies tried-  Severity-   Previous lumbar x-rays were taken May 08, 2017.  Found to have an L3 compression fracture at that time.  Also had degenerative disc disease at L4 on L5.  Past Medical History:  Diagnosis Date  . Anxiety   . Hypertension    Past Surgical History:  Procedure Laterality Date  . ABDOMINAL HYSTERECTOMY    . APPENDECTOMY    . FOOT SURGERY    . HEEL SPUR SURGERY    . TONSILLECTOMY     Social History   Socioeconomic History  . Marital status: Married    Spouse name: Not on file  . Number of children: Not on file  . Years of education: Not on file  . Highest education level: Not on file  Social Needs  . Financial resource strain: Not on file  . Food insecurity - worry: Not on file  . Food insecurity - inability: Not on file  . Transportation needs - medical: Not on file  . Transportation needs - non-medical: Not on file  Occupational History  . Not on file  Tobacco Use  . Smoking status: Never Smoker  . Smokeless tobacco: Never Used  Substance and Sexual Activity  . Alcohol use: No  . Drug use: No  . Sexual activity: Yes    Birth control/protection: Post-menopausal  Other Topics Concern  . Not on file  Social History Narrative   No regular exercise   No Known Allergies Family History  Problem Relation Age of Onset  . Hyperlipidemia Mother   . Hypertension Mother   . Diabetes Daughter   . Diabetes Son      Past medical  history, social, surgical and family history all reviewed in electronic medical record.  No pertanent information unless stated regarding to the chief complaint.   Review of Systems:Review of systems updated and as accurate as of 08/05/17  No headache, visual changes, nausea, vomiting, diarrhea, constipation, dizziness, abdominal pain, skin rash, fevers, chills, night sweats, weight loss, swollen lymph nodes, body aches, joint swelling, muscle aches, chest pain, shortness of breath, mood changes.   Objective  There were no vitals taken for this visit. Systems examined below as of 08/05/17   General: No apparent distress alert and oriented x3 mood and affect normal, dressed appropriately.  HEENT: Pupils equal, extraocular movements intact  Respiratory: Patient's speak in full sentences and does not appear short of breath  Cardiovascular: No lower extremity edema, non tender, no erythema  Skin: Warm dry intact with no signs of infection or rash on extremities or on axial skeleton.  Abdomen: Soft nontender  Neuro: Cranial nerves II through XII are intact, neurovascularly intact in all extremities with 2+ DTRs and 2+ pulses.  Lymph: No lymphadenopathy of posterior or anterior cervical chain or axillae bilaterally.  Gait normal with good balance and coordination.  MSK:  Non tender with full range of motion and good stability and symmetric strength and tone of shoulders,  elbows, wrist, hip, knee and ankles bilaterally.      Impression and Recommendations:     This case required medical decision making of moderate complexity.      Note: This dictation was prepared with Dragon dictation along with smaller phrase technology. Any transcriptional errors that result from this process are unintentional.

## 2017-08-17 ENCOUNTER — Ambulatory Visit: Payer: Medicare Other | Admitting: Podiatry

## 2017-08-17 ENCOUNTER — Encounter: Payer: Self-pay | Admitting: Podiatry

## 2017-08-17 DIAGNOSIS — M722 Plantar fascial fibromatosis: Secondary | ICD-10-CM | POA: Diagnosis not present

## 2017-08-17 MED ORDER — MELOXICAM 15 MG PO TABS: 15.0000 mg | ORAL_TABLET | Freq: Every day | ORAL | 1 refills | Status: AC

## 2017-08-19 NOTE — Progress Notes (Signed)
   Subjective: 82 year old female presents today for follow-up treatment and evaluation regarding chronic plantar fasciitis to the bilateral feet.  She having a flareup that began 2-3 weeks ago.  She was unable to have surgery because her husband was in an automobile accident.  Patient is here for further evaluation and treatment.   Past Medical History:  Diagnosis Date  . Anxiety   . Hypertension     Objective: Physical Exam General: The patient is alert and oriented x3 in no acute distress.  Dermatology: Skin is warm, dry and supple bilateral lower extremities. Negative for open lesions or macerations bilateral.   Vascular: Dorsalis Pedis and Posterior Tibial pulses palpable bilateral.  Capillary fill time is immediate to all digits.  Neurological: Epicritic and protective threshold intact bilateral.   Musculoskeletal: Tenderness to palpation at the medial calcaneal tubercale and through the insertion of the plantar fascia of the bilateral feet. All other joints range of motion within normal limits bilateral. Strength 5/5 in all groups bilateral.   Assessment: 1. plantar fasciitis bilateral feet  Plan of Care:  1. Patient evaluated.  2. Injection of 0.5 mL Celestone Soluspan injected in the patient's bilateral heels 3.  Refill prescription for meloxicam 15 mg provided to patient. 4.  Patient needs to reschedule surgery.  Husband was involved in an auto accident. 5.  Return to clinic as needed.   Felecia ShellingBrent M. Shubham Thackston, DPM [facility]  Dr. Felecia ShellingBrent M. Zayven Powe, DPM    [address]                                   [place], [place] [address]                Office [phone](336) [phone]  Fax [phone](336) [phone]

## 2017-09-04 ENCOUNTER — Other Ambulatory Visit: Payer: Self-pay | Admitting: Internal Medicine

## 2017-09-04 DIAGNOSIS — I10 Essential (primary) hypertension: Secondary | ICD-10-CM

## 2017-09-14 ENCOUNTER — Ambulatory Visit: Payer: Medicare Other | Admitting: Podiatry

## 2017-09-28 ENCOUNTER — Ambulatory Visit (INDEPENDENT_AMBULATORY_CARE_PROVIDER_SITE_OTHER): Payer: Medicare Other | Admitting: Podiatry

## 2017-09-28 DIAGNOSIS — M722 Plantar fascial fibromatosis: Secondary | ICD-10-CM | POA: Diagnosis not present

## 2017-09-29 NOTE — Progress Notes (Signed)
   Subjective: 82 year old female presents today for follow-up treatment and evaluation regarding chronic plantar fasciitis to the bilateral feet. She states the pain has improved but is still present almost constantly.She states the pain is worse when she first wakes in the morning. Patient is here for further evaluation and treatment.   Past Medical History:  Diagnosis Date  . Anxiety   . Hypertension     Objective: Physical Exam General: The patient is alert and oriented x3 in no acute distress.  Dermatology: Skin is warm, dry and supple bilateral lower extremities. Negative for open lesions or macerations bilateral.   Vascular: Dorsalis Pedis and Posterior Tibial pulses palpable bilateral.  Capillary fill time is immediate to all digits.  Neurological: Epicritic and protective threshold intact bilateral.   Musculoskeletal: Tenderness to palpation at the medial calcaneal tubercale and through the insertion of the plantar fascia of the bilateral feet. All other joints range of motion within normal limits bilateral. Strength 5/5 in all groups bilateral.   Assessment: 1. plantar fasciitis bilateral feet  Plan of Care:  1. Patient evaluated.  2. Injection of 0.5 mL Celestone Soluspan injected in the patient's bilateral heels 3. Continue wearing good tennis shoes and taking Mobic 15 mg. 4. Patient scheduled for cataract eye surgery. She is planning for EPF surgery afterwards.  5. Return to clinic in 4 weeks.   Felecia ShellingBrent M. Trent Gabler, DPM [facility]  Dr. Felecia ShellingBrent M. Mylisa Brunson, DPM    [address]                                   [place], [place] [address]                Office [phone](336) [phone]  Fax [phone](336) [phone]

## 2017-10-28 ENCOUNTER — Encounter: Payer: Self-pay | Admitting: Podiatry

## 2017-10-28 ENCOUNTER — Ambulatory Visit: Payer: Medicare Other | Admitting: Podiatry

## 2017-10-28 DIAGNOSIS — M722 Plantar fascial fibromatosis: Secondary | ICD-10-CM | POA: Diagnosis not present

## 2017-10-28 NOTE — Patient Instructions (Signed)
Pre-Operative Instructions  Congratulations, you have decided to take an important step towards improving your quality of life.  You can be assured that the doctors and staff at [facility] will be with you every step of the way.  Here are some important things you should know:  1. Plan to be at the surgery center/hospital at least 1 (one) hour prior to your scheduled time, unless otherwise directed by the surgical center/hospital staff.  You must have a responsible adult accompany you, remain during the surgery and drive you home.  Make sure you have directions to the surgical center/hospital to ensure you arrive on time. 2. If you are having surgery at [facility] or [facility], you will need a copy of your medical history and physical form from your family physician within one month prior to the date of surgery. We will give you a form for your primary physician to complete.  3. We make every effort to accommodate the date you request for surgery.  However, there are times where surgery dates or times have to be moved.  We will contact you as soon as possible if a change in schedule is required.   4. No aspirin/ibuprofen for one week before surgery.  If you are on aspirin, any non-steroidal anti-inflammatory medications (Mobic, Aleve, Ibuprofen) should not be taken seven (7) days prior to your surgery.  You make take Tylenol for pain prior to surgery.  5. Medications - If you are taking daily heart and blood pressure medications, seizure, reflux, allergy, asthma, anxiety, pain or diabetes medications, make sure you notify the surgery center/hospital before the day of surgery so they can tell you which medications you should take or avoid the day of surgery. 6. No food or drink after midnight the night before surgery unless directed otherwise by surgical center/hospital staff. 7. No alcoholic beverages 24-hours prior to surgery.  No smoking 24-hours prior or 24-hours after  surgery. 8. Wear loose pants or shorts. They should be loose enough to fit over bandages, boots, and casts. 9. Don't wear slip-on shoes. Sneakers are preferred. 10. Bring your boot with you to the surgery center/hospital.  Also bring crutches or a walker if your physician has prescribed it for you.  If you do not have this equipment, it will be provided for you after surgery. 11. If you have not been contacted by the surgery center/hospital by the day before your surgery, call to confirm the date and time of your surgery. 12. Leave-time from work may vary depending on the type of surgery you have.  Appropriate arrangements should be made prior to surgery with your employer. 13. Prescriptions will be provided immediately following surgery by your doctor.  Fill these as soon as possible after surgery and take the medication as directed. Pain medications will not be refilled on weekends and must be approved by the doctor. 14. Remove nail polish on the operative foot and avoid getting pedicures prior to surgery. 15. Wash the night before surgery.  The night before surgery wash the foot and leg well with water and the antibacterial soap provided. Be sure to pay special attention to beneath the toenails and in between the toes.  Wash for at least three (3) minutes. Rinse thoroughly with water and dry well with a towel.  Perform this wash unless told not to do so by your physician.  Enclosed: 1 Ice pack (please put in freezer the night before surgery)   1 Hibiclens skin cleaner     Pre-op instructions  If you have any questions regarding the instructions, please do not hesitate to call our office.  [place]: [address], [place], [place] [address] -- [phone]  [place]: [address] [place], [place] [address] -- [phone]  [place]: [address]  [place], [place] [address] -- [phone]  [place]: [address], [place], [place] [address] -- [phone]  Website:  [email or web address] 

## 2017-11-03 NOTE — Progress Notes (Signed)
   Subjective: 82 year old female presenting today for follow up evaluation of bilateral heel pain secondary to plantar fasciitis. She reports continued pain. She states she experienced some relief after receiving the injection at the previous visit, but it has since returned. She is interested in surgical intervention at this point. Patient is here for further evaluation and treatment.   Past Medical History:  Diagnosis Date  . Anxiety   . Hypertension      Objective: Physical Exam General: The patient is alert and oriented x3 in no acute distress.  Dermatology: Skin is warm, dry and supple bilateral lower extremities. Negative for open lesions or macerations bilateral.   Vascular: Dorsalis Pedis and Posterior Tibial pulses palpable bilateral.  Capillary fill time is immediate to all digits.  Neurological: Epicritic and protective threshold intact bilateral.   Musculoskeletal: Tenderness to palpation to the plantar aspect of the bilateral heels along the plantar fascia. All other joints range of motion within normal limits bilateral. Strength 5/5 in all groups bilateral.   Assessment: 1. plantar fasciitis bilateral feet - chronic  Plan of Care:  1. Patient evaluated.  2. Today we discussed the conservative versus surgical management of the presenting pathology. The patient opts for surgical management. All possible complications and details of the procedure were explained. All patient questions were answered. No guarantees were expressed or implied. 3. Authorization for surgery was initiated today. Surgery will consist of EPF bilaterally. 4. Return to clinic one week post op.    Felecia ShellingBrent M. Alexei Doswell, DPM [facility]  Dr. Felecia ShellingBrent M. Dashanae Longfield, DPM    [address]                                   [place], [place] [address]                Office [phone](336) [phone]  Fax [phone](336) [phone]

## 2017-12-05 ENCOUNTER — Other Ambulatory Visit: Payer: Self-pay | Admitting: Internal Medicine

## 2017-12-05 DIAGNOSIS — I10 Essential (primary) hypertension: Secondary | ICD-10-CM

## 2017-12-09 DIAGNOSIS — H35353 Cystoid macular degeneration, bilateral: Secondary | ICD-10-CM | POA: Diagnosis not present

## 2017-12-09 DIAGNOSIS — Z961 Presence of intraocular lens: Secondary | ICD-10-CM | POA: Diagnosis not present

## 2018-01-06 ENCOUNTER — Other Ambulatory Visit: Payer: Self-pay | Admitting: Internal Medicine

## 2018-01-06 DIAGNOSIS — I10 Essential (primary) hypertension: Secondary | ICD-10-CM

## 2018-01-07 ENCOUNTER — Telehealth: Payer: Self-pay | Admitting: Internal Medicine

## 2018-01-07 NOTE — Telephone Encounter (Signed)
Copied from CRM [id number]. Topic: Quick Communication - Rx Refill/Question >> Jan 07, 2018  2:57 PM Eston Mouldavis, Megan Robbins wrote: Medication: atenolol (TENORMIN) 50 MG tablet  Has the patient contacted their pharmacy? {no (Agent: If no, request that the patient contact the pharmacy for the refill.) (Agent: If yes, when and what did the pharmacy advise?)  Preferred Pharmacy (with phone number or street name): [facility] [id number] - [place], [place] - [address] [phone] (Phone) [phone] (Fax)      Agent: Please be advised that RX refills may take up to 3 business days. We ask that you follow-up with your pharmacy.

## 2018-01-07 NOTE — Telephone Encounter (Signed)
See refill request for 01/06/18. Pt needs to scheduled OV in order to receive refills on requested medication.

## 2018-01-08 ENCOUNTER — Other Ambulatory Visit: Payer: Self-pay | Admitting: *Deleted

## 2018-01-08 DIAGNOSIS — I10 Essential (primary) hypertension: Secondary | ICD-10-CM

## 2018-01-08 MED ORDER — ATENOLOL 50 MG PO TABS: 50.0000 mg | ORAL_TABLET | Freq: Every day | ORAL | 0 refills | Status: DC

## 2018-01-08 NOTE — Telephone Encounter (Signed)
Call to patient- scheduled appointment for Monday- medication follow up. #15 sent to pharmacy per protocol.

## 2018-01-10 NOTE — Progress Notes (Signed)
Subjective:    Patient ID: Megan Robbins, female    DOB: 01-15-1936, 82 y.o.   MRN: [id number]  HPI   Patient Active Problem List   Diagnosis Date Noted  . Function kidney decreased 05/13/2017  . Compression fracture of L3 lumbar vertebra 05/13/2017  . Lower back pain 05/08/2017  . Stress and adjustment reaction 05/08/2017  . Overweight 05/23/2015  . Essential hypertension, benign 05/08/2015    Current Outpatient Medications on File Prior to Visit  Medication Sig Dispense Refill  . amLODipine (NORVASC) 2.5 MG tablet Take 1 tablet (2.5 mg total) by mouth daily. 90 tablet 3  . atenolol (TENORMIN) 50 MG tablet Take 1 tablet (50 mg total) by mouth daily. -- Office visit needed for further refills 15 tablet 0  . ipratropium (ATROVENT) 0.03 % nasal spray USE 2 SPRAYS IN NOSTRILS 4 TIMES DAILY  0  . loratadine (CLARITIN) 10 MG tablet Take 10 mg by mouth daily.    Marland Kitchen. losartan-hydrochlorothiazide (HYZAAR) 100-25 MG tablet take 1 tablet by mouth once daily 30 tablet 5  . NONFORMULARY OR COMPOUNDED ITEM Apply 1-2 g topically 4 (four) times daily. 120 each 2  . Olopatadine HCl (PAZEO) 0.7 % SOLN Apply 1 drop to eye daily. 1 Bottle 2  . traMADol (ULTRAM) 50 MG tablet Take 1 tablet (50 mg total) by mouth every 8 (eight) hours as needed. 30 tablet 0  . Vitamin D, Ergocalciferol, (DRISDOL) 50000 units CAPS capsule Take 1 capsule (50,000 Units total) by mouth every 7 (seven) days. 12 capsule 0   No current facility-administered medications on file prior to visit.     Past Medical History:  Diagnosis Date  . Anxiety   . Hypertension     Past Surgical History:  Procedure Laterality Date  . ABDOMINAL HYSTERECTOMY    . APPENDECTOMY    . FOOT SURGERY    . HEEL SPUR SURGERY    . TONSILLECTOMY      Social History   Socioeconomic History  . Marital status: Married    Spouse name: Not on file  . Number of children: Not on file  . Years of education: Not on file  . Highest education  level: Not on file  Occupational History  . Not on file  Social Needs  . Financial resource strain: Not on file  . Food insecurity:    Worry: Not on file    Inability: Not on file  . Transportation needs:    Medical: Not on file    Non-medical: Not on file  Tobacco Use  . Smoking status: Never Smoker  . Smokeless tobacco: Never Used  Substance and Sexual Activity  . Alcohol use: No  . Drug use: No  . Sexual activity: Yes    Birth control/protection: Post-menopausal  Lifestyle  . Physical activity:    Days per week: Not on file    Minutes per session: Not on file  . Stress: Not on file  Relationships  . Social connections:    Talks on phone: Not on file    Gets together: Not on file    Attends religious service: Not on file    Active member of club or organization: Not on file    Attends meetings of clubs or organizations: Not on file    Relationship status: Not on file  Other Topics Concern  . Not on file  Social History Narrative   No regular exercise    Family History  Problem Relation Age  of Onset  . Hyperlipidemia Mother   . Hypertension Mother   . Diabetes Daughter   . Diabetes Son     Review of Systems     Objective:  There were no vitals filed for this visit. BP Readings from Last 3 Encounters:  05/13/17 (!) 142/82  05/08/17 (!) 172/98  04/26/17 (!) 185/89   Wt Readings from Last 3 Encounters:  05/13/17 175 lb (79.4 kg)  05/08/17 175 lb (79.4 kg)  12/27/16 176 lb (79.8 kg)   There is no height or weight on file to calculate BMI.   Physical Exam       Assessment & Plan:    See Problem List for Assessment and Plan of chronic medical problems.   This encounter was created in error - please disregard.

## 2018-01-10 NOTE — Patient Instructions (Addendum)
  Test(s) ordered today. Your results will be released to MyChart (or called to you) after review, usually within 72hours after test completion. If any changes need to be made, you will be notified at that same time.  All other Health Maintenance issues reviewed.   All recommended immunizations and age-appropriate screenings are up-to-date or discussed.  No immunizations administered today.   Medications reviewed and updated.  Changes include  /  No changes recommended at this time.  Your prescription(s) have been submitted to your pharmacy. Please take as directed and contact our office if you believe you are having problem(s) with the medication(s).  A referral was ordered for   Please followup in 6 months with me;  Please schedule a wellness visit with our nurse Noreene LarssonJill

## 2018-01-11 ENCOUNTER — Encounter: Payer: Self-pay | Admitting: Internal Medicine

## 2018-01-17 NOTE — Progress Notes (Signed)
Subjective:    Patient ID: Megan Robbins, female    DOB: May 18, 1936, 82 y.o.   MRN: [id number]  HPI The patient is here for follow up.    Hypertension: She is taking her medication daily-only taking amlodipine 2.5 mg daily and atenolol 50 mg daily.  Losartan-hydrochlorothiazide is on her list, but she states the pharmacy said it was discontinued and she has not been taking it. She is compliant with a low sodium diet.  She denies chest pain, palpitations, edema, shortness of breath and regular headaches. She is not exercising regularly.  She does not monitor her blood pressure at home.    Compression fracture of L3: Last fall she was found to have a compression fracture after she helped her sister up off the floor.  She denies any current pain.  She has not had a bone density scan.  At this point she does not feel that she needs one.  Decreased GFR: Her last blood work showed that her kidney function is significantly decreased.  She was not aware of this.  Medications and allergies reviewed with patient and updated if appropriate.  Patient Active Problem List   Diagnosis Date Noted  . Function kidney decreased 05/13/2017  . Compression fracture of L3 vertebra (HCC) 05/13/2017  . Lower back pain 05/08/2017  . Stress and adjustment reaction 05/08/2017  . Overweight 05/23/2015  . Essential hypertension, benign 05/08/2015    Current Outpatient Medications on File Prior to Visit  Medication Sig Dispense Refill  . amLODipine (NORVASC) 2.5 MG tablet Take 1 tablet (2.5 mg total) by mouth daily. 90 tablet 3  . atenolol (TENORMIN) 50 MG tablet Take 1 tablet (50 mg total) by mouth daily. -- Office visit needed for further refills 15 tablet 0  . ipratropium (ATROVENT) 0.03 % nasal spray USE 2 SPRAYS IN NOSTRILS 4 TIMES DAILY  0  . loratadine (CLARITIN) 10 MG tablet Take 10 mg by mouth daily.    Marland Kitchen losartan-hydrochlorothiazide (HYZAAR) 100-25 MG tablet take 1 tablet by mouth once daily 30  tablet 5  . NONFORMULARY OR COMPOUNDED ITEM Apply 1-2 g topically 4 (four) times daily. 120 each 2  . Olopatadine HCl (PAZEO) 0.7 % SOLN Apply 1 drop to eye daily. 1 Bottle 2  . traMADol (ULTRAM) 50 MG tablet Take 1 tablet (50 mg total) by mouth every 8 (eight) hours as needed. 30 tablet 0  . Vitamin D, Ergocalciferol, (DRISDOL) 50000 units CAPS capsule Take 1 capsule (50,000 Units total) by mouth every 7 (seven) days. 12 capsule 0   No current facility-administered medications on file prior to visit.     Past Medical History:  Diagnosis Date  . Anxiety   . Hypertension     Past Surgical History:  Procedure Laterality Date  . ABDOMINAL HYSTERECTOMY    . APPENDECTOMY    . FOOT SURGERY    . HEEL SPUR SURGERY    . TONSILLECTOMY      Social History   Socioeconomic History  . Marital status: Married    Spouse name: Not on file  . Number of children: Not on file  . Years of education: Not on file  . Highest education level: Not on file  Occupational History  . Not on file  Social Needs  . Financial resource strain: Not on file  . Food insecurity:    Worry: Not on file    Inability: Not on file  . Transportation needs:    Medical: Not on  file    Non-medical: Not on file  Tobacco Use  . Smoking status: Never Smoker  . Smokeless tobacco: Never Used  Substance and Sexual Activity  . Alcohol use: No  . Drug use: No  . Sexual activity: Yes    Birth control/protection: Post-menopausal  Lifestyle  . Physical activity:    Days per week: Not on file    Minutes per session: Not on file  . Stress: Not on file  Relationships  . Social connections:    Talks on phone: Not on file    Gets together: Not on file    Attends religious service: Not on file    Active member of club or organization: Not on file    Attends meetings of clubs or organizations: Not on file    Relationship status: Not on file  Other Topics Concern  . Not on file  Social History Narrative   No regular  exercise    Family History  Problem Relation Age of Onset  . Hyperlipidemia Mother   . Hypertension Mother   . Diabetes Daughter   . Diabetes Son     Review of Systems  Constitutional: Negative for fever.  Respiratory: Negative for cough, shortness of breath and wheezing.   Cardiovascular: Negative for chest pain, palpitations and leg swelling.  Neurological: Negative for light-headedness and headaches.       Objective:   Vitals:   01/18/18 1542  BP: (!) 200/100  Pulse: 63  Resp: 16  Temp: 98.6 F (37 C)  SpO2: 98%   BP Readings from Last 3 Encounters:  01/18/18 (!) 200/100  05/13/17 (!) 142/82  05/08/17 (!) 172/98   Wt Readings from Last 3 Encounters:  01/18/18 180 lb (81.6 kg)  05/13/17 175 lb (79.4 kg)  05/08/17 175 lb (79.4 kg)   Body mass index is 31.89 kg/m.   Physical Exam    Constitutional: Appears well-developed and well-nourished. No distress.  HENT:  Head: Normocephalic and atraumatic.  Neck: Neck supple. No tracheal deviation present. No thyromegaly present.  No cervical lymphadenopathy Cardiovascular: Normal rate, regular rhythm and normal heart sounds.   2/6 systolic murmur heard. No carotid bruit .  No edema Pulmonary/Chest: Effort normal and breath sounds normal. No respiratory distress. No has no wheezes. No rales.  Skin: Skin is warm and dry. Not diaphoretic.  Psychiatric: Normal mood and affect. Behavior is normal.      Assessment & Plan:    See Problem List for Assessment and Plan of chronic medical problems.

## 2018-01-18 ENCOUNTER — Ambulatory Visit (INDEPENDENT_AMBULATORY_CARE_PROVIDER_SITE_OTHER): Payer: Medicare Other | Admitting: Internal Medicine

## 2018-01-18 ENCOUNTER — Encounter: Payer: Self-pay | Admitting: Internal Medicine

## 2018-01-18 VITALS — BP 200/100 | HR 63 | Temp 98.6°F | Resp 16 | Wt 180.0 lb

## 2018-01-18 DIAGNOSIS — N289 Disorder of kidney and ureter, unspecified: Secondary | ICD-10-CM

## 2018-01-18 DIAGNOSIS — S32030D Wedge compression fracture of third lumbar vertebra, subsequent encounter for fracture with routine healing: Secondary | ICD-10-CM | POA: Diagnosis not present

## 2018-01-18 DIAGNOSIS — I10 Essential (primary) hypertension: Secondary | ICD-10-CM | POA: Diagnosis not present

## 2018-01-18 MED ORDER — HYDRALAZINE HCL 25 MG PO TABS: 25.0000 mg | ORAL_TABLET | Freq: Three times a day (TID) | ORAL | 5 refills | Status: DC

## 2018-01-18 MED ORDER — ATENOLOL 50 MG PO TABS: 50.0000 mg | ORAL_TABLET | Freq: Every day | ORAL | 5 refills | Status: DC

## 2018-01-18 MED ORDER — AMLODIPINE BESYLATE 5 MG PO TABS: 5.0000 mg | ORAL_TABLET | Freq: Every day | ORAL | 5 refills | Status: DC

## 2018-01-18 NOTE — Assessment & Plan Note (Signed)
Discussed with her that her kidney function was significantly reduced and that we will need to recheck this in 1 month Stressed the importance of getting her blood pressure well controlled Follow-up in 4 weeks

## 2018-01-18 NOTE — Assessment & Plan Note (Signed)
Currently with no back pain Deferred DEXA We will discuss again in 1 month-stressed that this should be done because she is at risk for other fractures depending on her bone density Encouraged regular exercise

## 2018-01-18 NOTE — Assessment & Plan Note (Signed)
She is nervous to be here, which is likely part of the reason why her blood pressure is still elevated, but I also feel her blood pressure is poorly controlled Stressed that she needs to check her blood pressure regularly at home Increase amlodipine to 5 mg daily Continue atenolol 50 mg daily Start hydralazine 25 mg 3 times daily She is compliant with a low-sodium diet She is not currently exercising-encouraged regular exercise Follow-up in 4 weeks We will recheck blood work at that time

## 2018-01-18 NOTE — Patient Instructions (Signed)
Increase amlodipine to 5 mg daily.  Continue atenolol 50 mg daily  Start hydralazine 25 mg three times a day.     Start monitoring your BP daily and keep a log and bring it with you.    Start regular exercise   Follow up in 4 weeks.

## 2018-01-19 ENCOUNTER — Telehealth: Payer: Self-pay | Admitting: Internal Medicine

## 2018-01-19 NOTE — Telephone Encounter (Signed)
Spoke with pt to inform that she should continue all medications in addition to the ones she was placed on by Dr Tawni MillersBunrs yesterday.

## 2018-01-19 NOTE — Telephone Encounter (Signed)
Copied from CRM [id number]. Topic: Quick Communication - See Telephone Encounter >> Jan 19, 2018 10:07 AM Jay SchlichterWeikart, Melissa J wrote: CRM for notification. See Telephone encounter for: 01/19/18. Pt called - she was given a new medication yesterday and has questions on whether or not she should take the previous medications  Please call [phone]

## 2018-02-17 ENCOUNTER — Encounter (HOSPITAL_COMMUNITY): Payer: Self-pay | Admitting: Emergency Medicine

## 2018-02-17 ENCOUNTER — Other Ambulatory Visit: Payer: Self-pay

## 2018-02-17 ENCOUNTER — Ambulatory Visit: Payer: Self-pay | Admitting: *Deleted

## 2018-02-17 ENCOUNTER — Ambulatory Visit (HOSPITAL_COMMUNITY)
Admission: EM | Admit: 2018-02-17 | Discharge: 2018-02-17 | Disposition: A | Discharge status: Home / Self Care | Payer: Medicare Other | Attending: Internal Medicine | Admitting: Internal Medicine

## 2018-02-17 DIAGNOSIS — H6981 Other specified disorders of Eustachian tube, right ear: Secondary | ICD-10-CM

## 2018-02-17 MED ORDER — FLUTICASONE PROPIONATE 50 MCG/ACT NA SUSP: 1.0000 | Freq: Every day | NASAL | 2 refills | Status: DC

## 2018-02-17 MED ORDER — LIDOCAINE HCL 2 % IJ SOLN
INTRAMUSCULAR | Status: AC
  Filled 2018-02-17: qty 20

## 2018-02-17 NOTE — ED Triage Notes (Signed)
Right ear is stuffy, nasal stuffiness.  Right ear is painful, roaring.  Onset yesterday of symptoms

## 2018-02-17 NOTE — Telephone Encounter (Signed)
3rd attempt to call pt.  Unable to reach her; left voice message to call the office and ask for a Triage nurse, to discuss her symptoms.

## 2018-02-17 NOTE — ED Provider Notes (Signed)
[facility]    CSN: [id number] Arrival date & time: 02/17/18  1419     History   Chief Complaint Chief Complaint  Patient presents with  . Otalgia    HPI Megan Robbins is a 82 y.o. female.   Megan Robbins presents with complaints of pressures to right face and right ear. Started yesterday. Worse last night. Takes daily claritin daily. Has started using nasal saline which has not helped. States she feels like she is congested but can't get any drainage. No cough. No fevers. No sore throat. No ear drainage. Hx of anxiety, htn.    ROS per HPI.      Past Medical History:  Diagnosis Date  . Anxiety   . Hypertension     Patient Active Problem List   Diagnosis Date Noted  . Function kidney decreased 05/13/2017  . Compression fracture of L3 vertebra (HCC) 05/13/2017  . Lower back pain 05/08/2017  . Stress and adjustment reaction 05/08/2017  . Overweight 05/23/2015  . Uncontrolled hypertension 05/08/2015    Past Surgical History:  Procedure Laterality Date  . ABDOMINAL HYSTERECTOMY    . APPENDECTOMY    . FOOT SURGERY    . HEEL SPUR SURGERY    . TONSILLECTOMY      OB History   None      Home Medications    Prior to Admission medications   Medication Sig Start Date End Date Taking? Authorizing Provider  amLODipine (NORVASC) 5 MG tablet Take 1 tablet (5 mg total) by mouth daily. 01/18/18   Pincus SanesBurns, Stacy J, MD  atenolol (TENORMIN) 50 MG tablet Take 1 tablet (50 mg total) by mouth daily. -- Office visit needed for further refills 01/18/18   Pincus SanesBurns, Stacy J, MD  fluticasone [facility](FLONASE) 50 MCG/ACT nasal spray Place 1 spray into both nostrils daily. 02/17/18   Georgetta HaberBurky, Destenee Guerry B, NP  hydrALAZINE (APRESOLINE) 25 MG tablet Take 1 tablet (25 mg total) by mouth 3 (three) times daily. 01/18/18   Pincus SanesBurns, Stacy J, MD  ipratropium (ATROVENT) 0.03 % nasal spray USE 2 SPRAYS IN NOSTRILS 4 TIMES DAILY 04/11/15   [provider]  loratadine (CLARITIN) 10 MG tablet Take 10 mg  by mouth daily.    [provider]  NONFORMULARY OR COMPOUNDED ITEM Apply 1-2 g topically 4 (four) times daily. 04/28/16   Felecia ShellingEvans, Brent M, DPM  Olopatadine HCl (PAZEO) 0.7 % SOLN Apply 1 drop to eye daily. 12/27/16   [place], Betty G, MD  Vitamin D, Ergocalciferol, (DRISDOL) 50000 units CAPS capsule Take 1 capsule (50,000 Units total) by mouth every 7 (seven) days. 05/13/17   Judi SaaSmith, Zachary M, DO    Family History Family History  Problem Relation Age of Onset  . Hyperlipidemia Mother   . Hypertension Mother   . Diabetes Daughter   . Diabetes Son     Social History Social History   Tobacco Use  . Smoking status: Never Smoker  . Smokeless tobacco: Never Used  Substance Use Topics  . Alcohol use: No  . Drug use: No     Allergies   Patient has no known allergies.   Review of Systems Review of Systems   Physical Exam Triage Vital Signs ED Triage Vitals  Enc Vitals Group     BP 02/17/18 1438 (!) 156/76     Pulse Rate 02/17/18 1438 65     Resp 02/17/18 1438 18     Temp 02/17/18 1438 98 F (36.7 C)     Temp  src --      SpO2 02/17/18 1438 98 %     Weight --      Height --      Head Circumference --      Peak Flow --      Pain Score 02/17/18 1447 0     Pain Loc --      Pain Edu? --      Excl. in GC? --    No data found.  Updated Vital Signs BP (!) 156/76 (BP Location: Left Arm)   Pulse 65   Temp 98 F (36.7 C)   Resp 18   SpO2 98%    Physical Exam  Constitutional: She is oriented to person, place, and time. She appears well-developed and well-nourished. No distress.  HENT:  Head: Normocephalic and atraumatic.  Right Ear: External ear and ear canal normal. A middle ear effusion is present.  Left Ear: Tympanic membrane, external ear and ear canal normal.  Nose: Right sinus exhibits maxillary sinus tenderness.  Mouth/Throat: Uvula is midline, oropharynx is clear and moist and mucous membranes are normal. No tonsillar exudate.  Eyes: Pupils are equal,  round, and reactive to light. Conjunctivae and EOM are normal.  Cardiovascular: Normal rate, regular rhythm and normal heart sounds.  Pulmonary/Chest: Effort normal and breath sounds normal.  Neurological: She is alert and oriented to person, place, and time.  Skin: Skin is warm and dry.     UC Treatments / Results  Labs (all labs ordered are listed, but only abnormal results are displayed) Labs Reviewed - No data to display  EKG None  Radiology No results found.  Procedures Procedures (including critical care time)  Medications Ordered in UC Medications - No data to display  Initial Impression / Assessment and Plan / UC Course  I have reviewed the triage vital signs and the nursing notes.  Pertinent labs & imaging results that were available during my care of the patient were reviewed by me and considered in my medical decision making (see chart for details).     Benign physical exam. Non toxic in appearance. Daily flonase recommended. Continue to push fluids. May continue with claritin and nasal saline. If symptoms worsen or do not improve in the next week to return to be seen or to follow up with PCP.  Patient verbalized understanding and agreeable to plan.    Final Clinical Impressions(s) / UC Diagnoses   Final diagnoses:  Eustachian tube dysfunction, right     Discharge Instructions     Push fluids to ensure adequate hydration and keep secretions thin.  Tylenol as needed for pain control. May continue with the nasal spray you have already purchased, throughout the day to promote drainage. Start Flonase once a day.  If symptoms worsen or do not improve in the next week to return to be seen or to follow up with your PCP.     ED Prescriptions    Medication Sig Dispense Auth. Provider   fluticasone (FLONASE) 50 MCG/ACT nasal spray Place 1 spray into both nostrils daily. 16 g Georgetta Haber, NP     Controlled Substance Prescriptions [place] Controlled Substance  Registry consulted? Not Applicable   Georgetta Haber, NP 02/17/18 1523

## 2018-02-17 NOTE — Discharge Instructions (Signed)
Push fluids to ensure adequate hydration and keep secretions thin.  Tylenol as needed for pain control. May continue with the nasal spray you have already purchased, throughout the day to promote drainage. Start Flonase once a day.  If symptoms worsen or do not improve in the next week to return to be seen or to follow up with your PCP.

## 2018-02-17 NOTE — Telephone Encounter (Signed)
No answer, Left VM to call back and ask to speak with a nurse re her ears.

## 2018-02-18 ENCOUNTER — Ambulatory Visit: Payer: Medicare Other | Admitting: Family

## 2018-03-03 ENCOUNTER — Ambulatory Visit: Payer: Medicare Other | Admitting: Internal Medicine

## 2018-03-03 NOTE — Progress Notes (Unsigned)
Subjective:    Patient ID: Megan Robbins, female    DOB: 02/21/1936, 82 y.o.   MRN: [id number]  HPI The patient is here for follow up.  Hypertension: We adjusted her medication 1 month ago-she should be taking amlodipine 5 mg daily, atenolol 50 mg daily and hydralazine 25 mg 3 times daily.  She is taking her medication daily. She is compliant with a low sodium diet.  She denies chest pain, palpitations, edema, shortness of breath and regular headaches. She is not exercising regularly.  She does not monitor her blood pressure at home.    Decreased kidney function:  Compression fracture: 1 month ago we discussed her having a bone density scan, but she deferred.  She denies any back pain.  Medications and allergies reviewed with patient and updated if appropriate.  Patient Active Problem List   Diagnosis Date Noted  . Function kidney decreased 05/13/2017  . Compression fracture of L3 vertebra (HCC) 05/13/2017  . Lower back pain 05/08/2017  . Stress and adjustment reaction 05/08/2017  . Overweight 05/23/2015  . Uncontrolled hypertension 05/08/2015    Current Outpatient Medications on File Prior to Visit  Medication Sig Dispense Refill  . amLODipine (NORVASC) 5 MG tablet Take 1 tablet (5 mg total) by mouth daily. 30 tablet 5  . atenolol (TENORMIN) 50 MG tablet Take 1 tablet (50 mg total) by mouth daily. -- Office visit needed for further refills 30 tablet 5  . fluticasone (FLONASE) 50 MCG/ACT nasal spray Place 1 spray into both nostrils daily. 16 g 2  . hydrALAZINE (APRESOLINE) 25 MG tablet Take 1 tablet (25 mg total) by mouth 3 (three) times daily. 90 tablet 5  . ipratropium (ATROVENT) 0.03 % nasal spray USE 2 SPRAYS IN NOSTRILS 4 TIMES DAILY  0  . loratadine (CLARITIN) 10 MG tablet Take 10 mg by mouth daily.    . NONFORMULARY OR COMPOUNDED ITEM Apply 1-2 g topically 4 (four) times daily. 120 each 2  . Olopatadine HCl (PAZEO) 0.7 % SOLN Apply 1 drop to eye daily. 1 Bottle 2  .  Vitamin D, Ergocalciferol, (DRISDOL) 50000 units CAPS capsule Take 1 capsule (50,000 Units total) by mouth every 7 (seven) days. 12 capsule 0   No current facility-administered medications on file prior to visit.     Past Medical History:  Diagnosis Date  . Anxiety   . Hypertension     Past Surgical History:  Procedure Laterality Date  . ABDOMINAL HYSTERECTOMY    . APPENDECTOMY    . FOOT SURGERY    . HEEL SPUR SURGERY    . TONSILLECTOMY      Social History   Socioeconomic History  . Marital status: Married    Spouse name: Not on file  . Number of children: Not on file  . Years of education: Not on file  . Highest education level: Not on file  Occupational History  . Not on file  Social Needs  . Financial resource strain: Not on file  . Food insecurity:    Worry: Not on file    Inability: Not on file  . Transportation needs:    Medical: Not on file    Non-medical: Not on file  Tobacco Use  . Smoking status: Never Smoker  . Smokeless tobacco: Never Used  Substance and Sexual Activity  . Alcohol use: No  . Drug use: No  . Sexual activity: Yes    Birth control/protection: Post-menopausal  Lifestyle  . Physical activity:  Days per week: Not on file    Minutes per session: Not on file  . Stress: Not on file  Relationships  . Social connections:    Talks on phone: Not on file    Gets together: Not on file    Attends religious service: Not on file    Active member of club or organization: Not on file    Attends meetings of clubs or organizations: Not on file    Relationship status: Not on file  Other Topics Concern  . Not on file  Social History Narrative   No regular exercise    Family History  Problem Relation Age of Onset  . Hyperlipidemia Mother   . Hypertension Mother   . Diabetes Daughter   . Diabetes Son     Review of Systems     Objective:  There were no vitals filed for this visit. BP Readings from Last 3 Encounters:  02/17/18 (!)  156/76  01/18/18 (!) 200/100  05/13/17 (!) 142/82   Wt Readings from Last 3 Encounters:  01/18/18 180 lb (81.6 kg)  05/13/17 175 lb (79.4 kg)  05/08/17 175 lb (79.4 kg)   There is no height or weight on file to calculate BMI.   Physical Exam    Constitutional: Appears well-developed and well-nourished. No distress.  HENT:  Head: Normocephalic and atraumatic.  Neck: Neck supple. No tracheal deviation present. No thyromegaly present.  No cervical lymphadenopathy Cardiovascular: Normal rate, regular rhythm and normal heart sounds.   No murmur heard. No carotid bruit .  No edema Pulmonary/Chest: Effort normal and breath sounds normal. No respiratory distress. No has no wheezes. No rales.  Skin: Skin is warm and dry. Not diaphoretic.  Psychiatric: Normal mood and affect. Behavior is normal.      Assessment & Plan:    See Problem List for Assessment and Plan of chronic medical problems.

## 2018-03-09 NOTE — Progress Notes (Deleted)
Subjective:    Patient ID: Megan Robbins, female    DOB: 1936/06/22, 82 y.o.   MRN: [id number]  HPI The patient is here for follow up.  Hypertension: her medications were adjusted in June.  She is taking her medication daily. She is compliant with a low sodium diet.  She denies chest pain, palpitations, edema, shortness of breath and regular headaches. She is exercising regularly.  She does not monitor her blood pressure at home.    CKD:    Medications and allergies reviewed with patient and updated if appropriate.  Patient Active Problem List   Diagnosis Date Noted  . Function kidney decreased 05/13/2017  . Compression fracture of L3 vertebra (HCC) 05/13/2017  . Lower back pain 05/08/2017  . Stress and adjustment reaction 05/08/2017  . Overweight 05/23/2015  . Uncontrolled hypertension 05/08/2015    Current Outpatient Medications on File Prior to Visit  Medication Sig Dispense Refill  . amLODipine (NORVASC) 5 MG tablet Take 1 tablet (5 mg total) by mouth daily. 30 tablet 5  . atenolol (TENORMIN) 50 MG tablet Take 1 tablet (50 mg total) by mouth daily. -- Office visit needed for further refills 30 tablet 5  . fluticasone (FLONASE) 50 MCG/ACT nasal spray Place 1 spray into both nostrils daily. 16 g 2  . hydrALAZINE (APRESOLINE) 25 MG tablet Take 1 tablet (25 mg total) by mouth 3 (three) times daily. 90 tablet 5  . ipratropium (ATROVENT) 0.03 % nasal spray USE 2 SPRAYS IN NOSTRILS 4 TIMES DAILY  0  . loratadine (CLARITIN) 10 MG tablet Take 10 mg by mouth daily.    . NONFORMULARY OR COMPOUNDED ITEM Apply 1-2 g topically 4 (four) times daily. 120 each 2  . Olopatadine HCl (PAZEO) 0.7 % SOLN Apply 1 drop to eye daily. 1 Bottle 2  . Vitamin D, Ergocalciferol, (DRISDOL) 50000 units CAPS capsule Take 1 capsule (50,000 Units total) by mouth every 7 (seven) days. 12 capsule 0   No current facility-administered medications on file prior to visit.     Past Medical History:    Diagnosis Date  . Anxiety   . Hypertension     Past Surgical History:  Procedure Laterality Date  . ABDOMINAL HYSTERECTOMY    . APPENDECTOMY    . FOOT SURGERY    . HEEL SPUR SURGERY    . TONSILLECTOMY      Social History   Socioeconomic History  . Marital status: Married    Spouse name: Not on file  . Number of children: Not on file  . Years of education: Not on file  . Highest education level: Not on file  Occupational History  . Not on file  Social Needs  . Financial resource strain: Not on file  . Food insecurity:    Worry: Not on file    Inability: Not on file  . Transportation needs:    Medical: Not on file    Non-medical: Not on file  Tobacco Use  . Smoking status: Never Smoker  . Smokeless tobacco: Never Used  Substance and Sexual Activity  . Alcohol use: No  . Drug use: No  . Sexual activity: Yes    Birth control/protection: Post-menopausal  Lifestyle  . Physical activity:    Days per week: Not on file    Minutes per session: Not on file  . Stress: Not on file  Relationships  . Social connections:    Talks on phone: Not on file    Gets  together: Not on file    Attends religious service: Not on file    Active member of club or organization: Not on file    Attends meetings of clubs or organizations: Not on file    Relationship status: Not on file  Other Topics Concern  . Not on file  Social History Narrative   No regular exercise    Family History  Problem Relation Age of Onset  . Hyperlipidemia Mother   . Hypertension Mother   . Diabetes Daughter   . Diabetes Son     Review of Systems     Objective:  There were no vitals filed for this visit. BP Readings from Last 3 Encounters:  02/17/18 (!) 156/76  01/18/18 (!) 200/100  05/13/17 (!) 142/82   Wt Readings from Last 3 Encounters:  01/18/18 180 lb (81.6 kg)  05/13/17 175 lb (79.4 kg)  05/08/17 175 lb (79.4 kg)   There is no height or weight on file to calculate BMI.   Physical  Exam    Constitutional: Appears well-developed and well-nourished. No distress.  HENT:  Head: Normocephalic and atraumatic.  Neck: Neck supple. No tracheal deviation present. No thyromegaly present.  No cervical lymphadenopathy Cardiovascular: Normal rate, regular rhythm and normal heart sounds.   No murmur heard. No carotid bruit .  No edema Pulmonary/Chest: Effort normal and breath sounds normal. No respiratory distress. No has no wheezes. No rales.  Skin: Skin is warm and dry. Not diaphoretic.  Psychiatric: Normal mood and affect. Behavior is normal.      Assessment & Plan:    See Problem List for Assessment and Plan of chronic medical problems.

## 2018-03-10 ENCOUNTER — Ambulatory Visit: Payer: Medicare Other | Admitting: Internal Medicine

## 2018-03-15 ENCOUNTER — Ambulatory Visit: Payer: Medicare Other | Admitting: Podiatry

## 2018-03-15 DIAGNOSIS — M722 Plantar fascial fibromatosis: Secondary | ICD-10-CM

## 2018-03-16 NOTE — Progress Notes (Signed)
Subjective:    Patient ID: Megan Robbins, female    DOB: 1935-12-15, 82 y.o.   MRN: [id number]  HPI The patient is here for follow up.  Hypertension: her medications were adjusted in June.  She is taking her medication daily. She is compliant with a low sodium diet.  She denies chest pain, palpitations, edema, shortness of breath and regular headaches. She is not exercising regularly.  She does monitor her blood pressure at home  -120's/ 60-70's.  She states it is always high here - she gets very nervous we are going to tell her bad news.  Overall she feels great and has no complaints.  She feels her blood pressure is well controlled at this point..    CKD:  She is increasing her water intake.  She does not take any nsaids.  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  History of lumbar compression fracture: This occurred after a fall.  She was experiencing back pain, but states this pain has resolved.  She is not exercising regularly.  She is never had a bone density scan.  Medications and allergies reviewed with patient and updated if appropriate.  Patient Active Problem List   Diagnosis Date Noted  . Function kidney decreased 05/13/2017  . Compression fracture of L3 vertebra (HCC) 05/13/2017  . Lower back pain 05/08/2017  . Stress and adjustment reaction 05/08/2017  . Overweight 05/23/2015  . Uncontrolled hypertension 05/08/2015    Current Outpatient Medications on File Prior to Visit  Medication Sig Dispense Refill  . amLODipine (NORVASC) 5 MG tablet Take 1 tablet (5 mg total) by mouth daily. 30 tablet 5  . atenolol (TENORMIN) 50 MG tablet Take 1 tablet (50 mg total) by mouth daily. -- Office visit needed for further refills 30 tablet 5  . fluticasone (FLONASE) 50 MCG/ACT nasal spray Place 1 spray into both nostrils daily. 16 g 2  . hydrALAZINE (APRESOLINE) 25 MG tablet Take 1 tablet (25  mg total) by mouth 3 (three) times daily. 90 tablet 5  . ipratropium (ATROVENT) 0.03 % nasal spray USE 2 SPRAYS IN NOSTRILS 4 TIMES DAILY  0  . loratadine (CLARITIN) 10 MG tablet Take 10 mg by mouth daily.    . NONFORMULARY OR COMPOUNDED ITEM Apply 1-2 g topically 4 (four) times daily. 120 each 2  . Olopatadine HCl (PAZEO) 0.7 % SOLN Apply 1 drop to eye daily. 1 Bottle 2  . Vitamin D, Ergocalciferol, (DRISDOL) 50000 units CAPS capsule Take 1 capsule (50,000 Units total) by mouth every 7 (seven) days. 12 capsule 0   No current facility-administered medications on file prior to visit.     Past Medical History:  Diagnosis Date  . Anxiety   . Hypertension     Past Surgical History:  Procedure Laterality Date  . ABDOMINAL HYSTERECTOMY    . APPENDECTOMY    . FOOT SURGERY    . HEEL SPUR SURGERY    . TONSILLECTOMY      Social History   Socioeconomic History  . Marital status: Married    Spouse name: Not on file  . Number of children: Not on file  . Years of education: Not on file  . Highest education level: Not on file  Occupational History  . Not on file  Social Needs  . Financial resource strain: Not on file  . Food insecurity:  Worry: Not on file    Inability: Not on file  . Transportation needs:    Medical: Not on file    Non-medical: Not on file  Tobacco Use  . Smoking status: Never Smoker  . Smokeless tobacco: Never Used  Substance and Sexual Activity  . Alcohol use: No  . Drug use: No  . Sexual activity: Yes    Birth control/protection: Post-menopausal  Lifestyle  . Physical activity:    Days per week: Not on file    Minutes per session: Not on file  . Stress: Not on file  Relationships  . Social connections:    Talks on phone: Not on file    Gets together: Not on file    Attends religious service: Not on file    Active member of club or organization: Not on file    Attends meetings of clubs or organizations: Not on file    Relationship status: Not on  file  Other Topics Concern  . Not on file  Social History Narrative   No regular exercise    Family History  Problem Relation Age of Onset  . Hyperlipidemia Mother   . Hypertension Mother   . Diabetes Daughter   . Diabetes Son     Review of Systems  Constitutional: Negative for fever.  Respiratory: Negative for cough, shortness of breath and wheezing.   Cardiovascular: Negative for chest pain, palpitations and leg swelling.  Neurological: Negative for light-headedness and headaches.       Objective:   Vitals:   03/17/18 1502  BP: (!) 174/88  Pulse: 72  Resp: 16  Temp: 98.2 F (36.8 C)  SpO2: 97%   BP Readings from Last 3 Encounters:  03/17/18 (!) 174/88  02/17/18 (!) 156/76  01/18/18 (!) 200/100   Wt Readings from Last 3 Encounters:  03/17/18 181 lb (82.1 kg)  01/18/18 180 lb (81.6 kg)  05/13/17 175 lb (79.4 kg)   Body mass index is 32.06 kg/m.   Physical Exam    Constitutional: Appears well-developed and well-nourished. No distress.  HENT:  Head: Normocephalic and atraumatic.  Neck: Neck supple. No tracheal deviation present. No thyromegaly present.  No cervical lymphadenopathy Cardiovascular: Normal rate, regular rhythm and normal heart sounds.   2/6 systolic  murmur heard. No carotid bruit .  No edema Pulmonary/Chest: Effort normal and breath sounds normal. No respiratory distress. No has no wheezes. No rales.  Skin: Skin is warm and dry. Not diaphoretic.  Psychiatric: Normal mood and affect. Behavior is normal.      Assessment & Plan:    See Problem List for Assessment and Plan of chronic medical problems.

## 2018-03-17 ENCOUNTER — Encounter: Payer: Self-pay | Admitting: Internal Medicine

## 2018-03-17 ENCOUNTER — Ambulatory Visit (INDEPENDENT_AMBULATORY_CARE_PROVIDER_SITE_OTHER): Payer: Medicare Other | Admitting: Internal Medicine

## 2018-03-17 VITALS — BP 174/88 | HR 72 | Temp 98.2°F | Resp 16 | Wt 181.0 lb

## 2018-03-17 DIAGNOSIS — E663 Overweight: Secondary | ICD-10-CM | POA: Diagnosis not present

## 2018-03-17 DIAGNOSIS — F4329 Adjustment disorder with other symptoms: Secondary | ICD-10-CM

## 2018-03-17 DIAGNOSIS — S32030D Wedge compression fracture of third lumbar vertebra, subsequent encounter for fracture with routine healing: Secondary | ICD-10-CM | POA: Diagnosis not present

## 2018-03-17 DIAGNOSIS — N289 Disorder of kidney and ureter, unspecified: Secondary | ICD-10-CM | POA: Diagnosis not present

## 2018-03-17 DIAGNOSIS — I10 Essential (primary) hypertension: Secondary | ICD-10-CM | POA: Diagnosis not present

## 2018-03-17 MED ORDER — ATENOLOL 50 MG PO TABS: 50.0000 mg | ORAL_TABLET | Freq: Every day | ORAL | 5 refills | Status: DC

## 2018-03-17 NOTE — Patient Instructions (Addendum)
Have blood work done.   Test(s) ordered today. Your results will be released to MyChart (or called to you) after review, usually within 72hours after test completion. If any changes need to be made, you will be notified at that same time.  Continue to drink a lot of water.  Avoid all advil and aleve.   Medications reviewed and updated.  No changes recommended at this time.  Monitor your BP at home - it should be less than 140/90.  Bring your BP cuff with you to your next appointment.      Please followup in 6 months

## 2018-03-17 NOTE — Assessment & Plan Note (Signed)
Check TSH, A1c Encourage healthy diet and regular exercise

## 2018-03-17 NOTE — Assessment & Plan Note (Signed)
We only have one CMP on file, but likely chronic kidney disease secondary to uncontrolled hypertension Repeat CMP, CBC-she states she will come back for blood work She has increased her water intake Does not take any NSAIDs Stress the importance of maintaining good blood pressure control Check A1c to rule out diabetes as well

## 2018-03-17 NOTE — Assessment & Plan Note (Signed)
Taking care of her sister who has dementia and who is not always nice to her-this is 1 of her biggest stresses currently Has not wanted medication She understands this may also be increasing her blood pressure and she will monitor this at home

## 2018-03-17 NOTE — Assessment & Plan Note (Addendum)
Denies back pain Deferred dexa Encourage more regular exercise

## 2018-03-17 NOTE — Assessment & Plan Note (Signed)
Blood pressure elevated here today, but she states when she checks it at home it is very well controlled-?  Controlled blood pressure She may have an element of [place] hypertension Advised her to continue to monitor her blood pressure and bring her cuff in to her next appointment No change in current medications CMP, TSH, CBC, lipid panel

## 2018-03-18 NOTE — Progress Notes (Signed)
   Subjective: 82 year old female presenting today for follow up evaluation of bilateral heel pain secondary to plantar fasciitis. She states the injections help alleviate the pain. There are no modifying factors noted. She denies any new complaints at this time. Patient is here for further evaluation and treatment.   Past Medical History:  Diagnosis Date  . Anxiety   . Hypertension      Objective: Physical Exam General: The patient is alert and oriented x3 in no acute distress.  Dermatology: Skin is warm, dry and supple bilateral lower extremities. Negative for open lesions or macerations bilateral.   Vascular: Dorsalis Pedis and Posterior Tibial pulses palpable bilateral.  Capillary fill time is immediate to all digits.  Neurological: Epicritic and protective threshold intact bilateral.   Musculoskeletal: Tenderness to palpation to the plantar aspect of the bilateral heels along the plantar fascia. All other joints range of motion within normal limits bilateral. Strength 5/5 in all groups bilateral.   Assessment: 1. plantar fasciitis bilateral feet - chronic  Plan of Care:  1. Patient evaluated.  2. Patient not currently ready for surgery.  3. Injection of 0.5 mLs Celestone Soluspan injected into the bilateral heels.  4. Continue taking Meloxicam daily.  5. Return to clinic as needed.     Felecia ShellingBrent M. Tyronica Truxillo, DPM [facility]  Dr. Felecia ShellingBrent M. Maanav Kassabian, DPM    [address]                                   [place], [place] [address]                Office [phone]  Fax [phone]

## 2018-03-29 ENCOUNTER — Ambulatory Visit (HOSPITAL_COMMUNITY)
Admission: EM | Admit: 2018-03-29 | Discharge: 2018-03-29 | Disposition: A | Discharge status: Home / Self Care | Payer: Medicare Other | Attending: Family Medicine | Admitting: Family Medicine

## 2018-03-29 ENCOUNTER — Encounter (HOSPITAL_COMMUNITY): Payer: Self-pay | Admitting: Physician Assistant

## 2018-03-29 DIAGNOSIS — H9201 Otalgia, right ear: Secondary | ICD-10-CM

## 2018-03-29 DIAGNOSIS — H1012 Acute atopic conjunctivitis, left eye: Secondary | ICD-10-CM

## 2018-03-29 DIAGNOSIS — H6981 Other specified disorders of Eustachian tube, right ear: Secondary | ICD-10-CM

## 2018-03-29 MED ORDER — IPRATROPIUM BROMIDE 0.06 % NA SOLN: 2.0000 | Freq: Four times a day (QID) | NASAL | 12 refills | Status: DC

## 2018-03-29 MED ORDER — TRIAMCINOLONE ACETONIDE 55 MCG/ACT NA AERO: 2.0000 | INHALATION_SPRAY | Freq: Every day | NASAL | 12 refills | Status: DC

## 2018-03-29 MED ORDER — OLOPATADINE HCL 0.7 % OP SOLN: 1.0000 [drp] | Freq: Every day | OPHTHALMIC | 0 refills | Status: DC

## 2018-03-29 NOTE — Discharge Instructions (Signed)
No signs of ear infection or ear wax that is causing the symptoms. Continue claritin and flonase. Start atrovent as directed. As discussed, if you'd like to try, you can discontinue flonase and switch to nasacort. Nasacort is also over the counter, you can check and see if it is cheaper over the counter. I have also refilled olopatadine eye drops, this can help with itchy eyes from allergies. Follow up with allergy specialist as scheduled for further evaluation and management needed.

## 2018-03-29 NOTE — ED Provider Notes (Signed)
[facility]    CSN: [id number] Arrival date & time: 03/29/18  1423     History   Chief Complaint No chief complaint on file.   HPI Megan Robbins is a 82 y.o. female.   82 year old female comes in for 1 to 2-week history of right ear pain, fullness.  States this has been intermittent, also with intermittent tenderness.  She has had similar symptoms in the past, was treated for eustachian tube dysfunction, with Flonase, Claritin that resolved symptoms.  She has restarted using Flonase and Claritin for the past few days without relief.  She also has rhinorrhea, nasal congestion.  Itchy or watery eyes.  Denies fever, chills, night sweats.  Denies cough.  Denies recent swimming, traveling.     Past Medical History:  Diagnosis Date  . Anxiety   . Hypertension     Patient Active Problem List   Diagnosis Date Noted  . Function kidney decreased 05/13/2017  . Compression fracture of L3 vertebra (HCC) 05/13/2017  . Stress and adjustment reaction 05/08/2017  . Overweight 05/23/2015  . Uncontrolled hypertension 05/08/2015    Past Surgical History:  Procedure Laterality Date  . ABDOMINAL HYSTERECTOMY    . APPENDECTOMY    . FOOT SURGERY    . HEEL SPUR SURGERY    . TONSILLECTOMY      OB History   None      Home Medications    Prior to Admission medications   Medication Sig Start Date End Date Taking? Authorizing Provider  amLODipine (NORVASC) 5 MG tablet Take 1 tablet (5 mg total) by mouth daily. 01/18/18   Pincus Sanes, MD  atenolol (TENORMIN) 50 MG tablet Take 1 tablet (50 mg total) by mouth daily. 03/17/18   Pincus Sanes, MD  fluticasone (FLONASE) 50 MCG/ACT nasal spray Place 1 spray into both nostrils daily. 02/17/18   Georgetta Haber, NP  hydrALAZINE (APRESOLINE) 25 MG tablet Take 1 tablet (25 mg total) by mouth 3 (three) times daily. 01/18/18   Pincus Sanes, MD  ipratropium (ATROVENT) 0.06 % nasal spray Place 2 sprays into both nostrils 4 (four)  times daily. 03/29/18   Cathie Hoops, Megan V, PA-C  loratadine (CLARITIN) 10 MG tablet Take 10 mg by mouth daily.    [provider]  NONFORMULARY OR COMPOUNDED ITEM Apply 1-2 g topically 4 (four) times daily. 04/28/16   Felecia Shelling, DPM  Olopatadine HCl (PAZEO) 0.7 % SOLN Apply 1 drop to eye daily. 03/29/18   Cathie Hoops, Megan V, PA-C  triamcinolone (NASACORT) 55 MCG/ACT AERO nasal inhaler Place 2 sprays into the nose daily. 03/29/18   Cathie Hoops, Megan V, PA-C  Vitamin D, Ergocalciferol, (DRISDOL) 50000 units CAPS capsule Take 1 capsule (50,000 Units total) by mouth every 7 (seven) days. 05/13/17   Judi Saa, DO    Family History Family History  Problem Relation Age of Onset  . Hyperlipidemia Mother   . Hypertension Mother   . Diabetes Daughter   . Diabetes Son     Social History Social History   Tobacco Use  . Smoking status: Never Smoker  . Smokeless tobacco: Never Used  Substance Use Topics  . Alcohol use: No  . Drug use: No     Allergies   Patient has no known allergies.   Review of Systems Review of Systems  Reason unable to perform ROS: See HPI as above.     Physical Exam Triage Vital Signs ED Triage Vitals  Enc Vitals Group  BP 03/29/18 1512 (!) 168/80     Pulse Rate 03/29/18 1512 72     Resp 03/29/18 1512 18     Temp 03/29/18 1512 98.2 F (36.8 C)     Temp Source 03/29/18 1512 Oral     SpO2 03/29/18 1512 100 %     Weight --      Height --      Head Circumference --      Peak Flow --      Pain Score 03/29/18 1537 0     Pain Loc --      Pain Edu? --      Excl. in GC? --    No data found.  Updated Vital Signs BP (!) 168/80 (BP Location: Left Arm)   Pulse 72   Temp 98.2 F (36.8 C) (Oral)   Resp 18   SpO2 100%   Physical Exam  Constitutional: She is oriented to person, place, and time. She appears well-developed and well-nourished. No distress.  HENT:  Head: Normocephalic and atraumatic.  Right Ear: Tympanic membrane, external ear and ear canal  normal. Tympanic membrane is not erythematous and not bulging.  Left Ear: Tympanic membrane, external ear and ear canal normal. Tympanic membrane is not erythematous and not bulging.  Nose: Nose normal. Right sinus exhibits no maxillary sinus tenderness and no frontal sinus tenderness. Left sinus exhibits no maxillary sinus tenderness and no frontal sinus tenderness.  Mouth/Throat: Uvula is midline, oropharynx is clear and moist and mucous membranes are normal.  No tenderness to palpation of bilateral tragus.  Eyes: Pupils are equal, round, and reactive to light. Conjunctivae are normal.  Neck: Normal range of motion. Neck supple.  Cardiovascular: Normal rate and regular rhythm. Exam reveals no gallop and no friction rub.  Murmur heard. Pulmonary/Chest: Effort normal and breath sounds normal. She has no decreased breath sounds. She has no wheezes. She has no rhonchi. She has no rales.  Lymphadenopathy:    She has no cervical adenopathy.  Neurological: She is alert and oriented to person, place, and time.  Skin: Skin is warm and dry.  Psychiatric: She has a normal mood and affect. Her behavior is normal. Judgment normal.   UC Treatments / Results  Labs (all labs ordered are listed, but only abnormal results are displayed) Labs Reviewed - No data to display  EKG None  Radiology No results found.  Procedures Procedures (including critical care time)  Medications Ordered in UC Medications - No data to display  Initial Impression / Assessment and Plan / UC Course  I have reviewed the triage vital signs and the nursing notes.  Pertinent labs & imaging results that were available during my care of the patient were reviewed by me and considered in my medical decision making (see chart for details).    No signs of otitis media/externa.  No cerumen impaction.  Discussed with patient symptoms could be caused by eustachian tube dysfunction, allergic rhinitis.  Will have patient continue  Flonase, Claritin.  Start Atrovent as directed.  Patient can discontinue Flonase and start trial of Nasacort if symptoms not improving.  Refilled Pataday for allergic conjunctivitis.  Patient to follow-up as scheduled for further evaluation with allergy specialist.  Return precautions given.  Patient expresses understanding and agrees to plan.  Final Clinical Impressions(s) / UC Diagnoses   Final diagnoses:  Right ear pain  Dysfunction of right eustachian tube    ED Prescriptions    Medication Sig Dispense Auth. Provider  Olopatadine HCl (PAZEO) 0.7 % SOLN Apply 1 drop to eye daily. 1 Bottle Megan Robbins, Megan V, PA-C   ipratropium (ATROVENT) 0.06 % nasal spray Place 2 sprays into both nostrils 4 (four) times daily. 15 mL Megan Robbins, Megan V, PA-C   triamcinolone (NASACORT) 55 MCG/ACT AERO nasal inhaler Place 2 sprays into the nose daily. 1 Inhaler Threasa Alpha, [place] 03/29/18 1540

## 2018-04-01 DIAGNOSIS — H6991 Unspecified Eustachian tube disorder, right ear: Secondary | ICD-10-CM | POA: Insufficient documentation

## 2018-04-01 DIAGNOSIS — J302 Other seasonal allergic rhinitis: Secondary | ICD-10-CM

## 2018-04-01 DIAGNOSIS — H6122 Impacted cerumen, left ear: Secondary | ICD-10-CM

## 2018-04-01 DIAGNOSIS — H6981 Other specified disorders of Eustachian tube, right ear: Secondary | ICD-10-CM | POA: Diagnosis not present

## 2018-04-01 HISTORY — DX: Impacted cerumen, left ear: H61.22

## 2018-04-01 HISTORY — DX: Unspecified eustachian tube disorder, right ear: H69.91

## 2018-04-01 HISTORY — DX: Other seasonal allergic rhinitis: J30.2

## 2018-04-08 ENCOUNTER — Other Ambulatory Visit: Payer: Self-pay

## 2018-04-08 DIAGNOSIS — I10 Essential (primary) hypertension: Secondary | ICD-10-CM

## 2018-04-08 MED ORDER — ATENOLOL 50 MG PO TABS: 50.0000 mg | ORAL_TABLET | Freq: Every day | ORAL | 5 refills | Status: DC

## 2018-04-14 ENCOUNTER — Ambulatory Visit (INDEPENDENT_AMBULATORY_CARE_PROVIDER_SITE_OTHER): Payer: Medicare Other | Admitting: Allergy and Immunology

## 2018-04-14 ENCOUNTER — Encounter: Payer: Self-pay | Admitting: Allergy and Immunology

## 2018-04-14 VITALS — BP 152/84 | HR 62 | Resp 16 | Ht 63.0 in | Wt 177.0 lb

## 2018-04-14 DIAGNOSIS — J3089 Other allergic rhinitis: Secondary | ICD-10-CM | POA: Diagnosis not present

## 2018-04-14 DIAGNOSIS — H1045 Other chronic allergic conjunctivitis: Secondary | ICD-10-CM

## 2018-04-14 DIAGNOSIS — H1013 Acute atopic conjunctivitis, bilateral: Secondary | ICD-10-CM

## 2018-04-14 MED ORDER — METHYLPREDNISOLONE ACETATE 80 MG/ML IJ SUSP
80.0000 mg | Freq: Once | INTRAMUSCULAR | Status: AC
  Administered 2018-04-14: 80 mg via INTRAMUSCULAR

## 2018-04-14 MED ORDER — MONTELUKAST SODIUM 10 MG PO TABS: 10.0000 mg | ORAL_TABLET | Freq: Every day | ORAL | 5 refills | Status: DC

## 2018-04-14 MED ORDER — FLUTICASONE PROPIONATE 50 MCG/ACT NA SUSP: 1.0000 | Freq: Every day | NASAL | 2 refills | Status: AC

## 2018-04-14 MED ORDER — OLOPATADINE HCL 0.7 % OP SOLN: 1.0000 [drp] | Freq: Every day | OPHTHALMIC | 0 refills | Status: DC

## 2018-04-14 NOTE — Progress Notes (Signed)
Dear Aquilla Hacker,  Thank you for referring Megan Robbins to the [facility] [place] on 04/14/2018.   Below is a summation of this patient's evaluation and recommendations.  Thank you for your referral. I will keep you informed about this patient's response to treatment.   If you have any questions please do not hesitate to contact me.   Sincerely,  Jessica Priest, MD Allergy / Immunology [facility]   ______________________________________________________________________    NEW PATIENT NOTE  Referring Provider: Aquilla Hacker, PA-C Primary Provider: Pincus Sanes, MD Date of office visit: 04/14/2018    Subjective:   Chief Complaint:  Megan Robbins (DOB: 1936-06-07) is a 82 y.o. female who presents to the clinic on 04/14/2018 with a chief complaint of Allergic Rhinitis  .     HPI: Tanairi presents to this clinic in evaluation of nasal and ear issues.  Apparently sometime in August she developed a problem with ringing in her right ear and echo hearing in her right ear along with nasal congestion and sneezing and rubbing of her nose and some itchy eyes.  Although her ear issue is relatively new she has had a long history of recurrent problems with her nose and eyes especially at nighttime.  She did not have any associated headaches or vertigo or hearing loss or anosmia or ugly nasal discharge and there was no obvious provoking factor giving rise to this issue.  She did end up in the urgent care center and it sounds as though she was given a nasal steroid and over the course of the past month or so her nasal steroid has helped her to some degree.  She did visit with ENT and there was documentation of normal ear function and what sounds like normal eustachian tube function.  Past Medical History:  Diagnosis Date  . Anxiety   . Hypertension     Past Surgical History:  Procedure  Laterality Date  . ABDOMINAL HYSTERECTOMY    . APPENDECTOMY    . FOOT SURGERY    . HEEL SPUR SURGERY    . TONSILLECTOMY      Allergies as of 04/14/2018   No Known Allergies     Medication List      amLODipine 5 MG tablet Commonly known as:  NORVASC Take 1 tablet (5 mg total) by mouth daily.   atenolol 50 MG tablet Commonly known as:  TENORMIN Take 1 tablet (50 mg total) by mouth daily.   fluticasone 50 MCG/ACT nasal spray Commonly known as:  FLONASE Place 1 spray into both nostrils daily.   hydrALAZINE 25 MG tablet Commonly known as:  APRESOLINE Take 1 tablet (25 mg total) by mouth 3 (three) times daily.   Olopatadine HCl 0.7 % Soln Apply 1 drop to eye daily.       Review of systems negative except as noted in HPI / PMHx or noted below:  Review of Systems  Constitutional: Negative.   HENT: Negative.   Eyes: Negative.   Respiratory: Negative.   Cardiovascular: Negative.   Gastrointestinal: Negative.   Genitourinary: Negative.   Musculoskeletal: Negative.   Skin: Negative.   Neurological: Negative.   Endo/Heme/Allergies: Negative.   Psychiatric/Behavioral: Negative.     Family History  Problem Relation Age of Onset  . Hyperlipidemia Mother   . Hypertension Mother   . Diabetes Daughter   . Diabetes Son     Social  History   Socioeconomic History  . Marital status: Married    Spouse name: Not on file  . Number of children: Not on file  . Years of education: Not on file  . Highest education level: Not on file  Occupational History  . Not on file  Social Needs  . Financial resource strain: Not on file  . Food insecurity:    Worry: Not on file    Inability: Not on file  . Transportation needs:    Medical: Not on file    Non-medical: Not on file  Tobacco Use  . Smoking status: Never Smoker  . Smokeless tobacco: Never Used  Substance and Sexual Activity  . Alcohol use: No  . Drug use: No  . Sexual activity: Yes    Birth control/protection:  Post-menopausal  Lifestyle  . Physical activity:    Days per week: Not on file    Minutes per session: Not on file  . Stress: Not on file  Relationships  . Social connections:    Talks on phone: Not on file    Gets together: Not on file    Attends religious service: Not on file    Active member of club or organization: Not on file    Attends meetings of clubs or organizations: Not on file    Relationship status: Not on file  . Intimate partner violence:    Fear of current or ex partner: Not on file    Emotionally abused: Not on file    Physically abused: Not on file    Forced sexual activity: Not on file  Other Topics Concern  . Not on file  Social History Narrative   No regular exercise    Environmental and Social history  Lives in a house with a dry environment, no animals located inside the household, carpet in the bedroom, no plastic on the bed, no plastic on the pillow, no smokers located inside the household  Objective:   Vitals:   04/14/18 0844  BP: (!) 152/84  Pulse: 62  Resp: 16  SpO2: 98%   Height: 5\' 3"  (160 cm) Weight: 177 lb (80.3 kg)  Physical Exam  HENT:  Head: Normocephalic. Head is without right periorbital erythema and without left periorbital erythema.  Right Ear: Tympanic membrane, external ear and ear canal normal.  Left Ear: Tympanic membrane, external ear and ear canal normal.  Nose: Nose normal. No mucosal edema or rhinorrhea.  Mouth/Throat: Uvula is midline, oropharynx is clear and moist and mucous membranes are normal. No oropharyngeal exudate.  Eyes: Pupils are equal, round, and reactive to light. Conjunctivae and lids are normal.  Neck: Trachea normal. No tracheal tenderness present. No tracheal deviation present. No thyromegaly present.  Cardiovascular: Normal rate, regular rhythm, S1 normal, S2 normal and normal heart sounds.  No murmur heard. Pulmonary/Chest: Effort normal and breath sounds normal. No stridor. No respiratory distress.  She has no wheezes. She has no rales. She exhibits no tenderness.  Abdominal: Soft. She exhibits no distension and no mass. There is no hepatosplenomegaly. There is no tenderness. There is no rebound and no guarding.  Musculoskeletal: She exhibits no edema or tenderness.  Lymphadenopathy:       Head (right side): No tonsillar adenopathy present.       Head (left side): No tonsillar adenopathy present.    She has no cervical adenopathy.    She has no axillary adenopathy.  Neurological: She is alert.  Skin: No rash noted. She is not  diaphoretic. No erythema. No pallor. Nails show no clubbing.    Diagnostics: Allergy skin tests were performed.  She demonstrated hypersensitivity to house dust mite.  Assessment and Plan:    1. Perennial allergic rhinitis   2. Perennial allergic conjunctivitis of both eyes     1.  Allergen avoidance measures  2.  Treat and prevent inflammation:   A.  Flonase 1 spray each nostril 1 time per day  B.  Montelukast 10 mg tablet 1 time per day  D.  Depo-Medrol 80 IM delivered in clinic today  3.  If needed:   A.  Loratadine 10 mg tablet 1 time per day  B.  Olopatadine 0.7% 1 drop each eye 1 time per day  4.  Return to clinic in 4 weeks or earlier if problem  5.  Obtain fall flu vaccine  Hopefully with a combination of allergen avoidance measures directed against house dust mite and the use of anti-inflammatory agents for her airway as noted above Seylah will resolve all the issues revolving around her allergic disease.  I will regroup with her in 4 weeks to assess her response to this approach.  Jessica Priest, MD Allergy / Immunology [facility] [place]

## 2018-04-14 NOTE — Patient Instructions (Addendum)
  1.  Allergen avoidance measures  2.  Treat and prevent inflammation:   A.  Flonase 1 spray each nostril 1 time per day  B.  Montelukast 10 mg tablet 1 time per day  D.  Depo-Medrol 80 IM delivered in clinic today  3.  If needed:   A.  Loratadine 10 mg tablet 1 time per day  B.  Olopatadine 0.7% 1 drop each eye 1 time per day  4.  Return to clinic in 4 weeks or earlier if problem  5.  Obtain fall flu vaccine

## 2018-04-15 ENCOUNTER — Encounter: Payer: Self-pay | Admitting: Allergy and Immunology

## 2018-05-18 ENCOUNTER — Ambulatory Visit: Payer: Medicare Other | Admitting: Allergy and Immunology

## 2018-05-18 ENCOUNTER — Encounter: Payer: Self-pay | Admitting: Allergy and Immunology

## 2018-05-18 VITALS — BP 138/82 | HR 64 | Temp 98.4°F | Resp 20 | Ht 63.2 in | Wt 177.8 lb

## 2018-05-18 DIAGNOSIS — J3089 Other allergic rhinitis: Secondary | ICD-10-CM | POA: Diagnosis not present

## 2018-05-18 DIAGNOSIS — H6981 Other specified disorders of Eustachian tube, right ear: Secondary | ICD-10-CM | POA: Diagnosis not present

## 2018-05-18 DIAGNOSIS — H1045 Other chronic allergic conjunctivitis: Secondary | ICD-10-CM

## 2018-05-18 DIAGNOSIS — H1013 Acute atopic conjunctivitis, bilateral: Secondary | ICD-10-CM

## 2018-05-18 NOTE — Progress Notes (Signed)
Follow-up Note  Referring Provider: Pincus Sanes, MD Primary Provider: Pincus Sanes, MD Date of Office Visit: 05/18/2018  Subjective:   Megan Robbins (DOB: 31-Oct-1935) is a 82 y.o. female who [facility] 05[facility]18/2018 in re-evaluation of the following:  HPI: Megan Robbins presents to this clinic in reevaluation of allergic rhinoconjunctivitis and ETD.  I last saw her in his clinic during her initial evaluation of 14 April 2018.  Overall she has really done very well on her current plan which includes some anti-inflammatory agents for her airway and allergen avoidance measures directed against house dust mite.  However, she will not take the montelukast because of the fear of developing a side effect from using this medication.  She still continues to have some issues with a "tightness in her face" and some humming in her right ear slightly and she is interested in undergoing a course of immunotherapy.  Allergies as of 05/18/2018   No Known Allergies     Medication List      amLODipine 5 MG tablet Commonly known as:  NORVASC Take 1 tablet (5 mg total) by mouth daily.   atenolol 50 MG tablet Commonly known as:  TENORMIN Take 1 tablet (50 mg total) by mouth daily.   fluticasone 50 MCG/ACT nasal spray Commonly known as:  FLONASE Place 1 spray into both nostrils daily.   hydrALAZINE 25 MG tablet Commonly known as:  APRESOLINE Take 1 tablet (25 mg total) by mouth 3 (three) times daily.   montelukast 10 MG tablet Commonly known as:  SINGULAIR Take 1 tablet (10 mg total) by mouth at bedtime.   Olopatadine HCl 0.7 % Soln Apply 1 drop to eye daily.       Past Medical History:  Diagnosis Date  . Anxiety   . Hypertension     Past Surgical History:  Procedure Laterality Date  . ABDOMINAL HYSTERECTOMY    . APPENDECTOMY    . FOOT SURGERY    . HEEL SPUR SURGERY    . TONSILLECTOMY      Review of systems negative except as noted in  HPI / PMHx or noted below:  Review of Systems  Constitutional: Negative.   HENT: Negative.   Eyes: Negative.   Respiratory: Negative.   Cardiovascular: Negative.   Gastrointestinal: Negative.   Genitourinary: Negative.   Musculoskeletal: Negative.   Skin: Negative.   Neurological: Negative.   Endo/Heme/Allergies: Negative.   Psychiatric/Behavioral: Negative.      Objective:   Vitals:   05/18/18 1559  BP: 138/82  Pulse: 64  Resp: 20  Temp: 98.4 F (36.9 C)   Height: 5' 3.2" (160.5 cm)  Weight: 177 lb 12.8 oz (80.6 kg)   Physical Exam  HENT:  Head: Normocephalic.  Right Ear: Tympanic membrane, external ear and ear canal normal.  Left Ear: Tympanic membrane, external ear and ear canal normal.  Nose: Nose normal. No mucosal edema or rhinorrhea.  Mouth/Throat: Uvula is midline, oropharynx is clear and moist and mucous membranes are normal. No oropharyngeal exudate.  Eyes: Conjunctivae are normal.  Neck: Trachea normal. No tracheal tenderness present. No tracheal deviation present. No thyromegaly present.  Cardiovascular: Normal rate, regular rhythm, S1 normal, S2 normal and normal heart sounds.  No murmur heard. Pulmonary/Chest: Breath sounds normal. No stridor. No respiratory distress. She has no wheezes. She has no rales.  Musculoskeletal: She exhibits no edema.  Lymphadenopathy:       Head (right side):  No tonsillar adenopathy present.       Head (left side): No tonsillar adenopathy present.    She has no cervical adenopathy.  Neurological: She is alert.  Skin: No rash noted. She is not diaphoretic. No erythema. Nails show no clubbing.    Diagnostics: none  Assessment and Plan:   1. Perennial allergic rhinitis   2. Perennial allergic conjunctivitis of both eyes   3. ETD (Eustachian tube dysfunction), right     1.  Allergen avoidance measures - dust mite  2.  Treat and prevent inflammation:   A.  Flonase 1 spray each nostril 1 time per day  3.  If  needed:   A.  Loratadine 10 mg tablet 1 time per day  B.  Olopatadine 0.7% 1 drop each eye 1 time per day  4. Start immunotherapy  5. Return to clinic in February 2020 of earlier if problem  Megan Robbins will be starting a course of immunotherapy while she also continues to utilize anti-inflammatory agents for her airway and allergen avoidance measures directed against dust mite.  I will see her back in this clinic in February 2020 or earlier if there is a problem.  Megan Schimke, MD Allergy / Immunology [facility]

## 2018-05-18 NOTE — Patient Instructions (Signed)
  1.  Allergen avoidance measures - dust mite  2.  Treat and prevent inflammation:   A.  Flonase 1 spray each nostril 1 time per day  3.  If needed:   A.  Loratadine 10 mg tablet 1 time per day  B.  Olopatadine 0.7% 1 drop each eye 1 time per day  4. Start immunotherapy  5. Return to clinic in February 2020 of earlier if problem

## 2018-05-19 ENCOUNTER — Encounter: Payer: Self-pay | Admitting: Allergy and Immunology

## 2018-05-19 NOTE — Addendum Note (Signed)
Addended by: Mliss Fritz I on: 05/19/2018 07:35 AM   Modules accepted: Orders

## 2018-05-31 DIAGNOSIS — J309 Allergic rhinitis, unspecified: Secondary | ICD-10-CM | POA: Diagnosis not present

## 2018-05-31 DIAGNOSIS — H1045 Other chronic allergic conjunctivitis: Secondary | ICD-10-CM | POA: Diagnosis not present

## 2018-05-31 DIAGNOSIS — J3089 Other allergic rhinitis: Secondary | ICD-10-CM | POA: Diagnosis not present

## 2018-06-02 ENCOUNTER — Ambulatory Visit: Payer: Self-pay | Admitting: Internal Medicine

## 2018-06-02 DIAGNOSIS — Z0289 Encounter for other administrative examinations: Secondary | ICD-10-CM

## 2018-06-02 NOTE — Progress Notes (Deleted)
Subjective:    Patient ID: Megan Robbins, female    DOB: 02/15/1936, 82 y.o.   MRN: [id number]  HPI The patient is here for an acute visit.     Medications and allergies reviewed with patient and updated if appropriate.  Patient Active Problem List   Diagnosis Date Noted  . Function kidney decreased 05/13/2017  . Compression fracture of L3 vertebra (HCC) 05/13/2017  . Stress and adjustment reaction 05/08/2017  . Overweight 05/23/2015  . Uncontrolled hypertension 05/08/2015    Current Outpatient Medications on File Prior to Visit  Medication Sig Dispense Refill  . amLODipine (NORVASC) 5 MG tablet Take 1 tablet (5 mg total) by mouth daily. 30 tablet 5  . atenolol (TENORMIN) 50 MG tablet Take 1 tablet (50 mg total) by mouth daily. 30 tablet 5  . fluticasone (FLONASE) 50 MCG/ACT nasal spray Place 1 spray into both nostrils daily. 16 g 2  . hydrALAZINE (APRESOLINE) 25 MG tablet Take 1 tablet (25 mg total) by mouth 3 (three) times daily. 90 tablet 5  . montelukast (SINGULAIR) 10 MG tablet Take 1 tablet (10 mg total) by mouth at bedtime. 30 tablet 5  . Olopatadine HCl (PAZEO) 0.7 % SOLN Apply 1 drop to eye daily. 1 Bottle 0   No current facility-administered medications on file prior to visit.     Past Medical History:  Diagnosis Date  . Anxiety   . Hypertension     Past Surgical History:  Procedure Laterality Date  . ABDOMINAL HYSTERECTOMY    . APPENDECTOMY    . FOOT SURGERY    . HEEL SPUR SURGERY    . TONSILLECTOMY      Social History   Socioeconomic History  . Marital status: Married    Spouse name: Not on file  . Number of children: Not on file  . Years of education: Not on file  . Highest education level: Not on file  Occupational History  . Not on file  Social Needs  . Financial resource strain: Not on file  . Food insecurity:    Worry: Not on file    Inability: Not on file  . Transportation needs:    Medical: Not on file    Non-medical: Not on  file  Tobacco Use  . Smoking status: Never Smoker  . Smokeless tobacco: Never Used  Substance and Sexual Activity  . Alcohol use: No  . Drug use: No  . Sexual activity: Yes    Birth control/protection: Post-menopausal  Lifestyle  . Physical activity:    Days per week: Not on file    Minutes per session: Not on file  . Stress: Not on file  Relationships  . Social connections:    Talks on phone: Not on file    Gets together: Not on file    Attends religious service: Not on file    Active member of club or organization: Not on file    Attends meetings of clubs or organizations: Not on file    Relationship status: Not on file  Other Topics Concern  . Not on file  Social History Narrative   No regular exercise    Family History  Problem Relation Age of Onset  . Hyperlipidemia Mother   . Hypertension Mother   . Diabetes Daughter   . Diabetes Son     Review of Systems     Objective:  There were no vitals filed for this visit. BP Readings from Last 3 Encounters:  05/18/18 138/82  04/14/18 (!) 152/84  03/29/18 (!) 168/80   Wt Readings from Last 3 Encounters:  05/18/18 177 lb 12.8 oz (80.6 kg)  04/14/18 177 lb (80.3 kg)  03/17/18 181 lb (82.1 kg)   There is no height or weight on file to calculate BMI.   Physical Exam         Assessment & Plan:    See Problem List for Assessment and Plan of chronic medical problems.

## 2018-06-07 DIAGNOSIS — J3089 Other allergic rhinitis: Secondary | ICD-10-CM | POA: Diagnosis not present

## 2018-06-08 DIAGNOSIS — H10413 Chronic giant papillary conjunctivitis, bilateral: Secondary | ICD-10-CM | POA: Diagnosis not present

## 2018-06-08 DIAGNOSIS — Z961 Presence of intraocular lens: Secondary | ICD-10-CM | POA: Diagnosis not present

## 2018-06-22 ENCOUNTER — Telehealth: Payer: Self-pay | Admitting: Family

## 2018-06-22 NOTE — Telephone Encounter (Signed)
Copied from CRM [id number]. Topic: General - Other >> Jun 22, 2018  7:40 AM Gerrianne ScalePayne, Angela L wrote: Reason for CRM: pt wanting to switch from Burns to Ria ClockLaura Murray pt doesn't want to say why

## 2018-06-22 NOTE — Telephone Encounter (Signed)
It is ok with me.  

## 2018-06-22 NOTE — Telephone Encounter (Signed)
Patient would like to transfer care from Dr Burns to Laura Murray. °Dr Burns, is this okay with you? °Laura, is this okay with you? °

## 2018-06-24 NOTE — Telephone Encounter (Signed)
Okay; thanks.

## 2018-06-25 NOTE — Telephone Encounter (Signed)
Pt has been sch

## 2018-06-25 NOTE — Telephone Encounter (Signed)
Left message for patient to call back to schedule.  °

## 2018-06-30 ENCOUNTER — Encounter: Payer: Self-pay | Admitting: Podiatry

## 2018-06-30 ENCOUNTER — Ambulatory Visit: Payer: Medicare Other | Admitting: Podiatry

## 2018-06-30 DIAGNOSIS — M722 Plantar fascial fibromatosis: Secondary | ICD-10-CM | POA: Diagnosis not present

## 2018-07-05 NOTE — Progress Notes (Signed)
   Subjective: 82 year old female presenting today for follow up evaluation of bilateral heel pain secondary to plantar fasciitis. She states she is having a flare up of pain in her heels. Standing and walking increases the pain. She has not has any recent treatment for her symptoms. Patient is here for further evaluation and treatment.   Past Medical History:  Diagnosis Date  . Anxiety   . Hypertension      Objective: Physical Exam General: The patient is alert and oriented x3 in no acute distress.  Dermatology: Skin is warm, dry and supple bilateral lower extremities. Negative for open lesions or macerations bilateral.   Vascular: Dorsalis Pedis and Posterior Tibial pulses palpable bilateral.  Capillary fill time is immediate to all digits.  Neurological: Epicritic and protective threshold intact bilateral.   Musculoskeletal: Tenderness to palpation to the plantar aspect of the bilateral heels along the plantar fascia. All other joints range of motion within normal limits bilateral. Strength 5/5 in all groups bilateral.   Assessment: 1. plantar fasciitis bilateral feet - chronic  Plan of Care:  1. Patient evaluated.  2. Patient not currently ready for surgery.  3. Injection of 0.5 mLs Celestone Soluspan injected into the bilateral heels.  4. Continue taking Meloxicam daily.  5. Return to clinic as needed.     Felecia ShellingBrent M. Warren Kugelman, DPM [facility]  Dr. Felecia ShellingBrent M. Analycia Khokhar, DPM    [address]                                   [place], [place] [id number]                Office [phone]  Fax [phone]

## 2018-07-07 DIAGNOSIS — H6983 Other specified disorders of Eustachian tube, bilateral: Secondary | ICD-10-CM | POA: Diagnosis not present

## 2018-07-17 ENCOUNTER — Other Ambulatory Visit: Payer: Self-pay | Admitting: Internal Medicine

## 2018-09-06 ENCOUNTER — Encounter: Payer: Medicare Other | Admitting: Podiatry

## 2018-09-12 NOTE — Progress Notes (Signed)
This encounter was created in error - please disregard.

## 2018-09-13 ENCOUNTER — Encounter: Payer: Medicare Other | Admitting: Family

## 2018-09-20 ENCOUNTER — Ambulatory Visit (INDEPENDENT_AMBULATORY_CARE_PROVIDER_SITE_OTHER): Payer: Medicare Other | Admitting: Internal Medicine

## 2018-09-20 ENCOUNTER — Encounter: Payer: Self-pay | Admitting: Internal Medicine

## 2018-09-20 DIAGNOSIS — M549 Dorsalgia, unspecified: Secondary | ICD-10-CM | POA: Diagnosis not present

## 2018-09-20 MED ORDER — CYCLOBENZAPRINE HCL 5 MG PO TABS: 5.0000 mg | ORAL_TABLET | Freq: Three times a day (TID) | ORAL | 0 refills | Status: DC | PRN

## 2018-09-20 NOTE — Progress Notes (Signed)
   Subjective:   Patient ID: Megan Robbins, female    DOB: 1936/06/07, 83 y.o.   MRN: [id number]  HPI The patient is an 83 YO female coming in for pain in her back. Started about 2 days ago. Denies falls, change in activity. Associated with no other symptoms. Overall it is stable since onset. Denies numbness or weakness or radiation. Has tried nothing. Prior compression fracture L3 which is documented on imaging and noted that she did not have pain associated. No prior dexa.   Review of Systems  Constitutional: Negative for activity change, appetite change, chills, fatigue, fever and unexpected weight change.  Respiratory: Negative.   Cardiovascular: Negative.   Gastrointestinal: Negative.   Musculoskeletal: Positive for back pain and myalgias. Negative for arthralgias, gait problem and joint swelling.  Skin: Negative.   Neurological: Negative.     Objective:  Physical Exam Constitutional:      Appearance: She is well-developed.  HENT:     Head: Normocephalic and atraumatic.  Neck:     Musculoskeletal: Normal range of motion.  Cardiovascular:     Rate and Rhythm: Normal rate and regular rhythm.  Pulmonary:     Effort: Pulmonary effort is normal. No respiratory distress.     Breath sounds: Normal breath sounds. No wheezing or rales.  Abdominal:     General: Bowel sounds are normal. There is no distension.     Palpations: Abdomen is soft.     Tenderness: There is no abdominal tenderness. There is no rebound.  Musculoskeletal:        General: Tenderness present.     Comments: Some tenderness along the scapular regions bilaterally  Skin:    General: Skin is warm and dry.  Neurological:     Mental Status: She is alert and oriented to person, place, and time.     Coordination: Coordination normal.     Vitals:   09/20/18 0819  BP: (!) 170/90  Pulse: 72  Temp: 97.6 F (36.4 C)  TempSrc: Oral  SpO2: 98%  Weight: 175 lb (79.4 kg)  Height: 5' 3.2" (1.605 m)     Assessment & Plan:

## 2018-09-20 NOTE — Assessment & Plan Note (Signed)
Suspect muscular. Advised tylenol as first line for pain. Rx for small supply flexeril if needed. Advised heat. No imaging recommended today.

## 2018-09-20 NOTE — Patient Instructions (Signed)
Tylenol is the safest thing for pain to take. You can take up to a maximum of 3000 mg of tylenol in a 24 hour period.   We have sent in a prescription for a muscle relaxer flexeril to use 1 pill up to 3 times a day as needed if the tylenol does not work.

## 2018-09-27 ENCOUNTER — Ambulatory Visit: Payer: Medicare Other | Admitting: Podiatry

## 2018-09-27 DIAGNOSIS — M722 Plantar fascial fibromatosis: Secondary | ICD-10-CM | POA: Diagnosis not present

## 2018-09-28 ENCOUNTER — Ambulatory Visit: Payer: Self-pay | Admitting: Internal Medicine

## 2018-09-29 NOTE — Progress Notes (Signed)
   Subjective: 83 year old female presenting today for follow up evaluation of bilateral heel pain secondary to plantar fasciitis. She states her pain improved for several weeks but just recently started to return. She has been taking Meloxicam for the pain. Walking and standing for long periods of time increases the symptoms. Patient is here for further evaluation and treatment.   Past Medical History:  Diagnosis Date  . Anxiety   . Hypertension      Objective: Physical Exam General: The patient is alert and oriented x3 in no acute distress.  Dermatology: Skin is warm, dry and supple bilateral lower extremities. Negative for open lesions or macerations bilateral.   Vascular: Dorsalis Pedis and Posterior Tibial pulses palpable bilateral.  Capillary fill time is immediate to all digits.  Neurological: Epicritic and protective threshold intact bilateral.   Musculoskeletal: Tenderness to palpation to the plantar aspect of the bilateral heels along the plantar fascia. All other joints range of motion within normal limits bilateral. Strength 5/5 in all groups bilateral.   Assessment: 1. plantar fasciitis bilateral feet   Plan of Care:  1. Patient evaluated.  2. Injection of 0.5 mLs Celestone Soluspan injected into the bilateral heels.  3. Continue wearing good shoes.  4. Patient considering surgery in the summertime.   5. Return to clinic as needed.     Felecia Shelling, DPM [facility]  Dr. Felecia Shelling, DPM    [address] [place], [place] [address]                Office [phone]  Fax [phone]

## 2018-10-07 ENCOUNTER — Other Ambulatory Visit: Payer: Self-pay | Admitting: Allergy and Immunology

## 2018-10-08 ENCOUNTER — Other Ambulatory Visit: Payer: Self-pay | Admitting: Allergy and Immunology

## 2018-10-11 ENCOUNTER — Encounter: Payer: Self-pay | Admitting: Family

## 2018-10-11 ENCOUNTER — Ambulatory Visit (INDEPENDENT_AMBULATORY_CARE_PROVIDER_SITE_OTHER): Payer: Medicare Other | Admitting: Family

## 2018-10-11 VITALS — BP 158/92 | HR 66 | Temp 97.7°F | Ht 63.2 in | Wt 178.1 lb

## 2018-10-11 DIAGNOSIS — J019 Acute sinusitis, unspecified: Secondary | ICD-10-CM

## 2018-10-11 MED ORDER — CEFDINIR 300 MG PO CAPS: 300.0000 mg | ORAL_CAPSULE | Freq: Two times a day (BID) | ORAL | 0 refills | Status: DC

## 2018-10-11 NOTE — Progress Notes (Signed)
Megan Robbins is a 83 y.o. female with the following history as recorded in EpicCare:  Patient Active Problem List   Diagnosis Date Noted  . Eustachian tube dysfunction, right 04/01/2018  . Impacted cerumen of left ear 04/01/2018  . Seasonal allergic rhinitis 04/01/2018  . Function kidney decreased 05/13/2017  . Compression fracture of L3 vertebra (HCC) 05/13/2017  . Mid back pain 05/08/2017  . Stress and adjustment reaction 05/08/2017  . Overweight 05/23/2015  . Uncontrolled hypertension 05/08/2015    Current Outpatient Medications  Medication Sig Dispense Refill  . amLODipine (NORVASC) 5 MG tablet TAKE 1 TABLET(5 MG) BY MOUTH DAILY 30 tablet 2  . atenolol (TENORMIN) 50 MG tablet Take 1 tablet (50 mg total) by mouth daily. 30 tablet 5  . cyclobenzaprine (FLEXERIL) 5 MG tablet Take 1 tablet (5 mg total) by mouth 3 (three) times daily as needed for muscle spasms. 30 tablet 0  . fexofenadine (ALLEGRA) 180 MG tablet Take 180 mg by mouth daily.    . fluticasone (FLONASE) 50 MCG/ACT nasal spray Place 1 spray into both nostrils daily. 16 g 2  . hydrALAZINE (APRESOLINE) 25 MG tablet Take 1 tablet (25 mg total) by mouth 3 (three) times daily. 90 tablet 5  . Olopatadine HCl (PAZEO) 0.7 % SOLN Apply 1 drop to eye daily. 1 Bottle 0  . cefdinir (OMNICEF) 300 MG capsule Take 1 capsule (300 mg total) by mouth 2 (two) times daily. 20 capsule 0   No current facility-administered medications for this visit.     Allergies: Patient has no known allergies.  Past Medical History:  Diagnosis Date  . Anxiety   . Hypertension     Past Surgical History:  Procedure Laterality Date  . ABDOMINAL HYSTERECTOMY    . APPENDECTOMY    . FOOT SURGERY    . HEEL SPUR SURGERY    . TONSILLECTOMY      Family History  Problem Relation Age of Onset  . Hyperlipidemia Mother   . Hypertension Mother   . Diabetes Daughter   . Diabetes Son     Social History   Tobacco Use  . Smoking status: Never Smoker  .  Smokeless tobacco: Never Used  Substance Use Topics  . Alcohol use: No    Subjective:  Patient presents with 4 day history of cough/ congestion; + headaches, fatigue; no chest pain or shortness of breath; does have seasonal allergies- re-started her Allegra last week and seemed to help initially; does take Flonase daily;    Objective:  Vitals:   10/11/18 1024  BP: (!) 158/92  Pulse: 66  Temp: 97.7 F (36.5 C)  TempSrc: Oral  SpO2: 98%  Weight: 178 lb 1.9 oz (80.8 kg)  Height: 5' 3.2" (1.605 m)    General: Well developed, well nourished, in no acute distress  Skin : Warm and dry.  Head: Normocephalic and atraumatic  Eyes: Sclera and conjunctiva clear; pupils round and reactive to light; extraocular movements intact  Ears: External normal; canals clear; tympanic membranes normal  Oropharynx: Pink, supple. No suspicious lesions  Neck: Supple without thyromegaly, adenopathy  Lungs: Respirations unlabored; clear to auscultation bilaterally without wheeze, rales, rhonchi  CVS exam: normal rate and regular rhythm.  Neurologic: Alert and oriented; speech intact; face symmetrical; moves all extremities well; CNII-XII intact without focal deficit   Assessment:  1. Acute sinusitis, recurrence not specified, unspecified location     Plan:  Suspect allergy component; continue Allegra and Flonase; Rx for Omnicef 300 mg  bid x 10 days;  Encouraged patient to bring her blood pressure cuff to next OV- ? White coat hypertension vs uncontrolled hypertension.  No follow-ups on file.  No orders of the defined types were placed in this encounter.   Requested Prescriptions   Signed Prescriptions Disp Refills  . cefdinir (OMNICEF) 300 MG capsule 20 capsule 0    Sig: Take 1 capsule (300 mg total) by mouth 2 (two) times daily.

## 2018-10-17 ENCOUNTER — Other Ambulatory Visit: Payer: Self-pay | Admitting: Internal Medicine

## 2018-11-15 ENCOUNTER — Ambulatory Visit: Payer: Medicare Other | Admitting: Podiatry

## 2018-11-17 ENCOUNTER — Other Ambulatory Visit: Payer: Self-pay

## 2018-11-17 ENCOUNTER — Ambulatory Visit: Payer: Medicare Other | Admitting: Podiatry

## 2018-11-17 VITALS — Temp 96.3°F

## 2018-11-17 DIAGNOSIS — M7672 Peroneal tendinitis, left leg: Secondary | ICD-10-CM | POA: Diagnosis not present

## 2018-11-17 DIAGNOSIS — M722 Plantar fascial fibromatosis: Secondary | ICD-10-CM

## 2018-11-17 MED ORDER — MELOXICAM 15 MG PO TABS
15.0000 mg | ORAL_TABLET | Freq: Every day | ORAL | 0 refills | Status: DC
Start: 2018-11-17 — End: 2018-12-09

## 2018-11-17 NOTE — Progress Notes (Signed)
   HPI: Established patient presents the office today for a new complaint regarding some pain and tenderness that began approximately 1 week ago.  Patient states that she woke up in the morning with severely inflamed painful left foot.  Patient says she was unable to bear weight on the foot for approximately 3 days.  She denies injury or any change in activity.  She went to the pharmacy and they recommended an OTC ankle brace.  She has been wearing it and it is improved significantly.  She presents for further treatment evaluation  Past Medical History:  Diagnosis Date  . Anxiety   . Hypertension      Physical Exam: General: The patient is alert and oriented x3 in no acute distress.  Dermatology: Skin is warm, dry and supple bilateral lower extremities. Negative for open lesions or macerations.  Vascular: Palpable pedal pulses bilaterally. No edema or erythema noted. Capillary refill within normal limits.  Neurological: Epicritic and protective threshold grossly intact bilaterally.   Musculoskeletal Exam: Range of motion within normal limits to all pedal and ankle joints bilateral. Muscle strength 5/5 in all groups bilateral.  There is some tenderness to palpation along the mid substance of the plantar fascia as well as the insertion of the peroneal tendons at the fifth metatarsal tubercle.  Assessment: 1.  Plantar fasciitis left 2.  Peroneal tendinitis left   Plan of Care:  1. Patient evaluated.   2.  Continue wearing OTC ankle brace that was given to her at the pharmacy with good supportive shoes 3.  Prescription for meloxicam 15 mg x 2 weeks. 4.  Return to clinic as needed      Felecia Shelling, DPM [facility]  Dr. Felecia Shelling, DPM    [address] [place], [place] [address]                Office [phone]  Fax [phone]

## 2018-11-19 ENCOUNTER — Encounter: Payer: Medicare Other | Admitting: Family

## 2018-12-09 ENCOUNTER — Other Ambulatory Visit: Payer: Self-pay

## 2018-12-09 MED ORDER — MELOXICAM 15 MG PO TABS: 15.0000 mg | ORAL_TABLET | Freq: Every day | ORAL | 0 refills | Status: DC

## 2018-12-16 ENCOUNTER — Other Ambulatory Visit: Payer: Self-pay | Admitting: Internal Medicine

## 2019-01-07 ENCOUNTER — Ambulatory Visit: Payer: Self-pay | Admitting: Family

## 2019-01-15 ENCOUNTER — Other Ambulatory Visit: Payer: Self-pay | Admitting: Internal Medicine

## 2019-01-17 ENCOUNTER — Other Ambulatory Visit: Payer: Self-pay | Admitting: Internal Medicine

## 2019-01-18 ENCOUNTER — Other Ambulatory Visit: Payer: Self-pay | Admitting: Internal Medicine

## 2019-01-31 ENCOUNTER — Ambulatory Visit: Payer: Medicare Other | Admitting: Podiatry

## 2019-01-31 ENCOUNTER — Encounter: Payer: Self-pay | Admitting: Podiatry

## 2019-01-31 ENCOUNTER — Other Ambulatory Visit: Payer: Self-pay

## 2019-01-31 ENCOUNTER — Encounter

## 2019-01-31 DIAGNOSIS — M722 Plantar fascial fibromatosis: Secondary | ICD-10-CM | POA: Diagnosis not present

## 2019-02-02 NOTE — Progress Notes (Signed)
   Subjective: 83 y.o. female presenting today for follow up evaluation of bilateral heel pain. She reports some continued pain. She states she is willing to get injections for treatment. She states she took the Meloxicam but does not want to take it again. There are no modifying factors noted. Patient is here for further evaluation and treatment.   Past Medical History:  Diagnosis Date  . Anxiety   . Hypertension      Objective: Physical Exam General: The patient is alert and oriented x3 in no acute distress.  Dermatology: Skin is warm, dry and supple bilateral lower extremities. Negative for open lesions or macerations bilateral.   Vascular: Dorsalis Pedis and Posterior Tibial pulses palpable bilateral.  Capillary fill time is immediate to all digits.  Neurological: Epicritic and protective threshold intact bilateral.   Musculoskeletal: Tenderness to palpation to the plantar aspect of the bilateral heels along the plantar fascia. All other joints range of motion within normal limits bilateral. Strength 5/5 in all groups bilateral.   Assessment: 1. plantar fasciitis bilateral feet  Plan of Care:  1. Patient evaluated.   2. Injection of 0.5cc Celestone soluspan injected into the bilateral heels.  3. Patient does not want oral medications.  4. Patient wants surgery at some point in the future.  5. Continue wearing good shoe gear.  6. Return to clinic as needed.     Edrick Kins, DPM [facility]  Dr. Edrick Kins, DPM    [address], [place] [address]                Office [phone]  Fax [phone]

## 2019-02-07 ENCOUNTER — Ambulatory Visit: Payer: Medicare Other | Admitting: Family

## 2019-02-18 ENCOUNTER — Other Ambulatory Visit: Payer: Self-pay | Admitting: Internal Medicine

## 2019-02-21 ENCOUNTER — Other Ambulatory Visit: Payer: Self-pay | Admitting: Internal Medicine

## 2019-03-01 DIAGNOSIS — H10413 Chronic giant papillary conjunctivitis, bilateral: Secondary | ICD-10-CM | POA: Diagnosis not present

## 2019-03-01 DIAGNOSIS — H0102B Squamous blepharitis left eye, upper and lower eyelids: Secondary | ICD-10-CM | POA: Diagnosis not present

## 2019-03-01 DIAGNOSIS — H0102A Squamous blepharitis right eye, upper and lower eyelids: Secondary | ICD-10-CM | POA: Diagnosis not present

## 2019-03-01 DIAGNOSIS — Z961 Presence of intraocular lens: Secondary | ICD-10-CM | POA: Diagnosis not present

## 2019-03-21 ENCOUNTER — Other Ambulatory Visit: Payer: Self-pay | Admitting: Internal Medicine

## 2019-04-01 ENCOUNTER — Ambulatory Visit: Payer: Medicare Other | Admitting: Family

## 2019-04-08 ENCOUNTER — Ambulatory Visit (INDEPENDENT_AMBULATORY_CARE_PROVIDER_SITE_OTHER): Payer: Medicare Other | Admitting: Family

## 2019-04-08 ENCOUNTER — Other Ambulatory Visit: Payer: Self-pay

## 2019-04-08 ENCOUNTER — Encounter: Payer: Self-pay | Admitting: Family

## 2019-04-08 VITALS — BP 138/80 | HR 67 | Temp 97.9°F | Ht 63.2 in | Wt 169.1 lb

## 2019-04-08 DIAGNOSIS — E2839 Other primary ovarian failure: Secondary | ICD-10-CM

## 2019-04-08 DIAGNOSIS — E559 Vitamin D deficiency, unspecified: Secondary | ICD-10-CM

## 2019-04-08 DIAGNOSIS — Z13220 Encounter for screening for lipoid disorders: Secondary | ICD-10-CM

## 2019-04-08 DIAGNOSIS — I10 Essential (primary) hypertension: Secondary | ICD-10-CM | POA: Diagnosis not present

## 2019-04-08 MED ORDER — HYDRALAZINE HCL 25 MG PO TABS: 25.0000 mg | ORAL_TABLET | Freq: Two times a day (BID) | ORAL | 1 refills | Status: DC

## 2019-04-08 MED ORDER — ATENOLOL 50 MG PO TABS: 50.0000 mg | ORAL_TABLET | Freq: Every day | ORAL | 1 refills | Status: DC

## 2019-04-08 MED ORDER — AMLODIPINE BESYLATE 2.5 MG PO TABS: 2.5000 mg | ORAL_TABLET | Freq: Every day | ORAL | 1 refills | Status: DC

## 2019-04-08 NOTE — Progress Notes (Signed)
Megan Robbins is a 83 y.o. female with the following history as recorded in EpicCare:  Patient Active Problem List   Diagnosis Date Noted  . Eustachian tube dysfunction, right 04/01/2018  . Impacted cerumen of left ear 04/01/2018  . Seasonal allergic rhinitis 04/01/2018  . Function kidney decreased 05/13/2017  . Compression fracture of L3 vertebra ([place]) 05/13/2017  . Mid back pain 05/08/2017  . Stress and adjustment reaction 05/08/2017  . Overweight 05/23/2015  . Essential hypertension 05/08/2015    Current Outpatient Medications  Medication Sig Dispense Refill  . amLODipine (NORVASC) 2.5 MG tablet Take 1 tablet (2.5 mg total) by mouth daily. 90 tablet 1  . atenolol (TENORMIN) 50 MG tablet Take 1 tablet (50 mg total) by mouth daily. 90 tablet 1  . cyclobenzaprine (FLEXERIL) 5 MG tablet Take 1 tablet (5 mg total) by mouth 3 (three) times daily as needed for muscle spasms. 30 tablet 0  . fexofenadine (ALLEGRA) 180 MG tablet Take 180 mg by mouth daily.    . fluticasone (FLONASE) 50 MCG/ACT nasal spray Place 1 spray into both nostrils daily. 16 g 2  . hydrALAZINE (APRESOLINE) 25 MG tablet Take 1 tablet (25 mg total) by mouth 2 (two) times daily. 180 tablet 1   No current facility-administered medications for this visit.     Allergies: Patient has no known allergies.  Past Medical History:  Diagnosis Date  . Anxiety   . Hypertension     Past Surgical History:  Procedure Laterality Date  . ABDOMINAL HYSTERECTOMY    . APPENDECTOMY    . FOOT SURGERY    . HEEL SPUR SURGERY    . TONSILLECTOMY      Family History  Problem Relation Age of Onset  . Hyperlipidemia Mother   . Hypertension Mother   . Diabetes Daughter   . Diabetes Son     Social History   Tobacco Use  . Smoking status: Never Smoker  . Smokeless tobacco: Never Used  Substance Use Topics  . Alcohol use: No    Subjective:  Follow-up on hypertension; notes she has been making lifestyle changes- has cut back on  sodas and salt; very excited to see how much better her blood pressure is today; has lost 10 pounds since last OV; admits only taking Hydralazine once a day- not 3 x per day as prescribed; Would prefer to come back at later date for labs and DEXA; agrees to flu shot at later date; does not want to continue getting mammograms.  Working with allergist- doing well on combination of Allegra and Flonase;    Objective:  Vitals:   04/08/19 1514  BP: 138/80  Pulse: 67  Temp: 97.9 F (36.6 C)  TempSrc: Oral  SpO2: 98%  Weight: 169 lb 1.3 oz (76.7 kg)  Height: 5' 3.2" (1.605 m)    General: Well developed, well nourished, in no acute distress  Skin : Warm and dry.  Head: Normocephalic and atraumatic  Eyes: Sclera and conjunctiva clear; pupils round and reactive to light; extraocular movements intact  Ears: External normal; canals clear; tympanic membranes normal  Oropharynx: Pink, supple. No suspicious lesions  Neck: Supple without thyromegaly, adenopathy  Lungs: Respirations unlabored; clear to auscultation bilaterally without wheeze, rales, rhonchi  CVS exam: normal rate and regular rhythm.  Neurologic: Alert and oriented; speech intact; face symmetrical; moves all extremities well; CNII-XII intact without focal deficit   Assessment:  1. Essential hypertension   2. Lipid screening   3. Vitamin D  deficiency   4. Ovarian failure   5. Uncontrolled hypertension     Plan:  1. Good improvement with salt reduction/ weight loss; have asked patient to take her Hydralazine 25 mg bid at a minimum since she has only been taking qd; continue Amlodipine and Atenolol; follow-up in 6 months; She does agree to return to the office in the next 2-3 weeks to update labs, update DEXA and get her flu shot;   No follow-ups on file.  Orders Placed This Encounter  Procedures  . DG Bone Density    Standing Status:   Future    Standing Expiration Date:   06/07/2020    Order Specific Question:   Reason for  Exam (SYMPTOM  OR DIAGNOSIS REQUIRED)    Answer:   ovarian failure    Order Specific Question:   Preferred imaging location?    Answer:   [facility]  . CBC w/Diff    Standing Status:   Future    Standing Expiration Date:   04/07/2020  . Comp Met (CMET)    Standing Status:   Future    Standing Expiration Date:   04/07/2020  . Lipid panel    Standing Status:   Future    Standing Expiration Date:   04/07/2020  . Vitamin D (25 hydroxy)    Standing Status:   Future    Standing Expiration Date:   04/07/2020    Requested Prescriptions   Signed Prescriptions Disp Refills  . amLODipine (NORVASC) 2.5 MG tablet 90 tablet 1    Sig: Take 1 tablet (2.5 mg total) by mouth daily.  . hydrALAZINE (APRESOLINE) 25 MG tablet 180 tablet 1    Sig: Take 1 tablet (25 mg total) by mouth 2 (two) times daily.  Marland Kitchen atenolol (TENORMIN) 50 MG tablet 90 tablet 1    Sig: Take 1 tablet (50 mg total) by mouth daily.

## 2019-04-14 ENCOUNTER — Ambulatory Visit: Payer: Self-pay

## 2019-04-14 NOTE — Telephone Encounter (Signed)
Returned call to patient who is taking hydralazine and was wondering if it could cause her to urinate more frequently. Pt denies burning urination.  She states that she feels that she is emptying her bladder fully. She states that she heard that the medication could cause more frequent urination and was curious. She also had C/O left shoulder pain. She denies chest pain/pressure.  She denies neck/jaw pain. She states that she has had this catch in her shoulder for 2 days. She feels that she slept wrong. She has had similar pain in the past in the right shoulder. She has had no other symptoms. Home care advice read to patient. She verbalized understanding. We reviewed S/S of UTI to report and Chest pain SOB pain radiating to jaw neck or down her arm. She verbalized understanding of all additional instructions.  Reason for Disposition . Shoulder pain  Answer Assessment - Initial Assessment Questions 1. SYMPTOM: "What's the main symptom you're concerned about?" (e.g., frequency, incontinence)     frequencey 2. ONSET: "When did the frequency  start?"     unsure 3. PAIN: "Is there any pain?" If so, ask: "How bad is it?" (Scale: 1-10; mild, moderate, severe)     No 4. CAUSE: "What do you think is causing the symptoms?"     unsure 5. OTHER SYMPTOMS: "Do you have any other symptoms?" (e.g., fever, flank pain, blood in urine, pain with urination)    none 6. PREGNANCY: "Is there any chance you are pregnant?" "When was your last menstrual period?"     N/A  Answer Assessment - Initial Assessment Questions 1. ONSET: "When did the pain start?"     2 days ago 2. LOCATION: "Where is the pain located?"     Left side 3. PAIN: "How bad is the pain?" (Scale 1-10; or mild, moderate, severe)   - MILD (1-3): doesn't interfere with normal activities   - MODERATE (4-7): interferes with normal activities (e.g., work or school) or awakens from sleep   - SEVERE (8-10): excruciating pain, unable to do any normal  activities, unable to move arm at all due to pain     mild 4. WORK OR EXERCISE: "Has there been any recent work or exercise that involved this part of the body?"    Slept on it wrong 5. CAUSE: "What do you think is causing the shoulder pain?"     unsure 6. OTHER SYMPTOMS: "Do you have any other symptoms?" (e.g., neck pain, swelling, rash, fever, numbness, weakness)     none 7. PREGNANCY: "Is there any chance you are pregnant?" "When was your last menstrual period?"     N/A  Protocols used: SHOULDER PAIN-A-AH, URINARY [facility]

## 2019-04-25 ENCOUNTER — Other Ambulatory Visit: Payer: Self-pay

## 2019-04-25 ENCOUNTER — Ambulatory Visit: Payer: Medicare Other | Admitting: Podiatry

## 2019-04-25 DIAGNOSIS — L600 Ingrowing nail: Secondary | ICD-10-CM | POA: Diagnosis not present

## 2019-04-25 DIAGNOSIS — M722 Plantar fascial fibromatosis: Secondary | ICD-10-CM

## 2019-04-27 ENCOUNTER — Other Ambulatory Visit: Payer: Self-pay | Admitting: Internal Medicine

## 2019-04-27 DIAGNOSIS — I10 Essential (primary) hypertension: Secondary | ICD-10-CM

## 2019-04-29 NOTE — Progress Notes (Signed)
   Subjective: Patient presents today for evaluation of pain to the medial border of the right hallux that began two weeks ago. Patient is concerned for possible ingrown nail. Applying pressure to the area increases the pain. She has not had any treatment for the symptoms.  She is also here for follow up evaluation of bilateral plantar fasciitis. She reports continued pain even after receiving the injections. She has not done anything further for treatment as she declines oral medication. There are no modifying factors noted. Patient presents today for further treatment and evaluation.  Past Medical History:  Diagnosis Date  . Anxiety   . Hypertension     Objective:  General: Well developed, nourished, in no acute distress, alert and oriented x3   Dermatology: Skin is warm, dry and supple bilateral. Medial border of the right hallux appears to be erythematous with evidence of an ingrowing nail. Pain on palpation noted to the border of the nail fold. The remaining nails appear unremarkable at this time. There are no open sores, lesions.  Vascular: Dorsalis Pedis artery and Posterior Tibial artery pedal pulses palpable. No lower extremity edema noted.   Neruologic: Grossly intact via light touch bilateral.  Musculoskeletal: Tenderness to palpation to the plantar aspect of the bilateral heels along the plantar fascia. Muscular strength within normal limits in all groups bilateral. Normal range of motion noted to all pedal and ankle joints.   Assesement: #1 Paronychia with ingrowing nail medial border right hallux #2 Incurvated nail #3 Plantar fasciitis bilateral   Plan of Care:  1. Patient evaluated.  2. Injection of 0.5 mLs Celestone Soluspan injected into the bilateral heels.  3. Mechanical debridement of the right great toenail performed using a nail nipper. Filed with dremel without incident.  4. Return to clinic in three months for surgical consult EPF bilateral. Patient wants  surgery after the holidays.    Edrick Kins, DPM [facility]  Dr. Edrick Kins, [place]                                        [place], [place] [address]                Office [phone]  Fax [phone]

## 2019-05-06 ENCOUNTER — Inpatient Hospital Stay: Admission: RE | Admit: 2019-05-06 | Payer: Medicare Other | Source: Ambulatory Visit

## 2019-05-06 ENCOUNTER — Ambulatory Visit: Payer: Medicare Other

## 2019-05-20 ENCOUNTER — Ambulatory Visit: Payer: Medicare Other

## 2019-06-23 ENCOUNTER — Other Ambulatory Visit: Payer: Self-pay

## 2019-06-23 ENCOUNTER — Ambulatory Visit (INDEPENDENT_AMBULATORY_CARE_PROVIDER_SITE_OTHER): Payer: Medicare Other | Admitting: Internal Medicine

## 2019-06-23 ENCOUNTER — Encounter: Payer: Self-pay | Admitting: Internal Medicine

## 2019-06-23 VITALS — BP 160/100 | HR 65 | Temp 97.7°F | Ht 63.2 in | Wt 171.0 lb

## 2019-06-23 DIAGNOSIS — R21 Rash and other nonspecific skin eruption: Secondary | ICD-10-CM | POA: Diagnosis not present

## 2019-06-23 DIAGNOSIS — I10 Essential (primary) hypertension: Secondary | ICD-10-CM | POA: Diagnosis not present

## 2019-06-23 DIAGNOSIS — J302 Other seasonal allergic rhinitis: Secondary | ICD-10-CM

## 2019-06-23 DIAGNOSIS — F4329 Adjustment disorder with other symptoms: Secondary | ICD-10-CM

## 2019-06-23 HISTORY — DX: Rash and other nonspecific skin eruption: R21

## 2019-06-23 MED ORDER — FEXOFENADINE HCL 180 MG PO TABS: 180.0000 mg | ORAL_TABLET | Freq: Every day | ORAL | 3 refills | Status: DC

## 2019-06-23 MED ORDER — TRIAMCINOLONE ACETONIDE 0.1 % EX CREA: 1.0000 "application " | TOPICAL_CREAM | Freq: Two times a day (BID) | CUTANEOUS | 0 refills | Status: AC

## 2019-06-23 NOTE — Patient Instructions (Signed)
Please take all new medication as prescribed - the allegra and the cream  Please return if you change your mind about the steroid shot and prednisone pills  Please continue all other medications as before, and refills have been done if requested.  Please have the pharmacy call with any other refills you may need.  Please keep your appointments with your specialists as you may have planned

## 2019-06-23 NOTE — Assessment & Plan Note (Signed)
C/w allergic type, offered depomedrol and predpac but pt wary of side effects, for allegra restart, and/or benadryl, triam cr prn,  to f/u any worsening symptoms or concerns

## 2019-06-23 NOTE — Assessment & Plan Note (Signed)
declnies change in tx for now or counseling referral

## 2019-06-23 NOTE — Assessment & Plan Note (Signed)
stable overall by history and exam, recent data reviewed with pt, and pt to continue medical treatment as before,  to f/u any worsening symptoms or concerns  

## 2019-06-23 NOTE — Progress Notes (Signed)
Subjective:    Patient ID: Megan Robbins, female    DOB: 04/24/1936, 83 y.o.   MRN: [id number]  HPI  Here with 2 days onset itchy red raised rash with various sized welps to torso and extremities the larges about 4 x 5 cm left wrist and hand.constant, mild to mod, nothing seems to make better or worse.  No prior hx, no recent med change or other foods, no current suspicious meds.  Pt denies chest pain, increased sob or doe, wheezing, orthopnea, PND, increased LE swelling, palpitations, dizziness or syncope.  Pt denies new neurological symptoms such as new headache, or facial or extremity weakness or numbness   Pt denies polydipsia, polyuria. Denies worsening depressive symptoms, suicidal ideation, or panic; has ongoing anxiety, d/w pt but does not want x today.   Past Medical History:  Diagnosis Date  . Anxiety   . Hypertension    Past Surgical History:  Procedure Laterality Date  . ABDOMINAL HYSTERECTOMY    . APPENDECTOMY    . FOOT SURGERY    . HEEL SPUR SURGERY    . TONSILLECTOMY      reports that she has never smoked. She has never used smokeless tobacco. She reports that she does not drink alcohol or use drugs. family history includes Diabetes in her daughter and son; Hyperlipidemia in her mother; Hypertension in her mother. No Known Allergies Current Outpatient Medications on File Prior to Visit  Medication Sig Dispense Refill  . amLODipine (NORVASC) 2.5 MG tablet Take 1 tablet (2.5 mg total) by mouth daily. 90 tablet 1  . atenolol (TENORMIN) 50 MG tablet Take 1 tablet (50 mg total) by mouth daily. 90 tablet 1  . cyclobenzaprine (FLEXERIL) 5 MG tablet Take 1 tablet (5 mg total) by mouth 3 (three) times daily as needed for muscle spasms. 30 tablet 0  . fluticasone (FLONASE) 50 MCG/ACT nasal spray Place 1 spray into both nostrils daily. 16 g 2  . hydrALAZINE (APRESOLINE) 25 MG tablet Take 1 tablet (25 mg total) by mouth 2 (two) times daily. 180 tablet 1   No current  facility-administered medications on file prior to visit.    Review of Systems  Constitutional: Negative for other unusual diaphoresis or sweats HENT: Negative for ear discharge or swelling Eyes: Negative for other worsening visual disturbances Respiratory: Negative for stridor or other swelling  Gastrointestinal: Negative for worsening distension or other blood Genitourinary: Negative for retention or other urinary change Musculoskeletal: Negative for other MSK pain or swelling Skin: Negative for color change or other new lesions Neurological: Negative for worsening tremors and other numbness  Psychiatric/Behavioral: Negative for worsening agitation or other fatigue All otherwise neg per pt    Objective:   Physical Exam BP (!) 160/100 (BP Location: Left Arm, Patient Position: Sitting, Cuff Size: Large)   Pulse 65   Temp 97.7 F (36.5 C) (Oral)   Ht 5' 3.2" (1.605 m)   Wt 171 lb (77.6 kg)   SpO2 97%   BMI 30.10 kg/m  VS noted,  Constitutional: Pt appears in NAD HENT: Head: NCAT.  Right Ear: External ear normal.  Left Ear: External ear normal.  Eyes: . Pupils are equal, round, and reactive to light. Conjunctivae and EOM are normal Nose: without d/c or deformity Neck: Neck supple. Gross normal ROM Cardiovascular: Normal rate and regular rhythm.   Pulmonary/Chest: Effort normal and breath sounds without rales or wheezing.  Abd:  Soft, NT, ND, + BS, no organomegaly Neurological: Pt is alert.  At baseline orientation, motor grossly intact Skin: Skin is warm.with hive like rash with large wheals to left wrist and torso, No other new lesions, no LE edema Psychiatric: Pt behavior is normal without agitation  All otherwise neg per pt Lab Results  Component Value Date   GLUCOSE 100 (H) 05/08/2017   ALT 10 05/08/2017   AST 10 05/08/2017   NA 137 05/08/2017   K 3.7 05/08/2017   CL 102 05/08/2017   CREATININE 1.70 (H) 05/08/2017   BUN 33 (H) 05/08/2017   CO2 26 05/08/2017         Assessment & Plan:

## 2019-06-23 NOTE — Assessment & Plan Note (Signed)
Also to improved with allegra asd,  to f/u any worsening symptoms or concerns

## 2019-07-25 ENCOUNTER — Other Ambulatory Visit: Payer: Self-pay

## 2019-07-25 ENCOUNTER — Ambulatory Visit: Payer: Medicare Other | Admitting: Podiatry

## 2019-07-25 DIAGNOSIS — M722 Plantar fascial fibromatosis: Secondary | ICD-10-CM

## 2019-07-25 DIAGNOSIS — L600 Ingrowing nail: Secondary | ICD-10-CM

## 2019-07-25 NOTE — Patient Instructions (Signed)
Pre-Operative Instructions  Congratulations, you have decided to take an important step towards improving your quality of life.  You can be assured that the doctors and staff at [facility] will be with you every step of the way.  Here are some important things you should know:  1. Plan to be at the surgery center/hospital at least 1 (one) hour prior to your scheduled time, unless otherwise directed by the surgical center/hospital staff.  You must have a responsible adult accompany you, remain during the surgery and drive you home.  Make sure you have directions to the surgical center/hospital to ensure you arrive on time. 2. If you are having surgery at [facility] or [facility], you will need a copy of your medical history and physical form from your family physician within one month prior to the date of surgery. We will give you a form for your primary physician to complete.  3. We make every effort to accommodate the date you request for surgery.  However, there are times where surgery dates or times have to be moved.  We will contact you as soon as possible if a change in schedule is required.   4. No aspirin/ibuprofen for one week before surgery.  If you are on aspirin, any non-steroidal anti-inflammatory medications (Mobic, Aleve, Ibuprofen) should not be taken seven (7) days prior to your surgery.  You make take Tylenol for pain prior to surgery.  5. Medications - If you are taking daily heart and blood pressure medications, seizure, reflux, allergy, asthma, anxiety, pain or diabetes medications, make sure you notify the surgery center/hospital before the day of surgery so they can tell you which medications you should take or avoid the day of surgery. 6. No food or drink after midnight the night before surgery unless directed otherwise by surgical center/hospital staff. 7. No alcoholic beverages 24-hours prior to surgery.  No smoking 24-hours prior or 24-hours after  surgery. 8. Wear loose pants or shorts. They should be loose enough to fit over bandages, boots, and casts. 9. Don't wear slip-on shoes. Sneakers are preferred. 10. Bring your boot with you to the surgery center/hospital.  Also bring crutches or a walker if your physician has prescribed it for you.  If you do not have this equipment, it will be provided for you after surgery. 11. If you have not been contacted by the surgery center/hospital by the day before your surgery, call to confirm the date and time of your surgery. 12. Leave-time from work may vary depending on the type of surgery you have.  Appropriate arrangements should be made prior to surgery with your employer. 13. Prescriptions will be provided immediately following surgery by your doctor.  Fill these as soon as possible after surgery and take the medication as directed. Pain medications will not be refilled on weekends and must be approved by the doctor. 14. Remove nail polish on the operative foot and avoid getting pedicures prior to surgery. 15. Wash the night before surgery.  The night before surgery wash the foot and leg well with water and the antibacterial soap provided. Be sure to pay special attention to beneath the toenails and in between the toes.  Wash for at least three (3) minutes. Rinse thoroughly with water and dry well with a towel.  Perform this wash unless told not to do so by your physician.  Enclosed: 1 Ice pack (please put in freezer the night before surgery)   1 Hibiclens skin cleaner     Pre-op instructions  If you have any questions regarding the instructions, please do not hesitate to call our office.  [place]: [address], [place], [place] [address] -- [phone]  [place]: [address] [place], [place] [address] -- [phone]  [place]: [address], [place], [place] [address] -- [phone]   Website: [email or web address] 

## 2019-07-30 NOTE — Progress Notes (Signed)
   Subjective: Patient presents today for follow up evaluation of an ingrown nail to the medial border of the right hallux and plantar fasciitis of the right foot. She states the nail is relatively unchanged and is still painful to touch.  She states the right foot pain has improved after receiving the last injection however it has not resolved. Being on the foot increases the pain. She has tried resting the foot as much as possible. Patient presents today for further treatment and evaluation.  Past Medical History:  Diagnosis Date  . Anxiety   . Hypertension     Objective:  General: Well developed, nourished, in no acute distress, alert and oriented x3   Dermatology: Skin is warm, dry and supple bilateral. Medial border of the right hallux appears to be erythematous with evidence of an ingrowing nail. Pain on palpation noted to the border of the nail fold. The remaining nails appear unremarkable at this time. There are no open sores, lesions.  Vascular: Dorsalis Pedis artery and Posterior Tibial artery pedal pulses palpable. No lower extremity edema noted.   Neruologic: Grossly intact via light touch bilateral.  Musculoskeletal: Tenderness to palpation to the plantar aspect of the bilateral heels along the plantar fascia. Muscular strength within normal limits in all groups bilateral. Normal range of motion noted to all pedal and ankle joints.   Assesement: #1 Paronychia with ingrowing nail medial border right hallux #2 Incurvated nail #3 Plantar fasciitis bilateral   Plan of Care:  1. Patient evaluated.  2. Injection of 0.5 mLs Celestone Soluspan injected into the bilateral heels.  3. Mechanical debridement of the right great toenail performed using a nail nipper. Filed with dremel without incident.  4. Today we discussed the conservative versus surgical management of the presenting pathology. The patient opts for surgical management. All possible complications and details of the  procedure were explained. All patient questions were answered. No guarantees were expressed or implied. 5. Authorization for surgery was initiated today. Surgery will consist of EPF bilateral; partial permanent nail avulsion medial border right hallux.  6. Return to clinic one week post op.      Edrick Kins, DPM [facility]  Dr. Edrick Kins, [place]                                        [place], [place] [address]                Office [phone]  Fax [phone]

## 2019-09-14 ENCOUNTER — Telehealth: Payer: Self-pay

## 2019-09-14 NOTE — Telephone Encounter (Signed)
Patient called wanting to cancel her surgery on 09/22/2019 with Dr. Logan Bores. She is concerned with COVID and would like to wait.  Left message for patient that I received her call and will cancel her surgery.

## 2019-10-02 ENCOUNTER — Ambulatory Visit: Payer: Medicare Other | Attending: Internal Medicine

## 2019-10-02 DIAGNOSIS — Z23 Encounter for immunization: Secondary | ICD-10-CM | POA: Insufficient documentation

## 2019-10-02 NOTE — Progress Notes (Signed)
   Covid-19 Vaccination Clinic  Name:  Megan Robbins    MRN: [id number] DOB: [birth year]  10/02/2019  [name] was observed post Covid-19 immunization for 15 minutes without incidence. She was provided with Vaccine Information Sheet and instruction to access the V-Safe system.   [name] was instructed to call 911 with any severe reactions post vaccine: Marland Kitchen Difficulty breathing  . Swelling of your face and throat  . A fast heartbeat  . A bad rash all over your body  . Dizziness and weakness    Immunizations Administered    Name Date Dose VIS Date Route   Pfizer COVID-19 Vaccine 10/02/2019  8:18 AM 0.3 mL 07/15/2019 Intramuscular   Manufacturer: [employer], [employer]   Lot: [id number]   NDC: [address]-2

## 2019-10-05 ENCOUNTER — Telehealth: Payer: Self-pay

## 2019-10-05 NOTE — Telephone Encounter (Signed)
New message    The patient recently had the COVID vaccine shot on 10/02/19.   C/o think it's maybe her allergies, nose running, sneezing once in a while. No hurting, no fever. Sinus drainage.   Wanted to discuss this with the nurse.

## 2019-10-06 NOTE — Telephone Encounter (Signed)
Called and left message for patient to return call to clinic to discuss.

## 2019-10-19 DIAGNOSIS — L508 Other urticaria: Secondary | ICD-10-CM | POA: Diagnosis not present

## 2019-10-24 ENCOUNTER — Other Ambulatory Visit: Payer: Self-pay | Admitting: Family

## 2019-10-24 DIAGNOSIS — I10 Essential (primary) hypertension: Secondary | ICD-10-CM

## 2019-10-26 ENCOUNTER — Other Ambulatory Visit: Payer: Self-pay

## 2019-10-26 ENCOUNTER — Ambulatory Visit: Payer: Medicare Other | Admitting: Podiatry

## 2019-10-26 VITALS — Temp 97.2°F

## 2019-10-26 DIAGNOSIS — M722 Plantar fascial fibromatosis: Secondary | ICD-10-CM | POA: Diagnosis not present

## 2019-10-26 DIAGNOSIS — L600 Ingrowing nail: Secondary | ICD-10-CM | POA: Diagnosis not present

## 2019-10-26 MED ORDER — GENTAMICIN SULFATE 0.1 % EX CREA: 1.0000 "application " | TOPICAL_CREAM | Freq: Two times a day (BID) | CUTANEOUS | 1 refills | Status: DC

## 2019-10-26 NOTE — Patient Instructions (Signed)

## 2019-10-30 NOTE — Progress Notes (Signed)
   Subjective: Patient presents today for follow up evaluation of an ingrown nail to the medial border of the right hallux and plantar fasciitis of the right foot. She reports continued pain from the nail. Touching the toe increases the pain. She has not done anything at home for treatment. Patient is here for further evaluation and treatment.   Past Medical History:  Diagnosis Date  . Anxiety   . Hypertension     Objective:  General: Well developed, nourished, in no acute distress, alert and oriented x3   Dermatology: Skin is warm, dry and supple bilateral. Medial border of the right hallux appears to be erythematous with evidence of an ingrowing nail. Pain on palpation noted to the border of the nail fold. The remaining nails appear unremarkable at this time. There are no open sores, lesions.  Vascular: Dorsalis Pedis artery and Posterior Tibial artery pedal pulses palpable. No lower extremity edema noted.   Neruologic: Grossly intact via light touch bilateral.  Musculoskeletal: Tenderness to palpation to the plantar aspect of the bilateral heels along the plantar fascia. Muscular strength within normal limits in all groups bilateral. Normal range of motion noted to all pedal and ankle joints.   Assesement: #1 Paronychia with ingrowing nail medial border right hallux #2 Incurvated nail #3 Plantar fasciitis bilateral   Plan of Care:  1. Patient evaluated.  2. Injection of 0.5 mLs Celestone Soluspan injected into the bilateral heels.  3. Discussed treatment alternatives and plan of care. Explained nail avulsion procedure and post procedure course to patient. 4. Patient opted for permanent partial nail avulsion of the medial border of the right great toe.  5. Prior to procedure, local anesthesia infiltration utilized using 3 ml of a 50:50 mixture of 2% plain lidocaine and 0.5% plain marcaine in a normal hallux block fashion and a betadine prep performed.  6. Partial permanent nail  avulsion with chemical matrixectomy performed using 3x30sec applications of phenol followed by alcohol flush.  7. Light dressing applied. 8. Patient afraid of surgery. She cancelled her previously scheduled surgery date.  9. Return to clinic in 2 weeks.    Felecia Shelling, DPM [facility]  Dr. Felecia Shelling, DPM    [address]                                        [place], [place] [address]                Office [phone]  Fax [phone]

## 2019-11-01 ENCOUNTER — Ambulatory Visit: Payer: Medicare Other | Attending: Internal Medicine

## 2019-11-01 DIAGNOSIS — Z23 Encounter for immunization: Secondary | ICD-10-CM

## 2019-11-01 NOTE — Progress Notes (Signed)
   Covid-19 Vaccination Clinic  Name:  Megan Robbins    MRN: [id number] DOB: 10/06/1935  11/01/2019  [name] was observed post Covid-19 immunization for 15 minutes without incident. She was provided with Vaccine Information Sheet and instruction to access the V-Safe system.   [name] was instructed to call 911 with any severe reactions post vaccine: Marland Kitchen Difficulty breathing  . Swelling of face and throat  . A fast heartbeat  . A bad rash all over body  . Dizziness and weakness   Immunizations Administered    Name Date Dose VIS Date Route   Pfizer COVID-19 Vaccine 11/01/2019  8:16 AM 0.3 mL 07/15/2019 Intramuscular   Manufacturer: [employer], [employer]   Lot: [id number]   NDC: [address]-0

## 2019-11-14 ENCOUNTER — Other Ambulatory Visit: Payer: Self-pay

## 2019-11-14 ENCOUNTER — Ambulatory Visit: Payer: Medicare Other | Admitting: Podiatry

## 2019-11-14 DIAGNOSIS — L600 Ingrowing nail: Secondary | ICD-10-CM

## 2019-11-14 DIAGNOSIS — M722 Plantar fascial fibromatosis: Secondary | ICD-10-CM

## 2019-11-20 NOTE — Progress Notes (Signed)
   Subjective: 84 y.o. female presents today status post permanent nail avulsion procedure of the medial border of the right great toe that was performed on 10/26/2019. She is also here for follow up evaluation of bilateral plantar fasciitis. She states the right great toe is doing well and denies any pain. She reports some soreness of the bilateral feet secondary to plantar fasciitis and believes it is because she has been on her feet a lot lately. She has been treating conservatively as she is afraid to have surgery. Patient is here for further evaluation and treatment.   Past Medical History:  Diagnosis Date  . Anxiety   . Hypertension     Objective: Skin is warm, dry and supple. Nail and respective nail fold appears to be healing appropriately. Open wound to the associated nail fold with a granular wound base and moderate amount of fibrotic tissue. Minimal drainage noted. Mild erythema around the periungual region likely due to phenol chemical matricectomy. Pain with palpation noted to the bilateral heels along the plantar fascia.   Assessment: #1 postop permanent partial nail avulsion medial border right great toe #2 open wound periungual nail fold of respective digit.  #3 chronic plantar fasciitis bilateral  #4 h/o EPF left - 1990s  Plan of care: #1 patient was evaluated  #2 debridement of open wound was performed to the periungual border of the respective toe using a currette. Antibiotic ointment and Band-Aid was applied. #3 Patient afraid of surgery. She cancelled her previously scheduled surgery date.  #4 patient is to return to clinic on a PRN basis.   Felecia Shelling, DPM [facility]  Dr. Felecia Shelling, DPM    [address]                                        [place], [place] [address]                Office [phone]  Fax [phone]

## 2019-12-16 ENCOUNTER — Other Ambulatory Visit: Payer: Self-pay | Admitting: Family

## 2019-12-16 MED ORDER — AMLODIPINE BESYLATE 2.5 MG PO TABS: 2.5000 mg | ORAL_TABLET | Freq: Every day | ORAL | 0 refills | Status: DC

## 2019-12-16 NOTE — Telephone Encounter (Signed)
New message:   1.Medication Requested: amLODipine (NORVASC) 2.5 MG tablet Irbesartan 75 mg 2. Pharmacy (Name, Street, [place]): [facility] [phone] - [place], [place] - [address] 3. On Med List: Yes/No  4. Last Visit with PCP: 04/08/19  5. Next visit date with PCP: None   Agent: Please be advised that RX refills may take up to 3 business days. We ask that you follow-up with your pharmacy.

## 2019-12-16 NOTE — Telephone Encounter (Signed)
Please disregard the Irbesartan 75  Mg pt didn't say that was for her husband. Please disregard.

## 2020-02-08 ENCOUNTER — Ambulatory Visit: Payer: Medicare Other | Admitting: Podiatry

## 2020-02-08 ENCOUNTER — Ambulatory Visit (INDEPENDENT_AMBULATORY_CARE_PROVIDER_SITE_OTHER): Payer: Medicare Other

## 2020-02-08 ENCOUNTER — Other Ambulatory Visit: Payer: Self-pay

## 2020-02-08 DIAGNOSIS — M79672 Pain in left foot: Secondary | ICD-10-CM | POA: Diagnosis not present

## 2020-02-08 DIAGNOSIS — M722 Plantar fascial fibromatosis: Secondary | ICD-10-CM | POA: Diagnosis not present

## 2020-02-08 DIAGNOSIS — M79671 Pain in right foot: Secondary | ICD-10-CM

## 2020-02-08 NOTE — Progress Notes (Signed)
   Subjective: 84 y.o. female presenting for follow-up evaluation and treatment of chronic plantar fasciitis to the bilateral feet that been going on for several years.  The patient does not want to have surgery.  She actually had plantar fascial surgery back in the 1990s which did not alleviate her pain.  She presents for follow-up treatment and evaluation   Past Medical History:  Diagnosis Date  . Anxiety   . Hypertension      Objective: Physical Exam General: The patient is alert and oriented x3 in no acute distress.  Dermatology: Skin is warm, dry and supple bilateral lower extremities. Negative for open lesions or macerations bilateral.   Vascular: Dorsalis Pedis and Posterior Tibial pulses palpable bilateral.  Capillary fill time is immediate to all digits.  Neurological: Epicritic and protective threshold intact bilateral.   Musculoskeletal: Tenderness to palpation to the plantar aspect of the bilateral heels along the plantar fascia. All other joints range of motion within normal limits bilateral. Strength 5/5 in all groups bilateral.   Assessment: 1. plantar fasciitis bilateral feet  Plan of Care:  1. Patient evaluated.    2. Injection of 0.5cc Celestone soluspan injected into the bilateral heels.  3.  Patient states that she would like to avoid surgery.  She canceled her previously scheduled surgery date. 4.  Continue wearing good supportive shoes 5.  Return to clinic as needed  Felecia Shelling, DPM [facility]  Dr. Felecia Shelling, DPM    [address] [place], [place] [address]                Office [phone]  Fax [phone]

## 2020-02-24 ENCOUNTER — Ambulatory Visit: Payer: Medicare Other | Admitting: Family

## 2020-04-06 ENCOUNTER — Encounter (HOSPITAL_COMMUNITY): Payer: Self-pay

## 2020-04-06 ENCOUNTER — Other Ambulatory Visit: Payer: Self-pay

## 2020-04-06 ENCOUNTER — Ambulatory Visit (HOSPITAL_COMMUNITY)
Admission: EM | Admit: 2020-04-06 | Discharge: 2020-04-06 | Disposition: A | Discharge status: Home / Self Care | Payer: Medicare Other | Attending: Internal Medicine | Admitting: Internal Medicine

## 2020-04-06 DIAGNOSIS — S39012A Strain of muscle, fascia and tendon of lower back, initial encounter: Secondary | ICD-10-CM

## 2020-04-06 MED ORDER — LIDOCAINE 4 % EX PTCH: 1.0000 | MEDICATED_PATCH | CUTANEOUS | 1 refills | Status: DC

## 2020-04-06 NOTE — ED Provider Notes (Signed)
[facility]    CSN: [id number] Arrival date & time: 04/06/20  1720      History   Chief Complaint Chief Complaint  Patient presents with  . Back Pain    HPI Megan Robbins is a 84 y.o. female.   HPI   Patient reports she carried a basket of clothes up the steps last week.  Has been having persistent low back pain since.  Pain described as hurt and aching.  Pain is worse when she straightens her back.  Has taken nothing for pain.  Reports similar pain several years ago.  States she was watching television and saw a commercial that discussed back pain that she thought she should be seen for it.  Reports she knows she should not carry what close but wanted to do her laundry.  Denies fever, loss of bowel or bladder function, dysuria, neck pain, abdominal pain,  prolonged use of steroids, immunosuppressive therapy, history of IV drug abuse.      Past Medical History:  Diagnosis Date  . Anxiety   . Hypertension     Patient Active Problem List   Diagnosis Date Noted  . Rash 06/23/2019  . Eustachian tube dysfunction, right 04/01/2018  . Impacted cerumen of left ear 04/01/2018  . Seasonal allergic rhinitis 04/01/2018  . Function kidney decreased 05/13/2017  . Compression fracture of L3 vertebra (HCC) 05/13/2017  . Mid back pain 05/08/2017  . Stress and adjustment reaction 05/08/2017  . Overweight 05/23/2015  . Essential hypertension 05/08/2015    Past Surgical History:  Procedure Laterality Date  . ABDOMINAL HYSTERECTOMY    . APPENDECTOMY    . FOOT SURGERY    . HEEL SPUR SURGERY    . TONSILLECTOMY      OB History   No obstetric history on file.      Home Medications    Prior to Admission medications   Medication Sig Start Date End Date Taking? Authorizing Provider  amLODipine (NORVASC) 2.5 MG tablet Take 1 tablet (2.5 mg total) by mouth daily. 12/16/19  Yes Olive Bass, FNP  atenolol (TENORMIN) 50 MG tablet TAKE 1 TABLET(50 MG) BY MOUTH  DAILY 10/25/19  Yes Olive Bass, FNP  fexofenadine (ALLEGRA) 180 MG tablet Take 1 tablet (180 mg total) by mouth daily. 06/23/19  Yes Corwin Levins, MD  fluticasone [facility]) 50 MCG/ACT nasal spray Place 1 spray into both nostrils daily. 04/14/18  Yes Kozlow, Alvira Philips, MD  gentamicin cream (GARAMYCIN) 0.1 % Apply 1 application topically 2 (two) times daily. 10/26/19  Yes Felecia Shelling, DPM  hydrALAZINE (APRESOLINE) 25 MG tablet Take 1 tablet (25 mg total) by mouth 2 (two) times daily. 04/08/19  Yes Olive Bass, FNP  cyclobenzaprine (FLEXERIL) 5 MG tablet Take 1 tablet (5 mg total) by mouth 3 (three) times daily as needed for muscle spasms. 09/20/18   Myrlene Broker, MD  Lidocaine (HM LIDOCAINE PATCH) 4 % PTCH Apply 1 each topically daily. Remove after 12 hours each day. 04/06/20   Sevyn Paredez, Seward Meth, DO  triamcinolone cream (KENALOG) 0.1 % Apply 1 application topically 2 (two) times daily. 06/23/19 06/22/20  Corwin Levins, MD    Family History Family History  Problem Relation Age of Onset  . Hyperlipidemia Mother   . Hypertension Mother   . Diabetes Daughter   . Diabetes Son     Social History Social History   Tobacco Use  . Smoking status: Never Smoker  . Smokeless tobacco: Never  Used  Vaping Use  . Vaping Use: Never used  Substance Use Topics  . Alcohol use: No  . Drug use: No     Allergies   Patient has no known allergies.   Review of Systems Review of Systems: See HPI   Physical Exam Triage Vital Signs ED Triage Vitals  Enc Vitals Group     BP      Pulse      Resp      Temp      Temp src      SpO2      Weight      Height      Head Circumference      Peak Flow      Pain Score      Pain Loc      Pain Edu?      Excl. in GC?    No data found.  Updated Vital Signs BP (!) 178/92 (BP Location: Right Arm)   Pulse 68   Temp 97.7 F (36.5 C) (Oral)   Resp 16   SpO2 98%   Visual Acuity Right Eye Distance:   Left Eye Distance:     Bilateral Distance:    Right Eye Near:   Left Eye Near:    Bilateral Near:     Physical Exam Vitals and nursing note reviewed.  Constitutional:      General: She is not in acute distress.    Appearance: Normal appearance. She is well-developed. She is not ill-appearing.  HENT:     Head: Normocephalic and atraumatic.     Nose: Nose normal.  Eyes:     Extraocular Movements: Extraocular movements intact.     Conjunctiva/sclera: Conjunctivae normal.  Cardiovascular:     Rate and Rhythm: Normal rate and regular rhythm.     Pulses: Normal pulses.  Pulmonary:     Effort: Pulmonary effort is normal. No respiratory distress.     Breath sounds: Normal breath sounds.  Abdominal:     Palpations: Abdomen is soft.     Tenderness: There is no abdominal tenderness.  Musculoskeletal:        General: Normal range of motion.     Cervical back: Normal range of motion and neck supple. No tenderness.     Comments: Good rotation, abduction and adduction of spine, midline tenderness around T10, hypertrophic paraspinal musculature, strength 5/5, gross sensation intact    Skin:    General: Skin is warm and dry.     Capillary Refill: Capillary refill takes less than 2 seconds.  Neurological:     General: No focal deficit present.     Mental Status: She is alert and oriented to person, place, and time.     Sensory: No sensory deficit.     Coordination: Coordination normal.  Psychiatric:        Mood and Affect: Mood normal.        Behavior: Behavior normal.      UC Treatments / Results  Labs (all labs ordered are listed, but only abnormal results are displayed) Labs Reviewed - No data to display  EKG   Radiology No results found.  Procedures Procedures (including critical care time)  Medications Ordered in UC Medications - No data to display  Initial Impression / Assessment and Plan / UC Course  I have reviewed the triage vital signs and the nursing notes.  Pertinent labs &  imaging results that were available during my care of the patient were reviewed by  me and considered in my medical decision making (see chart for details).     Lumbar strain  Discussed with patient gradually returning to normal activities, as tolerated. Pt to continue ordinary activities within the limits permitted by pain. Will prescribe Lidocaine patches and patient to use Tylenol for pain relief.  Counseled patient on red flag symptoms and when to seek immediate care. No red flags suggesting cauda equina syndrome or progressive major motor weakness. Patient to return if symptoms do not improve with conservative treatment.  Could consider imaging at that time.     Final Clinical Impressions(s) / UC Diagnoses   Final diagnoses:  Strain of lumbar region, initial encounter     Discharge Instructions     You were seen at the Urgent Care for back pain. Please pick up your prescriptions at your pharmacy. Be sure to stretch. Follow up with your PCP as needed. Do not lift anything heavier than a gallon of milk.   If you haven't already, sign up for My Chart to have easy access to your labs results, and communication with your primary care physician.  Dr. Katha Cabal    Stretches for Low Back Pain Back Flexion Stretch. Lying on the back, pull both knees to the chest while simultaneously flexing the head forward until a comfortable stretch is felt across the mid and low back.  Knee to Chest Stretch. Lie on the back with the knees bent and both heels on the floor, then place both hands behind one knee and pull it toward the chest, stretching the gluteus and piriformis muscles in the buttock.  Kneeling Lunge Stretch. Starting on both knees, move one leg forward so the foot is flat on the ground, keeping weight evenly distributed through both hips (rather than on one side or the other). Place both hands on the top of the thigh, and gently lean the body forward to feel a stretch in the front of  the other leg. This stretch affects the hip flexor muscles, which attach to the pelvis and can impact posture if too tight.  Piriformis Muscle Stretch. Lie on the back with knees bent and both heels on the floor. Cross one leg over the other, resting the ankle on the bent knee, then gently pull the bottom knee toward the chest until a stretch is felt in the buttock. Or, lying on the floor, cross one leg over the other and pull it forward over the body at the knee, keeping the other leg flat.    ED Prescriptions    Medication Sig Dispense Auth. Provider   Lidocaine (HM LIDOCAINE PATCH) 4 % PTCH Apply 1 each topically daily. Remove after 12 hours each day. 15 patch Genita Nilsson, DO     PDMP not reviewed this encounter.   Katha Cabal, DO 04/06/20 2032

## 2020-04-06 NOTE — Discharge Instructions (Addendum)
You were seen at the Urgent Care for back pain. Please pick up your prescriptions at your pharmacy. Be sure to stretch. Follow up with your PCP as needed. Do not lift anything heavier than a gallon of milk.   If you haven't already, sign up for My Chart to have easy access to your labs results, and communication with your primary care physician.  Watch for worsening symptoms such as an increasing weakness or loss of sensation legs, increasing pain and the loss of bladder or bowel function. Should any of these occur, go to the emergency department immediately.   Dr. Katha Cabal    Stretches for Low Back Pain Back Flexion Stretch. Lying on the back, pull both knees to the chest while simultaneously flexing the head forward until a comfortable stretch is felt across the mid and low back.  Knee to Chest Stretch. Lie on the back with the knees bent and both heels on the floor, then place both hands behind one knee and pull it toward the chest, stretching the gluteus and piriformis muscles in the buttock.  Kneeling Lunge Stretch. Starting on both knees, move one leg forward so the foot is flat on the ground, keeping weight evenly distributed through both hips (rather than on one side or the other). Place both hands on the top of the thigh, and gently lean the body forward to feel a stretch in the front of the other leg. This stretch affects the hip flexor muscles, which attach to the pelvis and can impact posture if too tight.  Piriformis Muscle Stretch. Lie on the back with knees bent and both heels on the floor. Cross one leg over the other, resting the ankle on the bent knee, then gently pull the bottom knee toward the chest until a stretch is felt in the buttock. Or, lying on the floor, cross one leg over the other and pull it forward over the body at the knee, keeping the other leg flat.

## 2020-04-06 NOTE — ED Triage Notes (Signed)
Pt presents today with mid to lower back pain that started within the last week. States she picked up a heavy basket of laundry to carry to her sisters house since her washing machine broke. Has been feeling a catching pain since then.

## 2020-04-07 ENCOUNTER — Other Ambulatory Visit: Payer: Self-pay

## 2020-04-07 ENCOUNTER — Ambulatory Visit (HOSPITAL_COMMUNITY)
Admission: EM | Admit: 2020-04-07 | Discharge: 2020-04-07 | Disposition: A | Discharge status: Home / Self Care | Payer: Medicare Other | Attending: Family Medicine | Admitting: Family Medicine

## 2020-04-07 DIAGNOSIS — M62830 Muscle spasm of back: Secondary | ICD-10-CM

## 2020-04-07 MED ORDER — TIZANIDINE HCL 4 MG PO TABS
4.0000 mg | ORAL_TABLET | Freq: Four times a day (QID) | ORAL | 0 refills | Status: DC | PRN
Start: 2020-04-07 — End: 2020-04-07

## 2020-04-07 MED ORDER — TIZANIDINE HCL 4 MG PO TABS
4.0000 mg | ORAL_TABLET | Freq: Four times a day (QID) | ORAL | 0 refills | Status: DC | PRN
Start: 2020-04-07 — End: 2021-02-22

## 2020-04-07 NOTE — ED Triage Notes (Signed)
Pt presents with c/o mid back pain. Pt states she did lift some wet clothes last week and thinks she may have strained something. She was seen here last night. She has been applying pain patches with some improvement. Pt is very emotional. She states she is afraid to even eat anything. She is very concerned she may have cancer. She is not sure what may be wrong with her.

## 2020-04-07 NOTE — Discharge Instructions (Signed)
Heat to the back Zanaflex for muscle spasm.  Stretch Follow up as needed for continued or worsening symptoms

## 2020-04-08 NOTE — ED Provider Notes (Signed)
[facility]    CSN: [id number] Arrival date & time: 04/07/20  1531      History   Chief Complaint Chief Complaint  Patient presents with  . Back Pain    HPI Megan Robbins is a 84 y.o. female.   Patient is 83-year female presents today with low back pain.  She was seen here day prior for similar symptoms and told lumbar strain.  She is to use lidocaine patch with some relief.  Patient here with concerns of some other significant underlying issue.  Mainly she she is worried.  No increase of pain, numbness, tingling or weakness.  No urinary symptoms to include dysuria, hematuria or urinary frequency.     Past Medical History:  Diagnosis Date  . Anxiety   . Hypertension     Patient Active Problem List   Diagnosis Date Noted  . Rash 06/23/2019  . Eustachian tube dysfunction, right 04/01/2018  . Impacted cerumen of left ear 04/01/2018  . Seasonal allergic rhinitis 04/01/2018  . Function kidney decreased 05/13/2017  . Compression fracture of L3 vertebra (HCC) 05/13/2017  . Mid back pain 05/08/2017  . Stress and adjustment reaction 05/08/2017  . Overweight 05/23/2015  . Essential hypertension 05/08/2015    Past Surgical History:  Procedure Laterality Date  . ABDOMINAL HYSTERECTOMY    . APPENDECTOMY    . FOOT SURGERY    . HEEL SPUR SURGERY    . TONSILLECTOMY      OB History   No obstetric history on file.      Home Medications    Prior to Admission medications   Medication Sig Start Date End Date Taking? Authorizing Provider  amLODipine (NORVASC) 2.5 MG tablet Take 1 tablet (2.5 mg total) by mouth daily. 12/16/19  Yes Olive Bass, FNP  atenolol (TENORMIN) 50 MG tablet TAKE 1 TABLET(50 MG) BY MOUTH DAILY 10/25/19  Yes Olive Bass, FNP  fexofenadine (ALLEGRA) 180 MG tablet Take 1 tablet (180 mg total) by mouth daily. 06/23/19  Yes Corwin Levins, MD  fluticasone [facility]) 50 MCG/ACT nasal spray Place 1 spray into both nostrils  daily. 04/14/18  Yes Kozlow, Alvira Philips, MD  gentamicin cream (GARAMYCIN) 0.1 % Apply 1 application topically 2 (two) times daily. 10/26/19  Yes Felecia Shelling, DPM  hydrALAZINE (APRESOLINE) 25 MG tablet Take 1 tablet (25 mg total) by mouth 2 (two) times daily. 04/08/19  Yes Olive Bass, FNP  Lidocaine (HM LIDOCAINE PATCH) 4 % PTCH Apply 1 each topically daily. Remove after 12 hours each day. 04/06/20  Yes Brimage, Vondra, DO  triamcinolone cream (KENALOG) 0.1 % Apply 1 application topically 2 (two) times daily. 06/23/19 06/22/20 Yes Corwin Levins, MD  tiZANidine (ZANAFLEX) 4 MG tablet Take 1 tablet (4 mg total) by mouth every 6 (six) hours as needed for muscle spasms. 04/07/20   Janace Aris, NP    Family History Family History  Problem Relation Age of Onset  . Hyperlipidemia Mother   . Hypertension Mother   . Diabetes Daughter   . Diabetes Son     Social History Social History   Tobacco Use  . Smoking status: Never Smoker  . Smokeless tobacco: Never Used  Vaping Use  . Vaping Use: Never used  Substance Use Topics  . Alcohol use: No  . Drug use: No     Allergies   Patient has no known allergies.   Review of Systems Review of Systems   Physical Exam Triage  Vital Signs ED Triage Vitals  Enc Vitals Group     BP 04/07/20 1834 (!) 183/108     Pulse Rate 04/07/20 1834 (!) 59     Resp --      Temp 04/07/20 1834 98.6 F (37 C)     Temp Source 04/07/20 1834 Oral     SpO2 04/07/20 1834 98 %     Weight 04/07/20 1833 170 lb (77.1 kg)     Height 04/07/20 1833 5\' 3"  (1.6 m)     Head Circumference --      Peak Flow --      Pain Score 04/07/20 1833 0     Pain Loc --      Pain Edu? --      Excl. in GC? --    No data found.  Updated Vital Signs BP (!) 183/108 (BP Location: Right Arm)   Pulse (!) 59   Temp 98.6 F (37 C) (Oral)   Ht 5\' 3"  (1.6 m)   Wt 170 lb (77.1 kg)   SpO2 98%   BMI 30.11 kg/m   Visual Acuity Right Eye Distance:   Left Eye Distance:     Bilateral Distance:    Right Eye Near:   Left Eye Near:    Bilateral Near:     Physical Exam Vitals and nursing note reviewed.  Constitutional:      General: She is not in acute distress.    Appearance: Normal appearance. She is not ill-appearing, toxic-appearing or diaphoretic.  HENT:     Head: Normocephalic.     Nose: Nose normal.  Eyes:     Conjunctiva/sclera: Conjunctivae normal.  Pulmonary:     Effort: Pulmonary effort is normal.  Musculoskeletal:        General: Normal range of motion.     Cervical back: Normal range of motion.       Back:     Comments: TTP and discolored area where patch was No rash.   Skin:    General: Skin is warm and dry.     Findings: No rash.  Neurological:     Mental Status: She is alert.  Psychiatric:        Mood and Affect: Mood normal.      UC Treatments / Results  Labs (all labs ordered are listed, but only abnormal results are displayed) Labs Reviewed - No data to display  EKG   Radiology No results found.  Procedures Procedures (including critical care time)  Medications Ordered in UC Medications - No data to display  Initial Impression / Assessment and Plan / UC Course  I have reviewed the triage vital signs and the nursing notes.  Pertinent labs & imaging results that were available during my care of the patient were reviewed by me and considered in my medical decision making (see chart for details).     Back pain/muscle spasm Nothing concerning on exam.  Will prescribe Zanaflex for muscle spasm.  She can continue the lidocaine patch and heat to the back.  Stretching Follow up as needed for continued or worsening symptoms  Final Clinical Impressions(s) / UC Diagnoses   Final diagnoses:  Muscle spasm of back     Discharge Instructions     Heat to the back Zanaflex for muscle spasm.  Stretch Follow up as needed for continued or worsening symptoms     ED Prescriptions    Medication Sig Dispense  Auth. Provider   tiZANidine (ZANAFLEX) 4 MG tablet  (  Status: Discontinued) Take 1 tablet (4 mg total) by mouth every 6 (six) hours as needed for muscle spasms. 30 tablet Valrie Jia A, NP   tiZANidine (ZANAFLEX) 4 MG tablet Take 1 tablet (4 mg total) by mouth every 6 (six) hours as needed for muscle spasms. 30 tablet Dahlia Byes A, NP     PDMP not reviewed this encounter.   Dahlia Byes A, NP 04/08/20 1029

## 2020-04-20 ENCOUNTER — Other Ambulatory Visit: Payer: Self-pay | Admitting: Internal Medicine

## 2020-04-20 ENCOUNTER — Telehealth (INDEPENDENT_AMBULATORY_CARE_PROVIDER_SITE_OTHER): Payer: Medicare Other | Admitting: Family

## 2020-04-20 ENCOUNTER — Other Ambulatory Visit: Payer: Self-pay

## 2020-04-20 DIAGNOSIS — R42 Dizziness and giddiness: Secondary | ICD-10-CM

## 2020-04-20 DIAGNOSIS — J019 Acute sinusitis, unspecified: Secondary | ICD-10-CM | POA: Diagnosis not present

## 2020-04-20 DIAGNOSIS — J302 Other seasonal allergic rhinitis: Secondary | ICD-10-CM | POA: Diagnosis not present

## 2020-04-20 MED ORDER — CEFDINIR 300 MG PO CAPS
300.0000 mg | ORAL_CAPSULE | Freq: Two times a day (BID) | ORAL | 0 refills | Status: DC
Start: 2020-04-20 — End: 2020-05-21

## 2020-04-20 MED ORDER — MECLIZINE HCL 25 MG PO TABS: 25.0000 mg | ORAL_TABLET | Freq: Three times a day (TID) | ORAL | 0 refills | Status: DC | PRN

## 2020-04-20 NOTE — Progress Notes (Signed)
Megan Robbins is a 84 y.o. female with the following history as recorded in [employer]:  Patient Active Problem List   Diagnosis Date Noted  . Rash 06/23/2019  . Eustachian tube dysfunction, right 04/01/2018  . Impacted cerumen of left ear 04/01/2018  . Seasonal allergic rhinitis 04/01/2018  . Function kidney decreased 05/13/2017  . Compression fracture of L3 vertebra (HCC) 05/13/2017  . Mid back pain 05/08/2017  . Stress and adjustment reaction 05/08/2017  . Overweight 05/23/2015  . Essential hypertension 05/08/2015    Current Outpatient Medications  Medication Sig Dispense Refill  . amLODipine (NORVASC) 2.5 MG tablet Take 1 tablet (2.5 mg total) by mouth daily. 90 tablet 0  . atenolol (TENORMIN) 50 MG tablet TAKE 1 TABLET(50 MG) BY MOUTH DAILY 90 tablet 1  . hydrALAZINE (APRESOLINE) 25 MG tablet Take 1 tablet (25 mg total) by mouth 2 (two) times daily. 180 tablet 1  . tiZANidine (ZANAFLEX) 4 MG tablet Take 1 tablet (4 mg total) by mouth every 6 (six) hours as needed for muscle spasms. 30 tablet 0  . fexofenadine (ALLEGRA) 180 MG tablet Take 1 tablet (180 mg total) by mouth daily. 90 tablet 3  . fluticasone (FLONASE) 50 MCG/ACT nasal spray Place 1 spray into both nostrils daily. 16 g 2  . Lidocaine (HM LIDOCAINE PATCH) 4 % PTCH Apply 1 each topically daily. Remove after 12 hours each day. 15 patch 1  . triamcinolone cream (KENALOG) 0.1 % Apply 1 application topically 2 (two) times daily. 30 g 0   No current facility-administered medications for this visit.    Allergies: Patient has no known allergies.  Past Medical History:  Diagnosis Date  . Anxiety   . Hypertension     Past Surgical History:  Procedure Laterality Date  . ABDOMINAL HYSTERECTOMY    . APPENDECTOMY    . FOOT SURGERY    . HEEL SPUR SURGERY    . TONSILLECTOMY      Family History  Problem Relation Age of Onset  . Hyperlipidemia Mother   . Hypertension Mother   . Diabetes Daughter   . Diabetes Son      Social History   Tobacco Use  . Smoking status: Never Smoker  . Smokeless tobacco: Never Used  Substance Use Topics  . Alcohol use: No    Subjective:   I connected with Megan Robbins on 04/20/20 at  1:20 PM EDT by a telephone call and verified that I am speaking with the correct person using two identifiers.   I discussed the limitations of evaluation and management by telemedicine and the availability of in person appointments. The patient expressed understanding and agreed to proceed. Provider in office/ patient is at home; provider and patient are only 2 people on telephone call.   Started 2-3 days ago with sneezing/ congestion; became concerned when she developed a "night sweat" last night but temperature has been normal all day; Has been having some dizziness- feels like the room is spinning around her;  does feel like her appetite is down but readily admits that she is very anxious about her symptoms; Notes that she ate egg/ drank water this morning and is actually feeling some better.  + fully vaccinated;    Objective:  There were no vitals filed for this visit.  Lungs: Respirations unlabored;  Neurologic: Alert and oriented; speech intact;  Assessment:  1. Vertigo   2. Seasonal allergic rhinitis, unspecified trigger   3. Acute sinusitis, recurrence not specified, unspecified location  Plan:  Rx for Omnicef 300 mg bid x 10 days; trial of Antivert 25 mg tid prn until vertigo resolves and then re-start Allegra; re-start her Flonase; follow-up worse, no better- strict ER precautions for upcoming weekend;   If congestion persists, will need to consider COVID test here but at this point, plan to keep in office appointment next Wednesday for blood pressure check.   Time spent 18 minutes  No follow-ups on file.  No orders of the defined types were placed in this encounter.   Requested Prescriptions    No prescriptions requested or ordered in this encounter

## 2020-04-21 ENCOUNTER — Other Ambulatory Visit: Payer: Self-pay | Admitting: Family

## 2020-04-21 DIAGNOSIS — I10 Essential (primary) hypertension: Secondary | ICD-10-CM

## 2020-04-25 ENCOUNTER — Ambulatory Visit: Payer: Medicare Other | Admitting: Family

## 2020-04-30 ENCOUNTER — Ambulatory Visit (INDEPENDENT_AMBULATORY_CARE_PROVIDER_SITE_OTHER): Payer: Medicare Other | Admitting: Podiatry

## 2020-04-30 ENCOUNTER — Other Ambulatory Visit: Payer: Self-pay

## 2020-04-30 DIAGNOSIS — M722 Plantar fascial fibromatosis: Secondary | ICD-10-CM

## 2020-04-30 DIAGNOSIS — M79672 Pain in left foot: Secondary | ICD-10-CM

## 2020-04-30 NOTE — Progress Notes (Signed)
   Subjective: 84 y.o. female presenting for follow-up evaluation and treatment of chronic plantar fasciitis to the bilateral feet that been going on for several years.  The patient does not want to have surgery.  She actually had plantar fascial surgery back in the 1990s which did not alleviate her pain.  She presents for follow-up treatment and evaluation   Past Medical History:  Diagnosis Date  . Anxiety   . Hypertension      Objective: Physical Exam General: The patient is alert and oriented x3 in no acute distress.  Dermatology: Skin is warm, dry and supple bilateral lower extremities. Negative for open lesions or macerations bilateral.   Vascular: Dorsalis Pedis and Posterior Tibial pulses palpable bilateral.  Capillary fill time is immediate to all digits.  Neurological: Epicritic and protective threshold intact bilateral.   Musculoskeletal: Tenderness to palpation to the plantar aspect of the bilateral heels along the plantar fascia. All other joints range of motion within normal limits bilateral. Strength 5/5 in all groups bilateral.   Assessment: 1. plantar fasciitis bilateral feet.  Left greater than right  Plan of Care:  1. Patient evaluated.    2. Injection of 0.5cc Celestone soluspan injected into the plantar fascia left.  3.  Patient states that she would like to avoid surgery.  She canceled her previously scheduled surgery date. 4.  Continue wearing good supportive shoes 5.  Return to clinic as needed  Felecia Shelling, DPM [facility]  Dr. Felecia Shelling, DPM    [address] [place], [place] [address]                Office [phone]  Fax [phone]

## 2020-05-02 ENCOUNTER — Ambulatory Visit (INDEPENDENT_AMBULATORY_CARE_PROVIDER_SITE_OTHER): Payer: Medicare Other | Admitting: Podiatry

## 2020-05-02 ENCOUNTER — Other Ambulatory Visit: Payer: Self-pay

## 2020-05-02 DIAGNOSIS — M722 Plantar fascial fibromatosis: Secondary | ICD-10-CM | POA: Diagnosis not present

## 2020-05-02 NOTE — Progress Notes (Signed)
   Subjective: 84 y.o. female presenting today for evaluation of right heel pain.  Patient was just recently here in the office where she received an injection to the left plantar fascial heel.  She says that the left foot is doing significantly well.  She should have received an injection in the right heel however she declined injections at that time.  She presents today to have an injection into her right plantar fascial heel.  Patient has a longstanding history of plantar fasciitis the bilateral feet however she does not want to proceed with surgery at this time   Past Medical History:  Diagnosis Date  . Anxiety   . Hypertension      Objective: Physical Exam General: The patient is alert and oriented x3 in no acute distress.  Dermatology: Skin is warm, dry and supple bilateral lower extremities. Negative for open lesions or macerations bilateral.   Vascular: Dorsalis Pedis and Posterior Tibial pulses palpable bilateral.  Capillary fill time is immediate to all digits.  Neurological: Epicritic and protective threshold intact bilateral.   Musculoskeletal: Tenderness to palpation to the plantar aspect of the right heel along the plantar fascia. All other joints range of motion within normal limits bilateral. Strength 5/5 in all groups bilateral.   Assessment: 1. Plantar fasciitis right 2.  Plantar fasciitis left-currently feeling well  Plan of Care:  1. Patient evaluated. 2. Injection of 0.5cc Celestone soluspan injected into the right plantar fascia  3.  Continue meloxicam as needed  4. Instructed patient regarding therapies and modalities at home to alleviate symptoms.  5. Return to clinic as needed   Felecia Shelling, DPM [facility]  Dr. Felecia Shelling, DPM    [address] [place], [place] [address]                Office [phone]  Fax [phone]

## 2020-05-04 ENCOUNTER — Ambulatory Visit: Payer: Medicare Other | Admitting: Family

## 2020-05-21 ENCOUNTER — Encounter: Payer: Self-pay | Admitting: Family

## 2020-05-21 ENCOUNTER — Ambulatory Visit (INDEPENDENT_AMBULATORY_CARE_PROVIDER_SITE_OTHER): Payer: Medicare Other | Admitting: Family

## 2020-05-21 ENCOUNTER — Other Ambulatory Visit: Payer: Self-pay

## 2020-05-21 VITALS — BP 150/88 | HR 66 | Temp 98.5°F | Ht 63.0 in | Wt 174.6 lb

## 2020-05-21 DIAGNOSIS — Z13220 Encounter for screening for lipoid disorders: Secondary | ICD-10-CM

## 2020-05-21 DIAGNOSIS — Z23 Encounter for immunization: Secondary | ICD-10-CM | POA: Diagnosis not present

## 2020-05-21 DIAGNOSIS — I10 Essential (primary) hypertension: Secondary | ICD-10-CM

## 2020-05-21 MED ORDER — HYDRALAZINE HCL 25 MG PO TABS: 25.0000 mg | ORAL_TABLET | Freq: Two times a day (BID) | ORAL | 1 refills | Status: DC

## 2020-05-21 NOTE — Progress Notes (Signed)
Megan Robbins is a 84 y.o. female with the following history as recorded in [employer]:  Patient Active Problem List   Diagnosis Date Noted  . Rash 06/23/2019  . Eustachian tube dysfunction, right 04/01/2018  . Impacted cerumen of left ear 04/01/2018  . Seasonal allergic rhinitis 04/01/2018  . Function kidney decreased 05/13/2017  . Compression fracture of L3 vertebra (HCC) 05/13/2017  . Mid back pain 05/08/2017  . Stress and adjustment reaction 05/08/2017  . Overweight 05/23/2015  . Essential hypertension 05/08/2015    Current Outpatient Medications  Medication Sig Dispense Refill  . amLODipine (NORVASC) 2.5 MG tablet TAKE 1 TABLET BY MOUTH EVERY DAY 30 tablet 2  . atenolol (TENORMIN) 50 MG tablet TAKE 1 TABLET(50 MG) BY MOUTH DAILY 90 tablet 1  . fexofenadine (ALLEGRA) 180 MG tablet Take 1 tablet (180 mg total) by mouth daily. 90 tablet 3  . fluticasone (FLONASE) 50 MCG/ACT nasal spray Place 1 spray into both nostrils daily. 16 g 2  . hydrALAZINE (APRESOLINE) 25 MG tablet Take 1 tablet (25 mg total) by mouth 2 (two) times daily. 180 tablet 1  . Lidocaine (HM LIDOCAINE PATCH) 4 % PTCH Apply 1 each topically daily. Remove after 12 hours each day. 15 patch 1  . meclizine (ANTIVERT) 25 MG tablet Take 1 tablet (25 mg total) by mouth 3 (three) times daily as needed for dizziness. 30 tablet 0  . tiZANidine (ZANAFLEX) 4 MG tablet Take 1 tablet (4 mg total) by mouth every 6 (six) hours as needed for muscle spasms. 30 tablet 0  . triamcinolone cream (KENALOG) 0.1 % Apply 1 application topically 2 (two) times daily. 30 g 0   No current facility-administered medications for this visit.    Allergies: Patient has no known allergies.  Past Medical History:  Diagnosis Date  . Anxiety   . Hypertension     Past Surgical History:  Procedure Laterality Date  . ABDOMINAL HYSTERECTOMY    . APPENDECTOMY    . FOOT SURGERY    . HEEL SPUR SURGERY    . TONSILLECTOMY      Family History  Problem  Relation Age of Onset  . Hyperlipidemia Mother   . Hypertension Mother   . Diabetes Daughter   . Diabetes Son     Social History   Tobacco Use  . Smoking status: Never Smoker  . Smokeless tobacco: Never Used  Substance Use Topics  . Alcohol use: No    Subjective:   Follow-up on hypertension; has known white coat hypertension; checks her pressure at home and blood pressure averages 130-140/ 80; Denies any chest pain, shortness of breath, blurred vision or headache Would like to get a flu shot today; agrees to get labs at later date- does not want to do today;   Objective:  Vitals:   05/21/20 1544  BP: (!) 150/88  Pulse: 66  Temp: 98.5 F (36.9 C)  TempSrc: Oral  Weight: 174 lb 9.6 oz (79.2 kg)  Height: 5\' 3"  (1.6 m)    General: Well developed, well nourished, in no acute distress  Head: Normocephalic and atraumatic  Lungs: Respirations unlabored; clear to auscultation bilaterally without wheeze, rales, rhonchi  CVS exam: normal rate and regular rhythm.  Abdomen: Soft; nontender; nondistended; normoactive bowel sounds; no masses or hepatosplenomegaly  Musculoskeletal: No deformities; no active joint inflammation  Extremities: No edema, cyanosis, clubbing  Vessels: Symmetric bilaterally  Neurologic: Alert and oriented; speech intact; face symmetrical; moves all extremities well; CNII-XII intact without focal  deficit  Assessment:  1. Essential hypertension   2. White coat syndrome with diagnosis of hypertension   3. Lipid screening     Plan:  Suspect controlled; she checks her pressure daily at home and it is normal; known history of white coat hypertension; she agrees to get labs updated at later date at [facility]; flu shot given;  Follow up in 1 year, sooner prn.  This visit occurred during the SARS-CoV-2 public health emergency.  Safety protocols were in place, including screening questions prior to the visit, additional usage of staff PPE, and extensive cleaning of exam  room while observing appropriate contact time as indicated for disinfecting solutions.     No follow-ups on file.  Orders Placed This Encounter  Procedures  . CBC with Differential/Platelet    Standing Status:   Future    Standing Expiration Date:   05/21/2021  . Comp Met (CMET)    Standing Status:   Future    Standing Expiration Date:   05/21/2021  . Lipid panel    Standing Status:   Future    Standing Expiration Date:   05/21/2021    Requested Prescriptions   Signed Prescriptions Disp Refills  . hydrALAZINE (APRESOLINE) 25 MG tablet 180 tablet 1    Sig: Take 1 tablet (25 mg total) by mouth 2 (two) times daily.

## 2020-07-14 ENCOUNTER — Other Ambulatory Visit: Payer: Self-pay | Admitting: Internal Medicine

## 2020-07-16 ENCOUNTER — Ambulatory Visit: Payer: Medicare Other | Admitting: Podiatry

## 2020-07-16 ENCOUNTER — Ambulatory Visit (INDEPENDENT_AMBULATORY_CARE_PROVIDER_SITE_OTHER): Payer: Medicare Other

## 2020-07-16 ENCOUNTER — Other Ambulatory Visit: Payer: Self-pay

## 2020-07-16 DIAGNOSIS — M722 Plantar fascial fibromatosis: Secondary | ICD-10-CM

## 2020-07-16 DIAGNOSIS — M79672 Pain in left foot: Secondary | ICD-10-CM

## 2020-07-16 DIAGNOSIS — M79671 Pain in right foot: Secondary | ICD-10-CM

## 2020-07-22 MED ORDER — BETAMETHASONE SOD PHOS & ACET 6 (3-3) MG/ML IJ SUSP
3.0000 mg | Freq: Once | INTRAMUSCULAR | Status: AC
  Administered 2020-07-22: 3 mg via INTRAMUSCULAR

## 2020-07-22 NOTE — Progress Notes (Signed)
   Subjective: 84 y.o. female presenting today for evaluation of bilateral heel pain.  Patient continues to experience significant pain and tenderness to the bilateral heels despite conservative treatments for over 1 year now.  She does have a history of a plantar fasciotomy approximately 10 years ago.  Conservative treatments have been unsuccessful in providing any sort of lasting satisfactory alleviation of her symptoms.  Past Medical History:  Diagnosis Date  . Anxiety   . Hypertension      Objective: Physical Exam General: The patient is alert and oriented x3 in no acute distress.  Dermatology: Skin is warm, dry and supple bilateral lower extremities. Negative for open lesions or macerations bilateral.   Vascular: Dorsalis Pedis and Posterior Tibial pulses palpable bilateral.  Capillary fill time is immediate to all digits.  Neurological: Epicritic and protective threshold intact bilateral.   Musculoskeletal: Tenderness to palpation to the plantar aspect of the bilateral heels along the plantar fascia. All other joints range of motion within normal limits bilateral. Strength 5/5 in all groups bilateral.   Assessment: 1. Plantar fasciitis bilateral 2.  History of plantar fasciotomy approximately 10 years ago  Plan of Care:  1. Patient evaluated. 2. Injection of 0.5cc Celestone soluspan injected into the bilateral plantar fascia  3.  Continue meloxicam as needed  4. Instructed patient regarding therapies and modalities at home to alleviate symptoms.  5.  Plantar fascial braces dispensed.  Wear daily  6.  Today we are going to order an MRI bilateral feet to determine the extent of the plantar fasciitis.   7.  Return to clinic after MRI to review the results and discuss further treatment options   Felecia Shelling, DPM [facility]  Dr. Felecia Shelling, DPM    [address] [place], [place] [address]                Office  [phone]  Fax [phone]

## 2020-08-16 ENCOUNTER — Other Ambulatory Visit: Payer: Self-pay

## 2020-08-16 ENCOUNTER — Ambulatory Visit
Admission: RE | Admit: 2020-08-16 | Discharge: 2020-08-16 | Disposition: A | Discharge status: Home / Self Care | Payer: Medicare Other | Source: Ambulatory Visit | Attending: Podiatry | Admitting: Podiatry

## 2020-08-16 ENCOUNTER — Other Ambulatory Visit: Payer: Medicare Other

## 2020-08-16 DIAGNOSIS — M722 Plantar fascial fibromatosis: Secondary | ICD-10-CM | POA: Diagnosis not present

## 2020-08-16 DIAGNOSIS — M19072 Primary osteoarthritis, left ankle and foot: Secondary | ICD-10-CM | POA: Diagnosis not present

## 2020-08-16 DIAGNOSIS — R600 Localized edema: Secondary | ICD-10-CM | POA: Diagnosis not present

## 2020-08-16 DIAGNOSIS — M79671 Pain in right foot: Secondary | ICD-10-CM

## 2020-08-16 DIAGNOSIS — M79672 Pain in left foot: Secondary | ICD-10-CM

## 2020-08-16 DIAGNOSIS — S96911A Strain of unspecified muscle and tendon at ankle and foot level, right foot, initial encounter: Secondary | ICD-10-CM | POA: Diagnosis not present

## 2020-08-16 DIAGNOSIS — S86811A Strain of other muscle(s) and tendon(s) at lower leg level, right leg, initial encounter: Secondary | ICD-10-CM | POA: Diagnosis not present

## 2020-09-19 ENCOUNTER — Ambulatory Visit: Payer: Medicare Other | Admitting: Podiatry

## 2020-09-19 ENCOUNTER — Other Ambulatory Visit: Payer: Self-pay

## 2020-09-19 DIAGNOSIS — M722 Plantar fascial fibromatosis: Secondary | ICD-10-CM

## 2020-10-06 NOTE — Progress Notes (Signed)
   Subjective: 85 y.o. female presenting today for evaluation of bilateral heel pain. Patient continues to experience chronic intermittent heel pain bilateral. She states that she did have a history of plantar fascial surgery over 10 years ago. She recently had an MRI performed and she presents to review MRI and discuss further treatment options. No new complaint.  Past Medical History:  Diagnosis Date  . Anxiety   . Hypertension      Objective: Physical Exam General: The patient is alert and oriented x3 in no acute distress.  Dermatology: Skin is warm, dry and supple bilateral lower extremities. Negative for open lesions or macerations bilateral.   Vascular: Dorsalis Pedis and Posterior Tibial pulses palpable bilateral.  Capillary fill time is immediate to all digits.  Neurological: Epicritic and protective threshold intact bilateral.   Musculoskeletal: Tenderness to palpation to the plantar aspect of the bilateral heels along the plantar fascia. All other joints range of motion within normal limits bilateral. Strength 5/5 in all groups bilateral.   MRI impression 08/16/2020 RT foot:  Moderate to moderately severe appearing plantar fasciitis of the medial cord. There is a small interstitial tear in the plantar fascia but no frank rupture. Mild reactive marrow edema in the calcaneus at the plantar fascia attachment noted.  MRI impression 08/16/2020 LT foot:  Mild plantar fasciitis. No plantar fascial tear.  Assessment: 1. Plantar fasciitis bilateral 2.  History of plantar fasciotomy approximately 10 years ago  Plan of Care:  1. Patient evaluated. MRI reviewed today 2. Patient declined any steroidal injections today 3. Recommend continuing good supportive shoes 4. Return to clinic as needed the patient is ready for surgical intervention  Felecia Shelling, DPM [facility]  Dr. Felecia Shelling, DPM    [address] [place], [place] [address]                Office [phone]  Fax [phone]

## 2020-10-08 ENCOUNTER — Ambulatory Visit: Payer: Medicare Other | Admitting: Podiatry

## 2020-10-08 ENCOUNTER — Other Ambulatory Visit: Payer: Self-pay

## 2020-10-08 DIAGNOSIS — M722 Plantar fascial fibromatosis: Secondary | ICD-10-CM | POA: Diagnosis not present

## 2020-10-08 MED ORDER — BETAMETHASONE SOD PHOS & ACET 6 (3-3) MG/ML IJ SUSP
12.0000 mg | Freq: Once | INTRAMUSCULAR | Status: DC
Start: 2020-10-08 — End: 2021-08-18

## 2020-10-08 NOTE — Progress Notes (Signed)
   Subjective: 85 y.o. female presenting today for evaluation of bilateral heel pain. Patient continues to experience chronic intermittent heel pain bilateral. She states that she did have a history of plantar fascial surgery over 10 years ago. She recently had an MRI performed and she presents to review MRI again and discuss further treatment options.  Today the patient is requesting a steroidal injection since it helps alleviate her symptoms temporarily  Past Medical History:  Diagnosis Date  . Anxiety   . Hypertension      Objective: Physical Exam General: The patient is alert and oriented x3 in no acute distress.  Dermatology: Skin is warm, dry and supple bilateral lower extremities. Negative for open lesions or macerations bilateral.   Vascular: Dorsalis Pedis and Posterior Tibial pulses palpable bilateral.  Capillary fill time is immediate to all digits.  Neurological: Epicritic and protective threshold intact bilateral.   Musculoskeletal: Tenderness to palpation to the plantar aspect of the bilateral heels along the plantar fascia. All other joints range of motion within normal limits bilateral. Strength 5/5 in all groups bilateral.   MRI impression 08/16/2020 RT foot:  Moderate to moderately severe appearing plantar fasciitis of the medial cord. There is a small interstitial tear in the plantar fascia but no frank rupture. Mild reactive marrow edema in the calcaneus at the plantar fascia attachment noted.  MRI impression 08/16/2020 LT foot:  Mild plantar fasciitis. No plantar fascial tear.  Assessment: 1. Plantar fasciitis bilateral 2.  History of plantar fasciotomy approximately 10 years ago  Plan of Care:  1. Patient evaluated. MRI reviewed again today 2.  Patient requested injections today.  Injection of 0.5 cc Celestone Soluspan injected in the bilateral plantar fascia 3. Recommend continuing good supportive shoes 4. Return to clinic as needed the patient is ready  for surgical intervention  Felecia Shelling, DPM [facility]  Dr. Felecia Shelling, DPM    [address] [place], [place] [address]                Office [phone]  Fax [phone]

## 2020-10-13 ENCOUNTER — Other Ambulatory Visit: Payer: Self-pay | Admitting: Family

## 2020-10-13 DIAGNOSIS — I10 Essential (primary) hypertension: Secondary | ICD-10-CM

## 2020-10-23 ENCOUNTER — Other Ambulatory Visit: Payer: Self-pay | Admitting: Family

## 2020-11-19 ENCOUNTER — Ambulatory Visit: Payer: Medicare Other | Admitting: Podiatry

## 2020-11-27 ENCOUNTER — Other Ambulatory Visit: Payer: Self-pay | Admitting: Family

## 2020-11-28 NOTE — Telephone Encounter (Signed)
I have called pt and relayed the message to the pt. She stated that she was unsure if she was going to follow Vernona Rieger to [facility] and that she already has plenty of refills. I have stated understanding and she is to still get that blood-work done. She stated that she is going to try.

## 2020-11-28 NOTE — Telephone Encounter (Signed)
Please remind her that she was supposed to get labs after her appointment in October; order is in place at [facility]- she can go at her convenience.

## 2020-12-03 ENCOUNTER — Other Ambulatory Visit: Payer: Self-pay

## 2020-12-03 ENCOUNTER — Ambulatory Visit: Payer: Medicare Other | Admitting: Podiatry

## 2020-12-03 DIAGNOSIS — M722 Plantar fascial fibromatosis: Secondary | ICD-10-CM | POA: Diagnosis not present

## 2020-12-03 MED ORDER — BETAMETHASONE SOD PHOS & ACET 6 (3-3) MG/ML IJ SUSP
3.0000 mg | Freq: Once | INTRAMUSCULAR | Status: AC
  Administered 2020-12-03: 3 mg via INTRA_ARTICULAR

## 2020-12-03 NOTE — Progress Notes (Signed)
   Subjective: 85 y.o. female presenting today for evaluation of bilateral heel pain. Patient continues to experience chronic intermittent heel pain bilateral. She states that she did have a history of plantar fascial surgery over 10 years ago. She recently had an MRI performed and she presents to review MRI again and discuss further treatment options.  Today the patient is requesting a steroidal injection since it helps alleviate her symptoms temporarily  Past Medical History:  Diagnosis Date  . Anxiety   . Hypertension      Objective: Physical Exam General: The patient is alert and oriented x3 in no acute distress.  Dermatology: Skin is warm, dry and supple bilateral lower extremities. Negative for open lesions or macerations bilateral.   Vascular: Dorsalis Pedis and Posterior Tibial pulses palpable bilateral.  Capillary fill time is immediate to all digits.  Neurological: Epicritic and protective threshold intact bilateral.   Musculoskeletal: Tenderness to palpation to the plantar aspect of the bilateral heels along the plantar fascia. All other joints range of motion within normal limits bilateral. Strength 5/5 in all groups bilateral.   MRI impression 08/16/2020 RT foot:  Moderate to moderately severe appearing plantar fasciitis of the medial cord. There is a small interstitial tear in the plantar fascia but no frank rupture. Mild reactive marrow edema in the calcaneus at the plantar fascia attachment noted.  MRI impression 08/16/2020 LT foot:  Mild plantar fasciitis. No plantar fascial tear.  Assessment: 1. Plantar fasciitis bilateral 2.  History of plantar fasciotomy approximately 10 years ago  Plan of Care:  1. Patient evaluated. MRI reviewed again today 2.  Patient requested injections today.  Injection of 0.5 cc Celestone Soluspan injected in the bilateral plantar fascia 3. Recommend continuing good supportive shoes 4. Return to clinic as needed the patient is ready  for surgical intervention.  Patient states that she has been very busy lately and unable to take time off postoperatively  Felecia Shelling, DPM [facility]  Dr. Felecia Shelling, DPM    [address] [place], [place] [address]                Office [phone]  Fax [phone]

## 2021-01-15 ENCOUNTER — Other Ambulatory Visit: Payer: Self-pay | Admitting: Family

## 2021-02-08 ENCOUNTER — Telehealth: Payer: Self-pay

## 2021-02-08 NOTE — Telephone Encounter (Signed)
Pt called in wanted to speak with Vernona Rieger. I told her that she had pt's currently and she would like to speak to you.

## 2021-02-08 NOTE — Telephone Encounter (Signed)
FYI  Pt stated that she may have strained her back last Friday. She states that she is having lower back pain and some loose stool. She does want to continue her care at [facility] but she will come out to have this back pain looked at on Tuesday. She is now scheduled for 3:20p and was advised that if the pain got worse over the weekend to go to the urgent care. She reports understand and will be at her appt on Tuesday in [facility].

## 2021-02-12 ENCOUNTER — Ambulatory Visit: Payer: Medicare Other | Admitting: Family

## 2021-02-13 ENCOUNTER — Other Ambulatory Visit: Payer: Self-pay

## 2021-02-13 ENCOUNTER — Ambulatory Visit (INDEPENDENT_AMBULATORY_CARE_PROVIDER_SITE_OTHER): Payer: Medicare Other | Admitting: Podiatry

## 2021-02-13 DIAGNOSIS — M722 Plantar fascial fibromatosis: Secondary | ICD-10-CM

## 2021-02-13 MED ORDER — BETAMETHASONE SOD PHOS & ACET 6 (3-3) MG/ML IJ SUSP: 3.0000 mg | Freq: Once | INTRAMUSCULAR | Status: DC

## 2021-02-13 NOTE — Progress Notes (Signed)
   Subjective: 85 y.o. female presenting today for evaluation of bilateral heel pain. Patient continues to experience chronic intermittent heel pain bilateral. She states that she did have a history of plantar fascial surgery over 10 years ago. She recently had an MRI performed and she presents to review MRI again and discuss further treatment options.  Today the patient is requesting a steroidal injection since it helps alleviate her symptoms temporarily  Past Medical History:  Diagnosis Date   Anxiety    Hypertension      Objective: Physical Exam General: The patient is alert and oriented x3 in no acute distress.  Dermatology: Skin is warm, dry and supple bilateral lower extremities. Negative for open lesions or macerations bilateral.   Vascular: Dorsalis Pedis and Posterior Tibial pulses palpable bilateral.  Capillary fill time is immediate to all digits.  Neurological: Epicritic and protective threshold intact bilateral.   Musculoskeletal: Tenderness to palpation to the plantar aspect of the bilateral heels along the plantar fascia. All other joints range of motion within normal limits bilateral. Strength 5/5 in all groups bilateral.   MRI impression 08/16/2020 RT foot:  Moderate to moderately severe appearing plantar fasciitis of the medial cord. There is a small interstitial tear in the plantar fascia but no frank rupture. Mild reactive marrow edema in the calcaneus at the plantar fascia attachment noted.  MRI impression 08/16/2020 LT foot:  Mild plantar fasciitis. No plantar fascial tear.  Assessment: 1. Plantar fasciitis bilateral 2.  History of plantar fasciotomy approximately 10 years ago  Plan of Care:  1. Patient evaluated. MRI reviewed again today 2.  Patient requested injections today.  Injection of 0.5 cc Celestone Soluspan injected in the bilateral plantar fascia 3. Recommend continuing good supportive shoes 4. Return to clinic as needed the patient is ready for  surgical intervention.  Patient states that she has been very busy lately and unable to take time off postoperatively  Felecia Shelling, DPM [facility]  Dr. Felecia Shelling, DPM    [address] [place], [place] [address]                Office [phone]  Fax [phone]

## 2021-02-22 ENCOUNTER — Other Ambulatory Visit: Payer: Self-pay

## 2021-02-22 ENCOUNTER — Ambulatory Visit (HOSPITAL_COMMUNITY)
Admission: EM | Admit: 2021-02-22 | Discharge: 2021-02-22 | Disposition: A | Discharge status: Home / Self Care | Payer: Medicare Other | Attending: Emergency Medicine | Admitting: Emergency Medicine

## 2021-02-22 ENCOUNTER — Encounter (HOSPITAL_COMMUNITY): Payer: Self-pay

## 2021-02-22 DIAGNOSIS — I10 Essential (primary) hypertension: Secondary | ICD-10-CM

## 2021-02-22 DIAGNOSIS — S39012A Strain of muscle, fascia and tendon of lower back, initial encounter: Secondary | ICD-10-CM | POA: Diagnosis not present

## 2021-02-22 HISTORY — DX: Strain of muscle, fascia and tendon of lower back, initial encounter: S39.012A

## 2021-02-22 MED ORDER — TIZANIDINE HCL 4 MG PO TABS: 4.0000 mg | ORAL_TABLET | Freq: Four times a day (QID) | ORAL | 0 refills | Status: AC | PRN

## 2021-02-22 NOTE — ED Notes (Signed)
Provider made aware of BP. Pt denies chest pain and headache.

## 2021-02-22 NOTE — ED Triage Notes (Signed)
Pt c/o lower back pain (soreness) that radiates down to legs.  Started: 2-3 weeks ago Interventions: none

## 2021-02-22 NOTE — ED Provider Notes (Signed)
[facility]    CSN: [id number] Arrival date & time: 02/22/21  1431      History   Chief Complaint Chief Complaint  Patient presents with   Back Pain    HPI Megan Robbins is a 85 y.o. female.   85 year old female presents to urgent care with chief complaint of chronic low back pain that flares up every now and then and goes down her legs.  Patient denies any saddle numbness or loss of bowel and bladder.  Patient also reports she is aware her blood pressure is elevated she has "whitecoat syndrome" and always becomes anxious and upset when she has to go to the doctor.  Patient has previously been seen for the same and was prescribed Zanaflex patient states she did not pick up the prescription requesting to try it again.  The history is provided by the patient. No language interpreter was used.   Past Medical History:  Diagnosis Date   Anxiety    Hypertension     Patient Active Problem List   Diagnosis Date Noted   Low back strain, initial encounter 02/22/2021   Rash 06/23/2019   Eustachian tube dysfunction, right 04/01/2018   Impacted cerumen of left ear 04/01/2018   Seasonal allergic rhinitis 04/01/2018   Function kidney decreased 05/13/2017   Compression fracture of L3 vertebra (HCC) 05/13/2017   Mid back pain 05/08/2017   Stress and adjustment reaction 05/08/2017   Overweight 05/23/2015   Essential hypertension 05/08/2015    Past Surgical History:  Procedure Laterality Date   ABDOMINAL HYSTERECTOMY     APPENDECTOMY     FOOT SURGERY     HEEL SPUR SURGERY     TONSILLECTOMY      OB History   No obstetric history on file.      Home Medications    Prior to Admission medications   Medication Sig Start Date End Date Taking? Authorizing Provider  amLODipine (NORVASC) 2.5 MG tablet TAKE 1 TABLET BY MOUTH EVERY DAY 01/15/21  Yes Olive Bass, FNP  atenolol (TENORMIN) 50 MG tablet TAKE 1 TABLET(50 MG) BY MOUTH DAILY 10/15/20  Yes Olive Bass, FNP  hydrALAZINE (APRESOLINE) 25 MG tablet TAKE 1 TABLET(25 MG) BY MOUTH TWICE DAILY 11/28/20  Yes Olive Bass, FNP  fexofenadine (ALLEGRA) 180 MG tablet Take 1 tablet (180 mg total) by mouth daily. 06/23/19   Corwin Levins, MD  fluticasone (FLONASE) 50 MCG/ACT nasal spray Place 1 spray into both nostrils daily. 04/14/18   Kozlow, Alvira Philips, MD  Lidocaine (HM LIDOCAINE PATCH) 4 % PTCH Apply 1 each topically daily. Remove after 12 hours each day. 04/06/20   Katha Cabal, DO  meclizine (ANTIVERT) 25 MG tablet Take 1 tablet (25 mg total) by mouth 3 (three) times daily as needed for dizziness. 04/20/20   Olive Bass, FNP  tiZANidine (ZANAFLEX) 4 MG tablet Take 1 tablet (4 mg total) by mouth every 6 (six) hours as needed for up to 3 days for muscle spasms. 02/22/21 02/25/21  Aylani Spurlock, Para March, NP    Family History Family History  Problem Relation Age of Onset   Hyperlipidemia Mother    Hypertension Mother    Diabetes Daughter    Diabetes Son     Social History Social History   Tobacco Use   Smoking status: Never   Smokeless tobacco: Never  Vaping Use   Vaping Use: Never used  Substance Use Topics   Alcohol use: No   Drug use:  No     Allergies   Patient has no known allergies.   Review of Systems Review of Systems  Genitourinary:  Negative for dysuria.  Musculoskeletal:  Positive for back pain and myalgias.  All other systems reviewed and are negative.   Physical Exam Triage Vital Signs ED Triage Vitals  Enc Vitals Group     BP 02/22/21 1500 (!) 171/103     Pulse Rate 02/22/21 1458 (!) 58     Resp 02/22/21 1458 18     Temp 02/22/21 1458 97.9 F (36.6 C)     Temp Source 02/22/21 1458 Oral     SpO2 02/22/21 1458 98 %     Weight --      Height --      Head Circumference --      Peak Flow --      Pain Score 02/22/21 1457 5     Pain Loc --      Pain Edu? --      Excl. in GC? --    No data found.  Updated Vital Signs BP (!) 171/103    Pulse (!) 58   Temp 97.9 F (36.6 C) (Oral)   Resp 18   SpO2 98%   Visual Acuity Right Eye Distance:   Left Eye Distance:   Bilateral Distance:    Right Eye Near:   Left Eye Near:    Bilateral Near:     Physical Exam Vitals and nursing note reviewed.  Musculoskeletal:     Lumbar back: Spasms present. Negative right straight leg raise test and negative left straight leg raise test.     Comments: Neurovascular intact equal dorsiflexion extension bilaterally     UC Treatments / Results  Labs (all labs ordered are listed, but only abnormal results are displayed) Labs Reviewed - No data to display  EKG   Radiology No results found.  Procedures Procedures (including critical care time)  Medications Ordered in UC Medications - No data to display  Initial Impression / Assessment and Plan / UC Course  I have reviewed the triage vital signs and the nursing notes.  Pertinent labs & imaging results that were available during my care of the patient were reviewed by me and considered in my medical decision making (see chart for details).     Ddx: Low back pain, muscle spasm, whitecoat syndrome, history of hypertension, history of low back pain Final Clinical Impressions(s) / UC Diagnoses   Final diagnoses:  White coat syndrome with diagnosis of hypertension  Low back strain, initial encounter     Discharge Instructions      Rest, push fluids, take muscle relaxer as provided.  May use over-the-counter lidocaine patch for discomfort, go immediately to ER if you develop saddle numbness loss of bowel and bladder.  Follow-up with PCP in 3 days.     ED Prescriptions     Medication Sig Dispense Auth. Provider   tiZANidine (ZANAFLEX) 4 MG tablet Take 1 tablet (4 mg total) by mouth every 6 (six) hours as needed for up to 3 days for muscle spasms. 12 tablet Armin Yerger, Para March, NP      PDMP not reviewed this encounter.   Clancy Gourd, NP 02/22/21 2038

## 2021-02-22 NOTE — Discharge Instructions (Addendum)
Rest, push fluids, take muscle relaxer as provided.  May use over-the-counter lidocaine patch for discomfort, go immediately to ER if you develop saddle numbness loss of bowel and bladder.  Follow-up with PCP in 3 days.

## 2021-03-08 ENCOUNTER — Telehealth: Payer: Self-pay | Admitting: Family

## 2021-03-08 NOTE — Telephone Encounter (Signed)
Slight cough and draiage. She said she did take Allegra and it make her lil  better, But she wants to know if some antibiotics and be called in. She said to remind Vernona Rieger that she is a scary and hopes its not Covid. I didn't recommend she take an at home test

## 2021-03-08 NOTE — Telephone Encounter (Signed)
I have called the pt and we have discussed that she will need to be seen. She no-showed the appt in 02/2021 with Vernona Rieger and stated that she will take the appointment here on Monday. I have talked to pt and she will do a home covid test before coming to her appointment on [place] with Dr. Laury Axon.

## 2021-03-11 ENCOUNTER — Ambulatory Visit (INDEPENDENT_AMBULATORY_CARE_PROVIDER_SITE_OTHER): Payer: Medicare Other | Admitting: Family Medicine

## 2021-03-11 ENCOUNTER — Telehealth: Payer: Self-pay

## 2021-03-11 ENCOUNTER — Other Ambulatory Visit: Payer: Self-pay

## 2021-03-11 ENCOUNTER — Encounter: Payer: Self-pay | Admitting: Family Medicine

## 2021-03-11 ENCOUNTER — Ambulatory Visit: Payer: Self-pay

## 2021-03-11 ENCOUNTER — Other Ambulatory Visit: Payer: Self-pay | Admitting: Family Medicine

## 2021-03-11 VITALS — Temp 100.0°F | Wt 176.0 lb

## 2021-03-11 DIAGNOSIS — J40 Bronchitis, not specified as acute or chronic: Secondary | ICD-10-CM | POA: Diagnosis not present

## 2021-03-11 DIAGNOSIS — U071 COVID-19: Secondary | ICD-10-CM

## 2021-03-11 HISTORY — DX: Bronchitis, not specified as acute or chronic: J40

## 2021-03-11 MED ORDER — AZITHROMYCIN 250 MG PO TABS: ORAL_TABLET | ORAL | 0 refills | Status: DC

## 2021-03-11 MED ORDER — PROMETHAZINE-DM 6.25-15 MG/5ML PO SYRP: 5.0000 mL | ORAL_SOLUTION | Freq: Four times a day (QID) | ORAL | 0 refills | Status: DC | PRN

## 2021-03-11 MED ORDER — MOLNUPIRAVIR EUA 200MG CAPSULE: 4.0000 | ORAL_CAPSULE | Freq: Two times a day (BID) | ORAL | 0 refills | Status: AC

## 2021-03-11 NOTE — Patient Instructions (Signed)
COVID-19: Quarantine and Isolation Quarantine If you were exposed Quarantine and stay away from others when you have been in close contact with someone whohas COVID-19. Isolate If you are sick or test positive Isolate when you are sick or when you have COVID-19, even if you don't have symptoms. When to stay home Calculating quarantine The date of your exposure is considered day 0. Day 1 is the first full day after your last contact with a person who has had COVID-19. Stay home and away from other people for at least 5 days. Learn why CDC updated guidance for the general public. IF YOU were exposed to COVID-19 and are NOT up-to-date IF YOU were exposed to COVID-19 and are NOT on COVID-19 vaccinations Quarantine for at least 5 days Stay home Stay home and quarantine for at least 5 full days. Wear a well-fitted mask if you must be around others in your home. Do not travel. Get tested Even if you don't develop symptoms, get tested at least 5 days after you last had close contact with someone with COVID-19. After quarantine Watch for symptoms Watch for symptoms until 10 days after you last had close contact with someone with COVID-19. Avoid travel It is best to avoid travel until a full 10 days after you last had close contact with someone with COVID-19. If you develop symptoms Isolate immediately and get tested. Continue to stay home until you know the results. Wear a well-fitted mask around others. Take precautions until day 10 Wear a mask Wear a well-fitted mask for 10 full days any time you are around others inside your home or in public. Do not go to places where you are unable to wear a mask. If you must travel during days 6-10, take precautions. Avoid being around people who are at high risk IF YOU were exposed to COVID-19 and are up-to-date IF YOU were exposed to COVID-19 and are on COVID-19 vaccinations No quarantine You do not need to stay home unless you develop  symptoms. Get tested Even if you don't develop symptoms, get tested at least 5 days after you last had close contact with someone with COVID-19. Watch for symptoms Watch for symptoms until 10 days after you last had close contact with someone with COVID-19. If you develop symptoms Isolate immediately and get tested. Continue to stay home until you know the results. Wear a well-fitted mask around others. Take precautions until day 10 Wear a mask Wear a well-fitted mask for 10 full days any time you are around others inside your home or in public. Do not go to places where you are unable to wear a mask. Take precautions if traveling Avoid being around people who are at high risk IF YOU were exposed to COVID-19 and had confirmed COVID-19 within the past 90 days (you tested positive using a viral test) No quarantine You do not need to stay home unless you develop symptoms. Watch for symptoms Watch for symptoms until 10 days after you last had close contact with someone with COVID-19. If you develop symptoms Isolate immediately and get tested. Continue to stay home until you know the results. Wear a well-fitted mask around others. Take precautions until day 10 Wear a mask Wear a well-fitted mask for 10 full days any time you are around others inside your home or in public. Do not go to places where you are unable to wear a mask. Take precautions if traveling Avoid being around people who are at high risk Calculating isolation   Day 0 is your first day of symptoms or a positive viral test. Day 1 is the first full day after your symptoms developed or your test specimen was collected. If you have COVID-19 or have symptoms, isolate for at least 5 days. IF YOU tested positive for COVID-19 or have symptoms, regardless of vaccination status Stay home for at least 5 days Stay home for 5 days and isolate from others in your home. Wear a well-fitted mask if you must be around others in your home. Do not  travel. Ending isolation if you had symptoms End isolation after 5 full days if you are fever-free for 24 hours (without the use of fever-reducing medication) and your symptoms are improving. Ending isolation if you did NOT have symptoms End isolation after at least 5 full days after your positive test. If you were severely ill with COVID-19 or are immunocompromised You should isolate for at least 10 days. Consult your doctor before ending isolation. Take precautions until day 10 Wear a mask Wear a well-fitted mask for 10 full days any time you are around others inside your home or in public. Do not go to places where you are unable to wear a mask. Do not travel Do not travel until a full 10 days after your symptoms started or the date your positive test was taken if you had no symptoms. Avoid being around people who are at high risk Definitions Exposure Contact with someone infected with SARS-CoV-2, the virus that causes COVID-19,in a way that increases the likelihood of getting infected with the virus. Close contact A close contact is someone who was less than 6 feet away from an infected person (laboratory-confirmed or a clinical diagnosis) for a cumulative total of 15 minutes or more over a 24-hour period. For example, three individual 5-minute exposures for a total of 15 minutes. People who are exposed to someone with COVID-19 after they completed at least 5 days of isolation are notconsidered close contacts. Quarantine Quarantine is a strategy used to prevent transmission of COVID-19 by keeping people who have been in close contact with someone with COVID-19 apart from others. Who does not need to quarantine? If you had close contact with someone with COVID-19 and you are in one of the following groups, you do not need to quarantine. You are up to date with your COVID-19 vaccines. You had confirmed COVID-19 within the last 90 days (meaning you tested positive using a viral test). You  should wear a well-fitting mask around others for 10 days from the date of your last close contact with someone with COVID-19 (the date of last close contact is considered day 0). Get tested at least 5 days after you last had close contact with someone with COVID-19. If you test positive or develop COVID-19 symptoms, isolate from other people and follow recommendations in the Isolation section below. If you tested positive for COVID-19 with a viral test within the previous 90 days and subsequently recovered and remain without COVID-19 symptoms, you do not need to quarantine or get tested after close contact. You should wear a well-fitting mask around others for 10 days from the date of your last close contact withsomeone with COVID-19 (the date of last close contact is considered day 0). Who should quarantine? If you come into close contact with someone with COVID-19, you should quarantine if you are not up to date on COVID-19 vaccines. This includes people who are not vaccinated. What to do for quarantine Stay home and away   from other people for at least 5 days (day 0 through day 5) after your last contact with a person who has COVID-19. The date of your exposure is considered day 0. Wear a well-fitting mask when around others at home, if possible. For 10 days after your last close contact with someone with COVID-19, watch for fever (100.4F or greater), cough, shortness of breath, or other COVID-19 symptoms. If you develop symptoms, get tested immediately and isolate until you receive your test results. If you test positive, follow isolation recommendations. If you do not develop symptoms, get tested at least 5 days after you last had close contact with someone with COVID-19. If you test negative, you can leave your home, but continue to wear a well-fitting mask when around others at home and in public until 10 days after your last close contact with someone with COVID-19. If you test positive, you should  isolate for at least 5 days from the date of your positive test (if you do not have symptoms). If you do develop COVID-19 symptoms, isolate for at least 5 days from the date your symptoms began (the date the symptoms started is day 0). Follow recommendations in the isolation section below. If you are unable to get a test 5 days after last close contact with someone with COVID-19, you can leave your home after day 5 if you have been without COVID-19 symptoms throughout the 5-day period. Wear a well-fitting mask for 10 days after your date of last close contact when around others at home and in public. Avoid people who are immunocompromised or at high risk for severe disease, and nursing homes and other high-risk settings, until after at least 10 days. If possible, stay away from people you live with, especially people who are at higher risk for getting very sick from COVID-19, as well as others outside your home throughout the full 10 days after your last close contact with someone with COVID-19. If you are unable to quarantine, you should wear a well-fitting mask for 10 days when around others at home and in public. If you are unable to wear a mask when around others, you should continue to quarantine for 10 days. Avoid people who are immunocompromised or at high risk for severe disease, and nursing homes and other high-risk settings, until after at least 10 days. See additional information about travel. Do not go to places where you are unable to wear a mask, such as restaurants and some gyms, and avoid eating around others at home and at work until after 10 days after your last close contact with someone with COVID-19. After quarantine Watch for symptoms until 10 days after your last close contact with someone with COVID-19. If you have symptoms, isolate immediately and get tested. Quarantine in high-risk congregate settings In certain congregate settings that have high risk of secondary transmission  (such as correctional and detention facilities, homeless shelters, or cruise ships), CDC recommends a 10-day quarantine for residents, regardless of vaccination and booster status. During periods of critical staffing shortages, facilities may consider shortening the quarantine period for staff to ensure continuity of operations. Decisions to shorten quarantine in these settings should be made in consultation with state, local, tribal, or territorial health departments and should take into consideration the context and characteristics of the facility. CDC's setting-specific guidance provides additional recommendations for these settings. Isolation Isolation is used to separate people with confirmed or suspected COVID-19 from those without COVID-19. People who are in isolation should stay   home until it's safe for them to be around others. At home, anyone sick or infected should separate from others, or wear a well-fitting mask when they need to be around others. People in isolation should stay in a specific "sick room" or area and use a separate bathroom if available. Everyone who has presumed or confirmed COVID-19 should stay home and isolate from other people for at least 5 full days (day 0 is the first day of symptoms or the date of the day of the positive viral test for asymptomatic persons). They should wear a mask when around others at home and in public for an additional 5 days. People who are confirmed to have COVID-19 or are showing symptoms of COVID-19 need to isolate regardless of their vaccination status. This includes: People who have a positive viral test for COVID-19, regardless of whether or not they have symptoms. People with symptoms of COVID-19, including people who are awaiting test results or have not been tested. People with symptoms should isolate even if they do not know if they have been in close contact with someone with COVID-19. What to do for isolation Monitor your symptoms. If you  have an emergency warning sign (including trouble breathing), seek emergency medical care immediately. Stay in a separate room from other household members, if possible. Use a separate bathroom, if possible. Take steps to improve ventilation at home, if possible. Avoid contact with other members of the household and pets. Don't share personal household items, like cups, towels, and utensils. Wear a well-fitting mask when you need to be around other people. Learn more about what to do if you are sick and how to notify your contacts. Ending isolation for people who had COVID-19 and had symptoms If you had COVID-19 and had symptoms, isolate for at least 5 days. To calculate your 5-day isolation period, day 0 is your first day of symptoms. Day 1 is the first full day after your symptoms developed. You can leave isolation after 5 full days. You can end isolation after 5 full days if you are fever-free for 24 hours without the use of fever-reducing medication and your other symptoms have improved (Loss of taste and smell may persist for weeks or months after recovery and need not delay the end of isolation). You should continue to wear a well-fitting mask around others at home and in public for 5 additional days (day 6 through day 10) after the end of your 5-day isolation period. If you are unable to wear a mask when around others, you should continue to isolate for a full 10 days. Avoid people who are immunocompromised or at high risk for severe disease, and nursing homes and other high-risk settings, until after at least 10 days. If you continue to have fever or your other symptoms have not improved after 5 days of isolation, you should wait to end your isolation until you are fever-free for 24 hours without the use of fever-reducing medication and your other symptoms have improved. Continue to wear a well-fitting mask. Contact your healthcare provider if you have questions. See additional information about  travel. Do not go to places where you are unable to wear a mask, such as restaurants and some gyms, and avoid eating around others at home and at work until a full 10 days after your first day of symptoms. If an individual has access to a test and wants to test, the best approach is to use an antigen test1 towards the end of   the 5-day isolation period. Collect the test sample only if you are fever-free for 24 hours without the use of fever-reducing medication and your other symptoms have improved (loss of taste and smell may persist for weeks or months after recovery and need not delay the end of isolation). If your test result is positive, you should continue to isolate until day 10. If your test result is negative, you can end isolation, but continue to wear a well-fitting mask around others at home and in public until day 10. Follow additional recommendations for masking and avoiding travel as described above. 1As noted in the labeling for authorized over-the counter antigen tests: Negative results should be treated as presumptive. Negative results do not rule out SARS-CoV-2 infection and should not be used as the sole basis for treatment or patient management decisions, including infection control decisions. To improve results, antigen tests should be used twice over a three-day period with at least 24 hours and no more than 48 hours between tests. Note that these recommendations on ending isolation do not apply to people with moderate or severe COVID-19 or with weakened immune systems (immunocompromised). See section below for recommendations for when toend isolation for these groups. Ending isolation for people who tested positive for COVID-19 but had no symptoms If you test positive for COVID-19 and never develop symptoms, isolate for at least 5 days. Day 0 is the day of your positive viral test (based on the date you were tested) and day 1 is the first full day after the specimen was collected for your  positive test. You can leave isolation after 5 full days. If you continue to have no symptoms, you can end isolation after at least 5 days. You should continue to wear a well-fitting mask around others at home and in public until day 10 (day 6 through day 10). If you are unable to wear a mask when around others, you should continue to isolate for 10 days. Avoid people who are immunocompromised or at high risk for severe disease, and nursing homes and other high-risk settings, until after at least 10 days. If you develop symptoms after testing positive, your 5-day isolation period should start over. Day 0 is your first day of symptoms. Follow the recommendations above for ending isolation for people who had COVID-19 and had symptoms. See additional information about travel. Do not go to places where you are unable to wear a mask, such as restaurants and some gyms, and avoid eating around others at home and at work until 10 days after the day of your positive test. If an individual has access to a test and wants to test, the best approach is to use an antigen test1 towards the end of the 5-day isolation period. If your test result is positive, you should continue to isolate until day 10. If your test result is negative, you can end isolation, but continue to wear a well-fitting mask around others at home and in public until day 10. Follow additionalrecommendations for masking and avoiding travel as described above. 1As noted in the labeling for authorized over-the counter antigen tests: Negative results should be treated as presumptive. Negative results do not rule out SARS-CoV-2 infection and should not be used as the sole basis for treatment or patient management decisions, including infection control decisions. To improve results, antigen tests should be used twice over a three-day period with at least 24 hours and no more than 48 hours between tests. Ending isolation for people who were   severely ill with  COVID-19 or have a weakened immune system (immunocompromised) People who are severely ill with COVID-19 (including those who were hospitalized or required intensive care or ventilation support) and people with compromised immune systems might need to isolate at home longer. They may also require testing with a viral test to determine when they can be around others. CDC recommends an isolation period of at least 10 and up to 20 days for people who were severely ill with COVID-19 and for people with weakened immune systems. Consult with your healthcare provider about when you can resume being aroundother people. People who are immunocompromised should talk to their healthcare provider about the potential for reduced immune responses to COVID-19 vaccines and the need to continue to follow current prevention measures (including wearing a well-fitting mask, staying 6 feet apart from others they don't live with, and avoiding crowds and poorly ventilated indoor spaces) to protect themselves against COVID-19 until advised otherwise by their healthcare provider. Close contacts of immunocompromised people--including household members--should also be encouraged to receive all recommended COVID-19 vaccine doses to help protect these people. Isolation in high-risk congregate settings In certain high-risk congregate settings that have high risk of secondary transmission and where it is not feasible to cohort people (such as correctional and detention facilities, homeless shelters, and cruise ships), CDC recommends a 10-day isolation period for residents. During periods of critical staffing shortages, facilities may consider shortening the isolation period for staff to ensure continuity of operations. Decisions to shorten isolation in these settings should be made in consultation with state, local, tribal, or territorial health departments and should take into consideration the context and characteristics of the facility.  CDC's setting-specific guidance provides additional recommendations for these settings. This CDC guidance is meant to supplement--not replace--any federal, state, local,territorial, or tribal health and safety laws, rules, and regulations. Recommendations for specific settings These recommendations do not apply to healthcare professionals. For guidance specific to these settings, see Healthcare professionals: Interim Guidance for Managing Healthcare Personnel with SARS-CoV-2 Infection or Exposure to SARS-CoV-2 Patients, residents, and visitors to healthcare settings: Interim Infection Prevention and Control Recommendations for Healthcare Personnel During the Coronavirus Disease 2019 (COVID-19) Pandemic Additional setting-specific guidance and recommendations are available. These recommendations on quarantine and isolation do apply to K-12 School settings. Additional guidance is available here: Overview of COVID-19 Quarantine for K-12 Schools Travelers: Travel information and recommendations Congregate facilities and other settings: guidance pages for community, work, and school settings Ongoing COVID-19 exposure FAQs I live with someone with COVID-19, but I cannot be separated from them. How do we manage quarantine in this situation? It is very important for people with COVID-19 to remain apart from other people, if possible, even if they are living together. If separation of the person with COVID-19 from others that they live with is not possible, the other people that they live with will have ongoing exposure, meaning they will be repeatedly exposed until that person is no longer able to spread the virus to other people. In this situation, there are precautions you can take to limit the spread of COVID-19: The person with COVID-19 and everyone they live with should wear a well-fitting mask inside the home. If possible, one person should care for the person with COVID-19 to limit the number of people  who are in close contact with the infected person. Take steps to protect yourself and others to reduce transmission in the home: Quarantine if you are not up to date with your COVID-19   vaccines. Isolate if you are sick or tested positive for COVID-19, even if you don't have symptoms. Learn more about the public health recommendations for testing, mask use and quarantine of close contacts, like yourself, who have ongoing exposure. These recommendations differ depending on your vaccination status. What should I do if I have ongoing exposure to COVID-19 from someone I live with? Recommendations for this situation depend on your vaccination status: If you are not up to date on COVID-19 vaccines and have ongoing exposure to COVID-19, you should: Begin quarantine immediately and continue to quarantine throughout the isolation period of the person with COVID-19. Continue to quarantine for an additional 5 days starting the day after the end of isolation for the person with COVID-19. Get tested at least 5 days after the end of isolation of the infected person that lives with them. If you test negative, you can leave the home but should continue to wear a well-fitting mask when around others at home and in public until 10 days after the end of isolation for the person with COVID-19. Isolate immediately if you develop symptoms of COVID-19 or test positive. If you are up to date with COVID-19 vaccines and have ongoing exposure to COVID-19, you should: Get tested at least 5 days after your first exposure. A person with COVID-19 is considered infectious starting 2 days before they develop symptoms, or 2 days before the date of their positive test if they do not have symptoms. Get tested again at least 5 days after the end of isolation for the person with COVID-19. Wear a well-fitting mask when you are around the person with COVID-19, and do this throughout their isolation period. Wear a well-fitting mask around  others for 10 days after the infected person's isolation period ends. Isolate immediately if you develop symptoms of COVID-19 or test positive. What should I do if multiple people I live with test positive for COVID-19 at different times? Recommendations for this situation depend on your vaccination status: If you are not up to date with your COVID-19 vaccines, you should: Quarantine throughout the isolation period of any infected person that you live with. Continue to quarantine until 5 days after the end of isolation date for the most recently infected person that lives with you. For example, if the last day of isolation of the person most recently infected with COVID-19 was June 30, the new 5-day quarantine period starts on July 1. Get tested at least 5 days after the end of isolation for the most recently infected person that lives with you. Wear a well-fitting mask when you are around any person with COVID-19 while that person is in isolation. Wear a well-fitting mask when you are around other people until 10 days after your last close contact. Isolate immediately if you develop symptoms of COVID-19 or test positive. If you are up to date with COVID-19 your vaccines , you should: Get tested at least 5 days after your first exposure. A person with COVID-19 is considered infectious starting 2 days before they developed symptoms, or 2 days before the date of their positive test if they do not have symptoms. Get tested again at least 5 days after the end of isolation for the most recently infected person that lives with you. Wear a well-fitting mask when you are around any person with COVID-19 while that person is in isolation. Wear a well-fitting mask around others for 10 days after the end of isolation for the most recently infected person that   lives with you. For example, if the last day of isolation for the person most recently infected with COVID-19 was June 30, the new 10-day period to wear a  well-fitting mask indoors in public starts on July 1. Isolate immediately if you develop symptoms of COVID-19 or test positive. I had COVID-19 and completed isolation. Do I have to quarantine or get tested if someone I live with gets COVID-19 shortly after I completed isolation? No. If you recently completed isolation and someone that lives with you tests positive for the virus that causes COVID-19 shortly after the end of your isolation period, you do not have to quarantine or get tested as long as you do not develop new symptoms. Once all of the people that live together have completed isolation or quarantine, refer to the guidance below for new exposures to COVID-19. If you had COVID-19 in the previous 90 days and then came into close contact with someone with COVID-19, you do not have to quarantine or get tested if you do not have symptoms. But you should: Wear a well-fitting mask indoors in public for 10 days after exposure. Monitor for COVID-19 symptoms and isolate immediately if symptoms develop. Consult with a healthcare provider for testing recommendations if new symptoms develop. If more than 90 days have passed since your recovery from infection, follow CDC's recommendations for close contacts. These recommendations will differ depending on your vaccination status. 08/30/2020 Content source: National Center for Immunization and Respiratory Diseases (NCIRD), Division of Viral Diseases This information is not intended to replace advice given to you by your health care provider. Make sure you discuss any questions you have with your healthcare provider. Document Revised: 09/07/2020 Document Reviewed: 09/07/2020 Elsevier Patient Education  2022 Elsevier Inc.  

## 2021-03-11 NOTE — Progress Notes (Addendum)
Subjective:   By signing my name below, I, Zite Okoli, attest that this documentation has been prepared under the direction and in the presence of Seabron Spates R DO. 03/11/2021    Patient ID: Megan Robbins, female    DOB: Dec 30, 1935, 85 y.o.   MRN: [id number]  Chief Complaint  Patient presents with   Nasal Congestion    Complains of nasal congestion   Cough    Complains of productive cough    HPI Patient is in today for an office visit  She is positive for Covid-19.  Her symptoms started Thursday with lump in throat and then she was hoarse on Friday.   + fever 100 , prod cough with yellow mucus  she tested positive 2 days ago.  She has dec appetite and had lost taste and smell.  She is starting to get that back   Past Medical History:  Diagnosis Date   Anxiety    Hypertension     Past Surgical History:  Procedure Laterality Date   ABDOMINAL HYSTERECTOMY     APPENDECTOMY     FOOT SURGERY     HEEL SPUR SURGERY     TONSILLECTOMY      Family History  Problem Relation Age of Onset   Hyperlipidemia Mother    Hypertension Mother    Diabetes Daughter    Diabetes Son     Social History   Socioeconomic History   Marital status: Married    Spouse name: Not on file   Number of children: Not on file   Years of education: Not on file   Highest education level: Not on file  Occupational History   Not on file  Tobacco Use   Smoking status: Never   Smokeless tobacco: Never  Vaping Use   Vaping Use: Never used  Substance and Sexual Activity   Alcohol use: No   Drug use: No   Sexual activity: Yes    Birth control/protection: Post-menopausal  Other Topics Concern   Not on file  Social History Narrative   No regular exercise   Social Determinants of Health   Financial Resource Strain: Not on file  Food Insecurity: Not on file  Transportation Needs: Not on file  Physical Activity: Not on file  Stress: Not on file  Social Connections: Not on file   Intimate Partner Violence: Not on file    Outpatient Medications Prior to Visit  Medication Sig Dispense Refill   amLODipine (NORVASC) 2.5 MG tablet TAKE 1 TABLET BY MOUTH EVERY DAY 30 tablet 2   atenolol (TENORMIN) 50 MG tablet TAKE 1 TABLET(50 MG) BY MOUTH DAILY 90 tablet 1   fexofenadine (ALLEGRA) 180 MG tablet Take 1 tablet (180 mg total) by mouth daily. 90 tablet 3   fluticasone (FLONASE) 50 MCG/ACT nasal spray Place 1 spray into both nostrils daily. 16 g 2   hydrALAZINE (APRESOLINE) 25 MG tablet TAKE 1 TABLET(25 MG) BY MOUTH TWICE DAILY 180 tablet 1   Lidocaine (HM LIDOCAINE PATCH) 4 % PTCH Apply 1 each topically daily. Remove after 12 hours each day. 15 patch 1   meclizine (ANTIVERT) 25 MG tablet Take 1 tablet (25 mg total) by mouth 3 (three) times daily as needed for dizziness. 30 tablet 0   Facility-Administered Medications Prior to Visit  Medication Dose Route Frequency Provider Last Rate Last Admin   betamethasone acetate-betamethasone sodium phosphate (CELESTONE) injection 12 mg  12 mg Intramuscular Once Felecia Shelling, DPM  betamethasone acetate-betamethasone sodium phosphate (CELESTONE) injection 3 mg  3 mg Intra-articular Once Felecia Shelling, DPM        No Known Allergies  Review of Systems  Constitutional:  Positive for fever and malaise/fatigue. Negative for diaphoresis and weight loss.  HENT:  Positive for congestion, sinus pain and sore throat.   Respiratory:  Positive for cough and sputum production. Negative for shortness of breath and wheezing.   Psychiatric/Behavioral:  Negative for depression. The patient is nervous/anxious.       Objective:    Physical Exam Vitals and nursing note reviewed.  Constitutional:      General: She is not in acute distress.    Appearance: Normal appearance. She is not ill-appearing.  HENT:     Head: Normocephalic and atraumatic.     Right Ear: External ear normal.     Left Ear: External ear normal.     Nose: Congestion  present.     Right Sinus: Maxillary sinus tenderness and frontal sinus tenderness present.     Left Sinus: Maxillary sinus tenderness and frontal sinus tenderness present.  Eyes:     Extraocular Movements: Extraocular movements intact.     Pupils: Pupils are equal, round, and reactive to light.  Cardiovascular:     Rate and Rhythm: Normal rate and regular rhythm.     Pulses: Normal pulses.     Heart sounds: Normal heart sounds. No murmur heard.   No gallop.  Pulmonary:     Effort: Pulmonary effort is normal. No respiratory distress.     Breath sounds: Normal breath sounds. No wheezing, rhonchi or rales.  Abdominal:     General: Bowel sounds are normal. There is no distension.     Palpations: Abdomen is soft. There is no mass.     Tenderness: There is no abdominal tenderness. There is no guarding or rebound.     Hernia: No hernia is present.  Musculoskeletal:     Cervical back: Normal range of motion and neck supple.  Lymphadenopathy:     Cervical: No cervical adenopathy.  Skin:    General: Skin is warm and dry.  Neurological:     Mental Status: She is alert and oriented to person, place, and time.  Psychiatric:        Behavior: Behavior normal.    There were no vitals taken for this visit. Wt Readings from Last 3 Encounters:  05/21/20 174 lb 9.6 oz (79.2 kg)  04/07/20 170 lb (77.1 kg)  06/23/19 171 lb (77.6 kg)    Diabetic Foot Exam - Simple   No data filed    Lab Results  Component Value Date   GLUCOSE 100 (H) 05/08/2017   ALT 10 05/08/2017   AST 10 05/08/2017   NA 137 05/08/2017   K 3.7 05/08/2017   CL 102 05/08/2017   CREATININE 1.70 (H) 05/08/2017   BUN 33 (H) 05/08/2017   CO2 26 05/08/2017    No results found for: TSH No results found for: WBC, HGB, HCT, MCV, PLT Lab Results  Component Value Date   NA 137 05/08/2017   K 3.7 05/08/2017   CO2 26 05/08/2017   GLUCOSE 100 (H) 05/08/2017   BUN 33 (H) 05/08/2017   CREATININE 1.70 (H) 05/08/2017    BILITOT 0.4 05/08/2017   ALKPHOS 46 05/08/2017   AST 10 05/08/2017   ALT 10 05/08/2017   PROT 7.7 05/08/2017   ALBUMIN 4.2 05/08/2017   CALCIUM 9.4 05/08/2017   GFR 37.28 (L)  05/08/2017   No results found for: CHOL No results found for: HDL No results found for: LDLCALC No results found for: TRIG No results found for: CHOLHDL No results found for: YSAY3K     Assessment & Plan:   Problem List Items Addressed This Visit       Unprioritized   Bronchitis due to COVID-19 virus - Primary    Cont flonase Can add antihistamine Antiviral and z pak sent in with cough med per orders Quarantine 5 days then can leave the house with a mask on fo 5 more days        Relevant Medications   molnupiravir EUA 200 mg CAPS   azithromycin (ZITHROMAX Z-PAK) 250 MG tablet   promethazine-dextromethorphan (PROMETHAZINE-DM) 6.25-15 MG/5ML syrup    Meds ordered this encounter  Medications   molnupiravir EUA 200 mg CAPS    Sig: Take 4 capsules (800 mg total) by mouth 2 (two) times daily for 5 days.    Dispense:  40 capsule    Refill:  0   azithromycin (ZITHROMAX Z-PAK) 250 MG tablet    Sig: As directed    Dispense:  6 each    Refill:  0   promethazine-dextromethorphan (PROMETHAZINE-DM) 6.25-15 MG/5ML syrup    Sig: Take 5 mLs by mouth 4 (four) times daily as needed.    Dispense:  118 mL    Refill:  0    I,Zite Okoli,acting as a scribe for [employer], DO.,have documented all relevant documentation on the behalf of Donato Schultz, DO,as directed by  Donato Schultz, DO while in the presence of Donato Schultz, DO.   I, Donato Schultz DO., personally preformed the services described in this documentation.  All medical record entries made by the scribe were at my direction and in my presence.  I have reviewed the chart and discharge instructions (if applicable) and agree that the record reflects my personal performance and is accurate and complete. 03/11/2021

## 2021-03-11 NOTE — Telephone Encounter (Signed)
Patient was given covid quarantine guidelines based off a home test that she states was positives when I spoke with her today at  12:53 pm. Patient stated that she has already been seen by provider and given anti viral.  I am unable to edit note from early so I am documenting under new note.

## 2021-03-11 NOTE — Assessment & Plan Note (Signed)
Cont flonase Can add antihistamine Antiviral and z pak sent in with cough med per orders Quarantine 5 days then can leave the house with a mask on fo 5 more days

## 2021-03-11 NOTE — Telephone Encounter (Signed)
Pt wanted to discuss medication prescribed at doctors office. Pt wanted to know what the medicines were for and how to take it. Advised pt types of medication and why she was put on an antiviral.  Pt started crying during the beginning of the call.  Pt stated she was scolded at her appt this morning because she was in office with Covid. Pt stated that she felt embarrassed. Pt stated she didn't know she wasn't supposed to come in with covid.  Emotional support given throughout call.  Pt complained that everything tasted like metal. She still has her sense of smell.  Pt stated that she may not take the antiviral medication. Discussed her risk factors and that the antiviral helps to fight covid and decrease the severity of sx to avid having to go to the hospital.  Pt verbalized understanding.  Reason for Disposition  General information question, no triage required and triager able to answer question  Answer Assessment - Initial Assessment Questions 1. REASON FOR CALL or QUESTION: "What is your reason for calling today?" or "How can I best help you?" or "What question do you have that I can help answer?"    Information about medication prescribed today  Protocols used: Information Only Call - No Triage-A-AH

## 2021-03-11 NOTE — Telephone Encounter (Signed)
Patient notified of positive COVID-19 test results. Pt verbalized understanding.  Criteria for self-isolation if you test positive for COVID-19, regardless of vaccination status:  -If you have mild symptoms that are resolving or have resolved, isolate at home for 5 days since symptoms started AND continue to wear a well-fitted mask when around others in the home and in public for 5 additional days after isolation is completed -If you have a fever and/or moderate to severe symptoms, isolate for at least 10 days since the symptoms started AND until you are fever free for at least 24 hours without the use of fever-reducing medications -If you tested positive and did not have symptoms, isolate for at least 5 days after your positive test  Use over-the-counter medications for symptoms.If you develop respiratory issues/distress, seek medical care in the Emergency Department.  If you must leave home or if you have to be around others please wear a mask. Please limit contact with immediate family members in the home, practice social distancing, frequent handwashing and clean hard surfaces touched frequently with household cleaning products. Members of your household will also need to quarantine and test.You may also be contacted by the health department for follow up.

## 2021-03-13 ENCOUNTER — Telehealth: Payer: Self-pay

## 2021-03-13 NOTE — Telephone Encounter (Signed)
Megan Robbins's Pt   Pt states that she has a loss of taste and no appetite. Pt would like advice

## 2021-03-13 NOTE — Telephone Encounter (Signed)
I have called pt and informed her that she needs to give the medications time to work since she just got it on Monday. She was wondering what she should do since she is having loss of appetite. I have informed her to try to do little snacks and stay hydrated. Pt reported understanding.

## 2021-03-20 ENCOUNTER — Ambulatory Visit: Payer: Self-pay

## 2021-03-20 NOTE — Telephone Encounter (Signed)
Patient called into the [facility] and stated she had COVID a few weeks ago and her taste is coming and going, she's wanting advice. I advised that she will need to speak to her PCP about this problem and I can transfer to the office or she can call them. She says she will call the office.

## 2021-03-27 ENCOUNTER — Other Ambulatory Visit: Payer: Self-pay

## 2021-03-27 ENCOUNTER — Ambulatory Visit: Payer: Medicare Other | Admitting: Podiatry

## 2021-03-27 DIAGNOSIS — M722 Plantar fascial fibromatosis: Secondary | ICD-10-CM | POA: Diagnosis not present

## 2021-03-27 MED ORDER — BETAMETHASONE SOD PHOS & ACET 6 (3-3) MG/ML IJ SUSP
3.0000 mg | Freq: Once | INTRAMUSCULAR | Status: AC
  Administered 2021-03-27: 3 mg via INTRA_ARTICULAR

## 2021-03-27 NOTE — Progress Notes (Signed)
   Subjective: 85 y.o. female presenting today for evaluation of bilateral heel pain.  Patient states that she is finally in a position where she would like to pursue the surgery.  She has chronic bilateral heel pain.  She says the injections helped significantly.  She is also requesting an injection today.  Past Medical History:  Diagnosis Date   Anxiety    Hypertension      Objective: Physical Exam General: The patient is alert and oriented x3 in no acute distress.  Dermatology: Skin is warm, dry and supple bilateral lower extremities. Negative for open lesions or macerations bilateral.   Vascular: Dorsalis Pedis and Posterior Tibial pulses palpable bilateral.  Capillary fill time is immediate to all digits.  Neurological: Epicritic and protective threshold intact bilateral.   Musculoskeletal: Tenderness to palpation to the plantar aspect of the bilateral heels along the plantar fascia. All other joints range of motion within normal limits bilateral. Strength 5/5 in all groups bilateral.   MRI impression 08/16/2020 RT foot:  Moderate to moderately severe appearing plantar fasciitis of the medial cord. There is a small interstitial tear in the plantar fascia but no frank rupture. Mild reactive marrow edema in the calcaneus at the plantar fascia attachment noted.  MRI impression 08/16/2020 LT foot:  Mild plantar fasciitis. No plantar fascial tear.  Assessment: 1. Plantar fasciitis bilateral 2.  History of plantar fasciotomy approximately 10 years ago  Plan of Care:  1. Patient evaluated. 2.  Patient requested injections today.  Injection of 0.5 cc Celestone Soluspan injected in the bilateral plantar fascia 3. Recommend continuing good supportive shoes 4. Today we discussed again today the conservative versus surgical management of the presenting pathology.  Patient states that today she would like to pursue surgery.  She is finally in a position to have the surgery performed.   All possible complications and details of the procedure were explained. All patient questions were answered. No guarantees were expressed or implied. 5. Authorization for surgery was initiated today. Surgery will consist of endoscopic plantar fasciotomy bilateral 6.  Return to clinic 1 week postop   Felecia Shelling, DPM [facility]  Dr. Felecia Shelling, DPM    [address] [place], [place] [address]                Office [phone]  Fax [phone]

## 2021-04-14 ENCOUNTER — Other Ambulatory Visit: Payer: Self-pay | Admitting: Family

## 2021-04-15 ENCOUNTER — Telehealth: Payer: Self-pay | Admitting: Family

## 2021-04-15 NOTE — Telephone Encounter (Signed)
Megan Robbins has requested to switch to our office, states [place] is too far for her  Please advise if this is okay, and if so I will call her and set her up at our office.

## 2021-04-25 ENCOUNTER — Other Ambulatory Visit: Payer: Self-pay | Admitting: Family

## 2021-04-25 DIAGNOSIS — I10 Essential (primary) hypertension: Secondary | ICD-10-CM

## 2021-04-30 ENCOUNTER — Telehealth: Payer: Self-pay | Admitting: Family

## 2021-04-30 NOTE — Telephone Encounter (Signed)
Patient would like meds for runny nose and cough but does not have covid. Please advise.   [facility] [phone] - [place], [place] - [address] AT [facility] [address]  [address] [place] [address]  Phone:  [phone]  Fax:  [phone]

## 2021-04-30 NOTE — Telephone Encounter (Signed)
Pt reported that she had a small cough and runny nose with some drainage. Pt wanted to know providers recommendation. After speaking with the provider pt is able to take OTC Coricidin and an allergy medication, since it is allergy season as well. Pt reported understanding and will give [place] a call to make an appointment if she doesn't feel better.

## 2021-05-01 ENCOUNTER — Other Ambulatory Visit: Payer: Self-pay | Admitting: *Deleted

## 2021-05-01 NOTE — Telephone Encounter (Signed)
error 

## 2021-05-10 ENCOUNTER — Other Ambulatory Visit: Payer: Self-pay

## 2021-05-10 ENCOUNTER — Encounter: Payer: Self-pay | Admitting: Nurse Practitioner

## 2021-05-10 ENCOUNTER — Ambulatory Visit (INDEPENDENT_AMBULATORY_CARE_PROVIDER_SITE_OTHER): Payer: Medicare Other | Admitting: Nurse Practitioner

## 2021-05-10 ENCOUNTER — Other Ambulatory Visit: Payer: Self-pay | Admitting: Nurse Practitioner

## 2021-05-10 VITALS — BP 136/84 | HR 66 | Temp 97.6°F | Ht 63.0 in | Wt 168.0 lb

## 2021-05-10 DIAGNOSIS — Z23 Encounter for immunization: Secondary | ICD-10-CM | POA: Diagnosis not present

## 2021-05-10 DIAGNOSIS — J302 Other seasonal allergic rhinitis: Secondary | ICD-10-CM | POA: Diagnosis not present

## 2021-05-10 DIAGNOSIS — I10 Essential (primary) hypertension: Secondary | ICD-10-CM

## 2021-05-10 DIAGNOSIS — E663 Overweight: Secondary | ICD-10-CM | POA: Diagnosis not present

## 2021-05-10 DIAGNOSIS — E785 Hyperlipidemia, unspecified: Secondary | ICD-10-CM

## 2021-05-10 DIAGNOSIS — Z1329 Encounter for screening for other suspected endocrine disorder: Secondary | ICD-10-CM

## 2021-05-10 DIAGNOSIS — Z131 Encounter for screening for diabetes mellitus: Secondary | ICD-10-CM | POA: Diagnosis not present

## 2021-05-10 DIAGNOSIS — N1832 Chronic kidney disease, stage 3b: Secondary | ICD-10-CM

## 2021-05-10 DIAGNOSIS — R7989 Other specified abnormal findings of blood chemistry: Secondary | ICD-10-CM

## 2021-05-10 DIAGNOSIS — Z13220 Encounter for screening for lipoid disorders: Secondary | ICD-10-CM

## 2021-05-10 LAB — LIPID PANEL
Cholesterol: 267 mg/dL — ABNORMAL HIGH (ref 0–200)
HDL: 69.4 mg/dL (ref >39.00)
LDL Cholesterol: 181 mg/dL — ABNORMAL HIGH (ref 0–99)
NonHDL: 197.23
Total CHOL/HDL Ratio: 4
Triglycerides: 81 mg/dL (ref 0.0–149.0)
VLDL: 16.2 mg/dL (ref 0.0–40.0)

## 2021-05-10 LAB — COMPREHENSIVE METABOLIC PANEL
ALT: 9 U/L (ref 0–35)
AST: 14 U/L (ref 0–37)
Albumin: 4.1 g/dL (ref 3.5–5.2)
Alkaline Phosphatase: 40 U/L (ref 39–117)
BUN: 24 mg/dL — ABNORMAL HIGH (ref 6–23)
CO2: 24 mEq/L (ref 19–32)
Calcium: 9.3 mg/dL (ref 8.4–10.5)
Chloride: 105 mEq/L (ref 96–112)
Creatinine, Ser: 1.54 mg/dL — ABNORMAL HIGH (ref 0.40–1.20)
GFR: 31.14 mL/min — ABNORMAL LOW (ref >60.00)
Glucose, Bld: 94 mg/dL (ref 70–99)
Potassium: 3.8 mEq/L (ref 3.5–5.1)
Sodium: 140 mEq/L (ref 135–145)
Total Bilirubin: 0.7 mg/dL (ref 0.2–1.2)
Total Protein: 7.4 g/dL (ref 6.0–8.3)

## 2021-05-10 LAB — CBC WITH DIFFERENTIAL/PLATELET
Basophils Absolute: 0 10*3/uL (ref 0.0–0.1)
Basophils Relative: 0.3 % (ref 0.0–3.0)
Eosinophils Absolute: 0.2 10*3/uL (ref 0.0–0.7)
Eosinophils Relative: 2.5 % (ref 0.0–5.0)
HCT: 35.3 % — ABNORMAL LOW (ref 36.0–46.0)
Hemoglobin: 11.5 g/dL — ABNORMAL LOW (ref 12.0–15.0)
Lymphocytes Relative: 49.9 % — ABNORMAL HIGH (ref 12.0–46.0)
Lymphs Abs: 3.2 10*3/uL (ref 0.7–4.0)
MCHC: 32.5 g/dL (ref 30.0–36.0)
MCV: 88.5 fl (ref 78.0–100.0)
Monocytes Absolute: 0.5 10*3/uL (ref 0.1–1.0)
Monocytes Relative: 8 % (ref 3.0–12.0)
Neutro Abs: 2.5 10*3/uL (ref 1.4–7.7)
Neutrophils Relative %: 39.3 % — ABNORMAL LOW (ref 43.0–77.0)
Platelets: 227 10*3/uL (ref 150.0–400.0)
RBC: 3.99 Mil/uL (ref 3.87–5.11)
RDW: 17.1 % — ABNORMAL HIGH (ref 11.5–15.5)
WBC: 6.5 10*3/uL (ref 4.0–10.5)

## 2021-05-10 LAB — TSH: TSH: 0.1 u[IU]/mL — ABNORMAL LOW (ref 0.35–5.50)

## 2021-05-10 LAB — HEMOGLOBIN A1C: Hgb A1c MFr Bld: 6.3 % (ref 4.6–6.5)

## 2021-05-10 NOTE — Addendum Note (Signed)
Addended by: Waldemar Dickens B on: 05/10/2021 08:35 AM   Modules accepted: Orders

## 2021-05-10 NOTE — Progress Notes (Signed)
Subjective:  Patient ID: Megan Robbins, female    DOB: [birth year]  Age: 85 y.o. MRN: [id number]  CC:  Chief Complaint  Patient presents with   Transfer of Care       HPI  This patient arrives today for the above.   She was previously being seen by Ria Clock, FNP but is not transferring to myself or primary care.  Only concern today is that she has some intermittent postnasal drip which causes a cough.  She is wondering what she can take for this.  She is currently already on Allegra daily as well as Flonase nasal spray daily.  She is also due for blood work.  Past Medical History:  Diagnosis Date   Anxiety    Hypertension       Family History  Problem Relation Age of Onset   Hyperlipidemia Mother    Hypertension Mother    Diabetes Daughter    Diabetes Son     Social History   Social History Narrative   No regular exercise   Social History   Tobacco Use   Smoking status: Never   Smokeless tobacco: Never  Substance Use Topics   Alcohol use: No     Current Meds  Medication Sig   amLODipine (NORVASC) 2.5 MG tablet TAKE 1 TABLET BY MOUTH EVERY DAY   atenolol (TENORMIN) 50 MG tablet TAKE 1 TABLET(50 MG) BY MOUTH DAILY   fexofenadine (ALLEGRA) 180 MG tablet Take 1 tablet (180 mg total) by mouth daily.   fluticasone (FLONASE) 50 MCG/ACT nasal spray Place 1 spray into both nostrils daily.   hydrALAZINE (APRESOLINE) 25 MG tablet TAKE 1 TABLET(25 MG) BY MOUTH TWICE DAILY   Lidocaine (HM LIDOCAINE PATCH) 4 % PTCH Apply 1 each topically daily. Remove after 12 hours each day.   meclizine (ANTIVERT) 25 MG tablet Take 1 tablet (25 mg total) by mouth 3 (three) times daily as needed for dizziness.   promethazine-dextromethorphan (PROMETHAZINE-DM) 6.25-15 MG/5ML syrup Take 5 mLs by mouth 4 (four) times daily as needed.   [DISCONTINUED] azithromycin (ZITHROMAX Z-PAK) 250 MG tablet As directed   Current Facility-Administered Medications for the 05/10/21 encounter  (Office Visit) with Elenore Paddy, NP  Medication   betamethasone acetate-betamethasone sodium phosphate (CELESTONE) injection 12 mg   betamethasone acetate-betamethasone sodium phosphate (CELESTONE) injection 3 mg    ROS:  Review of Systems  Constitutional:  Negative for fever, malaise/fatigue and weight loss.  Respiratory:  Positive for cough. Negative for sputum production and shortness of breath.   Cardiovascular:  Negative for chest pain.  Neurological:  Negative for dizziness and headaches.    Objective:   Today's Vitals: BP 136/84 (BP Location: Left Arm, Patient Position: Sitting, Cuff Size: Large)   Pulse 66   Temp 97.6 F (36.4 C) (Oral)   Ht 5\' 3"  (1.6 m)   Wt 168 lb (76.2 kg)   SpO2 96%   BMI 29.76 kg/m  Vitals with BMI 05/10/2021 03/11/2021 02/22/2021  Height 5\' 3"  - -  Weight 168 lbs 176 lbs -  BMI 29.77 - -  Systolic 136 - 171  Diastolic 84 - 103  Pulse 66 - 58     Physical Exam Vitals reviewed.  Constitutional:      General: She is not in acute distress.    Appearance: Normal appearance.  HENT:     Head: Normocephalic and atraumatic.  Neck:     Vascular: No carotid bruit.  Cardiovascular:  Rate and Rhythm: Normal rate and regular rhythm.     Pulses: Normal pulses.     Heart sounds: Normal heart sounds.  Pulmonary:     Effort: Pulmonary effort is normal.     Breath sounds: Normal breath sounds.  Skin:    General: Skin is warm and dry.  Neurological:     General: No focal deficit present.     Mental Status: She is alert and oriented to person, place, and time.  Psychiatric:        Mood and Affect: Mood normal.        Behavior: Behavior normal.        Judgment: Judgment normal.         Assessment and Plan   1. Essential hypertension   2. Flu vaccine need   3. Seasonal allergic rhinitis, unspecified trigger   4. Overweight   5. Screening, lipid   6. Screening for thyroid disorder   7. Screening for diabetes mellitus       Plan: 1.,  4.-7.  We will check blood work today for further evaluation. 2.  Flu vaccine administered. 3.  I encouraged her to continue taking her Allegra and Flonase nasal spray.  She is also encouraged to try Mucinex over-the-counter as needed for cough.   Tests ordered Orders Placed This Encounter  Procedures   Flu Vaccine QUAD High Dose(Fluad)   TSH   Hemoglobin A1c   Lipid panel   Comprehensive metabolic panel   CBC with Differential/Platelet      No orders of the defined types were placed in this encounter.   Patient to follow-up in 3 months for comprehensive exam and to discuss health maintenance, or sooner as needed.  Elenore Paddy, NP

## 2021-05-18 ENCOUNTER — Other Ambulatory Visit: Payer: Self-pay | Admitting: Family

## 2021-05-18 DIAGNOSIS — I10 Essential (primary) hypertension: Secondary | ICD-10-CM

## 2021-05-20 ENCOUNTER — Other Ambulatory Visit: Payer: Self-pay

## 2021-05-20 ENCOUNTER — Telehealth: Payer: Self-pay | Admitting: Nurse Practitioner

## 2021-05-20 DIAGNOSIS — I10 Essential (primary) hypertension: Secondary | ICD-10-CM

## 2021-05-20 MED ORDER — AMLODIPINE BESYLATE 2.5 MG PO TABS: 2.5000 mg | ORAL_TABLET | Freq: Every day | ORAL | 0 refills | Status: DC

## 2021-05-20 MED ORDER — HYDRALAZINE HCL 25 MG PO TABS: ORAL_TABLET | ORAL | 1 refills | Status: DC

## 2021-05-20 MED ORDER — ATENOLOL 50 MG PO TABS: ORAL_TABLET | ORAL | 0 refills | Status: DC

## 2021-05-20 NOTE — Telephone Encounter (Signed)
1.Medication Requested: amLODipine (NORVASC) 2.5 MG tablet atenolol (TENORMIN) 50 MG tablet  hydrALAZINE (APRESOLINE) 25 MG tablet 2. Pharmacy (Name, Street, [place]): [facility] [phone] - [place], [place] - [address] AT [address] Phone:  [phone]  Fax:  [phone]      3. On Med List: Y  4. Last Visit with PCP:  10.7.22  5. Next visit date with PCP:  1.12.23  **Patient is requesting a 90 day supply  Agent: Please be advised that RX refills may take up to 3 business days. We ask that you follow-up with your pharmacy.

## 2021-05-27 ENCOUNTER — Other Ambulatory Visit: Payer: Self-pay

## 2021-05-27 ENCOUNTER — Ambulatory Visit: Payer: Medicare Other | Admitting: Podiatry

## 2021-05-27 DIAGNOSIS — M722 Plantar fascial fibromatosis: Secondary | ICD-10-CM | POA: Diagnosis not present

## 2021-05-27 MED ORDER — BETAMETHASONE SOD PHOS & ACET 6 (3-3) MG/ML IJ SUSP
3.0000 mg | Freq: Once | INTRAMUSCULAR | Status: AC
  Administered 2021-05-27: 3 mg via INTRA_ARTICULAR

## 2021-05-27 NOTE — Progress Notes (Signed)
   Subjective: 85 y.o. female presenting today for evaluation of bilateral heel pain.  Patient is still having reservations about the surgery.  She has chronic bilateral plantar fasciitis.  She presents today to have injections and readdress surgery Patient also states that she is suffering from some possible ingrown toenails to the bilateral great toes.  She would like to have them evaluated.   Past Medical History:  Diagnosis Date   Anxiety    Hypertension      Objective: Physical Exam General: The patient is alert and oriented x3 in no acute distress.  Dermatology: Skin is warm, dry and supple bilateral lower extremities. Negative for open lesions or macerations bilateral.  Elongated nails noted to the bilateral great toes with some curvature and evidence of a mild ingrowing nail  Vascular: Dorsalis Pedis and Posterior Tibial pulses palpable bilateral.  Capillary fill time is immediate to all digits.  Neurological: Epicritic and protective threshold intact bilateral.   Musculoskeletal: Tenderness to palpation to the plantar aspect of the bilateral heels along the plantar fascia. All other joints range of motion within normal limits bilateral. Strength 5/5 in all groups bilateral.   MRI impression 08/16/2020 RT foot:  Moderate to moderately severe appearing plantar fasciitis of the medial cord. There is a small interstitial tear in the plantar fascia but no frank rupture. Mild reactive marrow edema in the calcaneus at the plantar fascia attachment noted.  MRI impression 08/16/2020 LT foot:  Mild plantar fasciitis. No plantar fascial tear.  Assessment: 1. Plantar fasciitis bilateral 2.  History of plantar fasciotomy approximately 10 years ago 3.  Pain due to incurvated nails bilateral great toes  Plan of Care:  1. Patient evaluated. 2.  Patient requested injections today.  Injection of 0.5 cc Celestone Soluspan injected in the bilateral plantar fascia 3.  Patient continues to  have some reservations with surgery.  She is very nervous with the surgery.  We discussed the surgery again today and all the details with her husband present.  She understands at one point she should have the surgery performed since routine injections and conservative treatment modalities do not provide any lasting relief. 4.  Mechanical debridement of the bilateral great toenails was performed and the patient felt relief 5.  Return to clinic as needed  Felecia Shelling, DPM [facility]  Dr. Felecia Shelling, DPM    [address] [place], [place] [address]                Office [phone]  Fax [phone]

## 2021-06-13 ENCOUNTER — Telehealth: Payer: Self-pay | Admitting: Nurse Practitioner

## 2021-06-13 NOTE — Telephone Encounter (Signed)
Patient requesting a call back to discuss burning and frequent urination  Patient has an appt for 06-18-2021

## 2021-06-13 NOTE — Progress Notes (Signed)
Subjective:    Patient ID: Megan Robbins, female    DOB: 03/26/1936, 85 y.o.   MRN: [id number]  This visit occurred during the SARS-CoV-2 public health emergency.  Safety protocols were in place, including screening questions prior to the visit, additional usage of staff PPE, and extensive cleaning of exam room while observing appropriate contact time as indicated for disinfecting solutions.    HPI The patient is here for an acute visit.  ? UTI:  Her symptoms started  2-3 days ago.  She states dysuria  No urinary frequency, urinary urgency, hematuria, abdominal pain, back pain, nausea, fever.  She has been drinking cranberry juice and more water since yesterday and it has helped.   She knows she does not drink enough.      Medications and allergies reviewed with patient and updated if appropriate.  Patient Active Problem List   Diagnosis Date Noted   Dysuria 06/14/2021   Bronchitis due to COVID-19 virus 03/11/2021   Low back strain, initial encounter 02/22/2021   Rash 06/23/2019   Eustachian tube dysfunction, right 04/01/2018   Impacted cerumen of left ear 04/01/2018   Seasonal allergic rhinitis 04/01/2018   Function kidney decreased 05/13/2017   Compression fracture of L3 vertebra (HCC) 05/13/2017   Mid back pain 05/08/2017   Stress and adjustment reaction 05/08/2017   Overweight 05/23/2015   Essential hypertension 05/08/2015    Current Outpatient Medications on File Prior to Visit  Medication Sig Dispense Refill   amLODipine (NORVASC) 2.5 MG tablet Take 1 tablet (2.5 mg total) by mouth daily. 30 tablet 0   atenolol (TENORMIN) 50 MG tablet TAKE 1 TABLET(50 MG) BY MOUTH DAILY 30 tablet 0   fexofenadine (ALLEGRA) 180 MG tablet Take 1 tablet (180 mg total) by mouth daily. 90 tablet 3   fluticasone (FLONASE) 50 MCG/ACT nasal spray Place 1 spray into both nostrils daily. 16 g 2   hydrALAZINE (APRESOLINE) 25 MG tablet TAKE 1 TABLET(25 MG) BY MOUTH TWICE DAILY 180 tablet  1   Lidocaine (HM LIDOCAINE PATCH) 4 % PTCH Apply 1 each topically daily. Remove after 12 hours each day. 15 patch 1   meclizine (ANTIVERT) 25 MG tablet Take 1 tablet (25 mg total) by mouth 3 (three) times daily as needed for dizziness. 30 tablet 0   promethazine-dextromethorphan (PROMETHAZINE-DM) 6.25-15 MG/5ML syrup Take 5 mLs by mouth 4 (four) times daily as needed. 118 mL 0   Current Facility-Administered Medications on File Prior to Visit  Medication Dose Route Frequency Provider Last Rate Last Admin   betamethasone acetate-betamethasone sodium phosphate (CELESTONE) injection 12 mg  12 mg Intramuscular Once Evans, Brent M, DPM       betamethasone acetate-betamethasone sodium phosphate (CELESTONE) injection 3 mg  3 mg Intra-articular Once Felecia Shelling, DPM        Past Medical History:  Diagnosis Date   Anxiety    Hypertension     Past Surgical History:  Procedure Laterality Date   ABDOMINAL HYSTERECTOMY     APPENDECTOMY     FOOT SURGERY     HEEL SPUR SURGERY     TONSILLECTOMY      Social History   Socioeconomic History   Marital status: Married    Spouse name: Not on file   Number of children: Not on file   Years of education: Not on file   Highest education level: Not on file  Occupational History   Not on file  Tobacco Use   Smoking status:  Never   Smokeless tobacco: Never  Vaping Use   Vaping Use: Never used  Substance and Sexual Activity   Alcohol use: No   Drug use: No   Sexual activity: Yes    Birth control/protection: Post-menopausal  Other Topics Concern   Not on file  Social History Narrative   No regular exercise   Social Determinants of Health   Financial Resource Strain: Not on file  Food Insecurity: Not on file  Transportation Needs: Not on file  Physical Activity: Not on file  Stress: Not on file  Social Connections: Not on file    Family History  Problem Relation Age of Onset   Hyperlipidemia Mother    Hypertension Mother     Diabetes Daughter    Diabetes Son     Review of Systems     Objective:   Vitals:   06/14/21 1356  BP: 130/82  Pulse: 69  Temp: 98.4 F (36.9 C)  SpO2: 98%   BP Readings from Last 3 Encounters:  06/14/21 130/82  05/10/21 136/84  02/22/21 (!) 171/103   Wt Readings from Last 3 Encounters:  06/14/21 170 lb (77.1 kg)  05/10/21 168 lb (76.2 kg)  03/11/21 176 lb (79.8 kg)   Body mass index is 30.11 kg/m.   Physical Exam    Constitutional:      General: She is not in acute distress.    Appearance: Normal appearance. She is not ill-appearing.  HENT:     Head: Normocephalic and atraumatic.  Abdominal:     General: There is no distension.     Palpations: Abdomen is soft.     Tenderness: There is no abdominal tenderness. There is no right CVA tenderness, left CVA tenderness, guarding or rebound.  Skin:    General: Skin is warm and dry.  Neurological:     Mental Status: She is alert.       Assessment & Plan:    See Problem List for Assessment and Plan of chronic medical problems.

## 2021-06-14 ENCOUNTER — Encounter: Payer: Self-pay | Admitting: Internal Medicine

## 2021-06-14 ENCOUNTER — Other Ambulatory Visit: Payer: Self-pay

## 2021-06-14 ENCOUNTER — Other Ambulatory Visit (INDEPENDENT_AMBULATORY_CARE_PROVIDER_SITE_OTHER): Payer: Medicare Other

## 2021-06-14 ENCOUNTER — Ambulatory Visit (INDEPENDENT_AMBULATORY_CARE_PROVIDER_SITE_OTHER): Payer: Medicare Other | Admitting: Internal Medicine

## 2021-06-14 VITALS — BP 130/82 | HR 69 | Temp 98.4°F | Ht 63.0 in | Wt 170.0 lb

## 2021-06-14 DIAGNOSIS — R300 Dysuria: Secondary | ICD-10-CM

## 2021-06-14 HISTORY — DX: Dysuria: R30.0

## 2021-06-14 LAB — POC URINALSYSI DIPSTICK (AUTOMATED)
Bilirubin, UA: NEGATIVE
Blood, UA: NEGATIVE
Glucose, UA: NEGATIVE
Ketones, UA: NEGATIVE
Leukocytes, UA: NEGATIVE
Nitrite, UA: NEGATIVE
Protein, UA: POSITIVE — AB
Spec Grav, UA: >=1.03 — AB (ref 1.010–1.025)
Urobilinogen, UA: 0.2 E.U./dL
pH, UA: 5.5 (ref 5.0–8.0)

## 2021-06-14 MED ORDER — PHENAZOPYRIDINE HCL 100 MG PO TABS: 100.0000 mg | ORAL_TABLET | Freq: Three times a day (TID) | ORAL | 0 refills | Status: DC | PRN

## 2021-06-14 NOTE — Patient Instructions (Addendum)
   INCREASE YOUR WATER INTAKE  Your preliminary urine does not show an infection - we will send your urine for a culture.     Medications changes include :   pyridium 100 mg three times a day as needed for burning with urination.  Your prescription(s) have been submitted to your pharmacy. Please take as directed and contact our office if you believe you are having problem(s) with the medication(s).   If that is not covered take AZO which is otc.    We will call you Monday with the results of your urine.

## 2021-06-14 NOTE — Assessment & Plan Note (Signed)
Acute Likely related to dehydration Urine dip w/o evidence of an infection - will send for cx Increase water intake Pyridium 100 mg tid prn x few days for dysuria - expect this to improve with increased water

## 2021-06-17 LAB — CULTURE, URINE COMPREHENSIVE

## 2021-06-18 ENCOUNTER — Ambulatory Visit: Payer: Medicare Other | Admitting: Internal Medicine

## 2021-06-20 ENCOUNTER — Other Ambulatory Visit: Payer: Self-pay | Admitting: Nurse Practitioner

## 2021-06-20 DIAGNOSIS — I10 Essential (primary) hypertension: Secondary | ICD-10-CM

## 2021-06-24 ENCOUNTER — Telehealth: Payer: Self-pay | Admitting: Nurse Practitioner

## 2021-06-24 NOTE — Telephone Encounter (Signed)
Caller connected to [facility] 11.19.2022.   Caller states she is almost out of Amlodipine 2.5 mg and Atenolol 50 mg. She has enough to last until Monday. She uses [facility], [phone]. Advised caller to call back Monday during office hours. Caller verbalizes understanding.    1.Medication Requested: amLODipine (NORVASC) 2.5 MG tablet  2. Pharmacy (Name, Street, [place]): [facility] [phone] - [place], [place] - [address] Phone:  [phone]  Fax:  [phone]     3. On Med List: Y  4. Last Visit with PCP: 10.7.2022  5. Next visit date with PCP: 1.12.2023   Agent: Please be advised that RX refills may take up to 3 business days. We ask that you follow-up with your pharmacy.

## 2021-06-24 NOTE — Telephone Encounter (Signed)
Patient calling to check status of refill, patient requesting 90 day supply

## 2021-06-26 ENCOUNTER — Other Ambulatory Visit: Payer: Self-pay

## 2021-06-26 ENCOUNTER — Ambulatory Visit: Payer: Medicare Other | Admitting: Podiatry

## 2021-06-26 DIAGNOSIS — M722 Plantar fascial fibromatosis: Secondary | ICD-10-CM | POA: Diagnosis not present

## 2021-06-26 MED ORDER — BETAMETHASONE SOD PHOS & ACET 6 (3-3) MG/ML IJ SUSP
3.0000 mg | Freq: Once | INTRAMUSCULAR | Status: AC
Start: 2021-06-26 — End: 2021-06-26
  Administered 2021-06-26: 3 mg via INTRA_ARTICULAR

## 2021-06-26 NOTE — Progress Notes (Signed)
   Subjective: 85 y.o. female presenting today for evaluation of bilateral heel pain.  Patient is still having reservations about the surgery.  She has chronic bilateral plantar fasciitis.  She presents today to have injections because she is having an acute flareup of pain.  She presents for further treatment evaluation.  No new complaints at this time   Past Medical History:  Diagnosis Date   Anxiety    Hypertension      Objective: Physical Exam General: The patient is alert and oriented x3 in no acute distress.  Dermatology: Skin is warm, dry and supple bilateral lower extremities. Negative for open lesions or macerations bilateral.  Elongated nails noted to the bilateral great toes with some curvature and evidence of a mild ingrowing nail  Vascular: Dorsalis Pedis and Posterior Tibial pulses palpable bilateral.  Capillary fill time is immediate to all digits.  Neurological: Epicritic and protective threshold intact bilateral.   Musculoskeletal: Tenderness to palpation to the plantar aspect of the bilateral heels along the plantar fascia. All other joints range of motion within normal limits bilateral. Strength 5/5 in all groups bilateral.   MRI impression 08/16/2020 RT foot:  Moderate to moderately severe appearing plantar fasciitis of the medial cord. There is a small interstitial tear in the plantar fascia but no frank rupture. Mild reactive marrow edema in the calcaneus at the plantar fascia attachment noted.  MRI impression 08/16/2020 LT foot:  Mild plantar fasciitis. No plantar fascial tear.  Assessment: 1. Plantar fasciitis bilateral 2.  History of plantar fasciotomy approximately 10 years ago 3.  Pain due to incurvated nails bilateral great toes  Plan of Care:  1. Patient evaluated. 2.  Patient requested injections today.  Injection of 0.5 cc Celestone Soluspan injected in the bilateral plantar fascia 3.  Patient continues to have some reservations with surgery.  She  is very nervous with the surgery.  We discussed the surgery again today and all the details with her husband present.  She understands at one point she should have the surgery performed since routine injections and conservative treatment modalities do not provide any lasting relief. 4.  Return to clinic as needed  Felecia Shelling, DPM [facility]  Dr. Felecia Shelling, DPM    [address] [place], [place] [address]                Office [phone]  Fax [phone]

## 2021-07-22 ENCOUNTER — Telehealth: Payer: Self-pay | Admitting: Nurse Practitioner

## 2021-07-22 NOTE — Telephone Encounter (Signed)
1.Medication Requested: amLODipine (NORVASC) 2.5 MG tablet  2. Pharmacy (Name, Street, [place]): [facility] [phone] - [place], [place] - [address]  3. On Med List: yes  4. Last Visit with PCP: 06-18-2021  5. Next visit date with PCP: 08-26-2021  Patient  requesting 90 day supply

## 2021-07-23 ENCOUNTER — Other Ambulatory Visit: Payer: Self-pay

## 2021-07-23 ENCOUNTER — Other Ambulatory Visit: Payer: Self-pay | Admitting: Nurse Practitioner

## 2021-07-23 DIAGNOSIS — I10 Essential (primary) hypertension: Secondary | ICD-10-CM

## 2021-07-23 MED ORDER — AMLODIPINE BESYLATE 2.5 MG PO TABS: ORAL_TABLET | ORAL | 0 refills | Status: DC

## 2021-07-30 ENCOUNTER — Other Ambulatory Visit: Payer: Self-pay

## 2021-07-31 NOTE — Telephone Encounter (Signed)
Patient calling in to check status of refill request  Patient says she is currently out of medication  Please call [phone]

## 2021-08-01 ENCOUNTER — Other Ambulatory Visit: Payer: Self-pay

## 2021-08-01 DIAGNOSIS — I10 Essential (primary) hypertension: Secondary | ICD-10-CM

## 2021-08-01 MED ORDER — ATENOLOL 50 MG PO TABS: ORAL_TABLET | ORAL | 3 refills | Status: DC

## 2021-08-12 ENCOUNTER — Ambulatory Visit: Payer: Medicare Other | Admitting: Podiatry

## 2021-08-12 ENCOUNTER — Other Ambulatory Visit: Payer: Self-pay

## 2021-08-12 DIAGNOSIS — M722 Plantar fascial fibromatosis: Secondary | ICD-10-CM | POA: Diagnosis not present

## 2021-08-12 DIAGNOSIS — M79675 Pain in left toe(s): Secondary | ICD-10-CM | POA: Diagnosis not present

## 2021-08-12 DIAGNOSIS — M79674 Pain in right toe(s): Secondary | ICD-10-CM | POA: Diagnosis not present

## 2021-08-12 DIAGNOSIS — B351 Tinea unguium: Secondary | ICD-10-CM | POA: Diagnosis not present

## 2021-08-15 ENCOUNTER — Ambulatory Visit: Payer: Medicare Other | Admitting: Nurse Practitioner

## 2021-08-18 DIAGNOSIS — M722 Plantar fascial fibromatosis: Secondary | ICD-10-CM | POA: Diagnosis not present

## 2021-08-18 MED ORDER — BETAMETHASONE SOD PHOS & ACET 6 (3-3) MG/ML IJ SUSP
3.0000 mg | Freq: Once | INTRAMUSCULAR | Status: AC
  Administered 2021-08-18: 3 mg via INTRA_ARTICULAR

## 2021-08-18 NOTE — Progress Notes (Signed)
° °  Subjective: 86 y.o. female presenting today for evaluation of bilateral heel pain.  Patient is still having reservations about the surgery.  She has chronic bilateral plantar fasciitis.  She presents today to have injections because she is having an acute flareup of pain.  She presents for further treatment evaluation.    Patient is also requesting to have her nails trimmed.  She does have pain and tenderness associated to her toenails and she is unable to trim her own nails.  They are especially tender in close toed shoes.   Past Medical History:  Diagnosis Date   Anxiety    Hypertension      Objective: Physical Exam General: The patient is alert and oriented x3 in no acute distress.  Dermatology: Skin is warm, dry and supple bilateral lower extremities. Negative for open lesions or macerations bilateral.  Hyperkeratotic elongated nails noted with curvature of the bilateral great toenails to the bilateral feet 1-5  Vascular: Dorsalis Pedis and Posterior Tibial pulses palpable bilateral.  Capillary fill time is immediate to all digits.  Neurological: Epicritic and protective threshold intact bilateral.   Musculoskeletal: There continues to be chronic tenderness to palpation to the plantar aspect of the bilateral heels along the plantar fascia. All other joints range of motion within normal limits bilateral. Strength 5/5 in all groups bilateral.   MRI impression 08/16/2020 RT foot:  Moderate to moderately severe appearing plantar fasciitis of the medial cord. There is a small interstitial tear in the plantar fascia but no frank rupture. Mild reactive marrow edema in the calcaneus at the plantar fascia attachment noted.  MRI impression 08/16/2020 LT foot:  Mild plantar fasciitis. No plantar fascial tear.  Assessment: 1. Plantar fasciitis bilateral 2.  History of plantar fasciotomy approximately 10 years ago 3.  Pain due to onychomycosis of toenails both  Plan of Care:  1.  Patient evaluated. 2.  Patient requested injections today.  Injection of 0.5 cc Celestone Soluspan injected in the bilateral plantar fascia 3.  Mechanical debridement of nails 1-5 using a nail nipper was performed without incident or bleeding.  The patient did feel relief  4.  Continue wearing good supportive shoes and sneakers and conservative care for her chronic plantar fasciitis since she does not want to pursue any surgery 5.  Return to clinic as needed  Felecia Shelling, DPM [facility]  Dr. Felecia Shelling, DPM    [address] [place], [place] [address]                Office [phone]  Fax [phone]

## 2021-08-19 ENCOUNTER — Other Ambulatory Visit: Payer: Self-pay | Admitting: Nurse Practitioner

## 2021-08-19 DIAGNOSIS — I10 Essential (primary) hypertension: Secondary | ICD-10-CM

## 2021-08-23 ENCOUNTER — Other Ambulatory Visit: Payer: Self-pay | Admitting: Nurse Practitioner

## 2021-08-23 DIAGNOSIS — I10 Essential (primary) hypertension: Secondary | ICD-10-CM

## 2021-08-23 MED ORDER — AMLODIPINE BESYLATE 2.5 MG PO TABS: ORAL_TABLET | ORAL | 0 refills | Status: DC

## 2021-08-23 NOTE — Telephone Encounter (Signed)
Patient calling in to check status of refill request  Also requesting that it be written for a 90DS  Patient says she only has 3-4 tablets left

## 2021-09-06 ENCOUNTER — Ambulatory Visit: Payer: Medicare Other | Admitting: Nurse Practitioner

## 2021-09-23 ENCOUNTER — Telehealth: Payer: Self-pay | Admitting: Nurse Practitioner

## 2021-09-23 NOTE — Telephone Encounter (Signed)
Connected to [facility] 2.19.2023.  Caller states she has a cough, congestion, and sinus drainage since Thursday. Temp 98.4 oral.  Advised Home Care.

## 2021-09-23 NOTE — Telephone Encounter (Signed)
FYI

## 2021-10-04 NOTE — Telephone Encounter (Signed)
NOTE NOT NEEDED ?

## 2021-10-08 DIAGNOSIS — H0102B Squamous blepharitis left eye, upper and lower eyelids: Secondary | ICD-10-CM | POA: Diagnosis not present

## 2021-10-08 DIAGNOSIS — H00024 Hordeolum internum left upper eyelid: Secondary | ICD-10-CM | POA: Diagnosis not present

## 2021-10-08 DIAGNOSIS — H0102A Squamous blepharitis right eye, upper and lower eyelids: Secondary | ICD-10-CM | POA: Diagnosis not present

## 2021-10-09 ENCOUNTER — Ambulatory Visit: Payer: Medicare Other | Admitting: Podiatry

## 2021-10-09 ENCOUNTER — Other Ambulatory Visit: Payer: Self-pay

## 2021-10-09 DIAGNOSIS — M722 Plantar fascial fibromatosis: Secondary | ICD-10-CM

## 2021-10-09 MED ORDER — BETAMETHASONE SOD PHOS & ACET 6 (3-3) MG/ML IJ SUSP
3.0000 mg | Freq: Once | INTRAMUSCULAR | Status: AC
  Administered 2021-10-09: 3 mg via INTRA_ARTICULAR

## 2021-10-09 NOTE — Progress Notes (Signed)
? ?  Subjective: ?86 y.o. female presenting today for evaluation of bilateral heel pain.  Patient is still having reservations about the surgery.  She has chronic bilateral plantar fasciitis.  She presents today to have injections because she is having an acute flareup of pain.  She presents for further treatment evaluation.   ? ? ?Past Medical History:  ?Diagnosis Date  ? Anxiety   ? Hypertension   ? ?Past Surgical History:  ?Procedure Laterality Date  ? ABDOMINAL HYSTERECTOMY    ? APPENDECTOMY    ? FOOT SURGERY    ? HEEL SPUR SURGERY    ? TONSILLECTOMY    ? ?No Known Allergies ? ? ?Objective: ?Physical Exam ?General: The patient is alert and oriented x3 in no acute distress. ? ?Dermatology: Skin is warm, dry and supple bilateral lower extremities. Negative for open lesions or macerations bilateral.  Hyperkeratotic elongated nails noted with curvature of the bilateral great toenails to the bilateral feet 1-5 ? ?Vascular: Dorsalis Pedis and Posterior Tibial pulses palpable bilateral.  Capillary fill time is immediate to all digits. ? ?Neurological: Epicritic and protective threshold intact bilateral.  ? ?Musculoskeletal: There continues to be chronic tenderness to palpation to the plantar aspect of the bilateral heels along the plantar fascia. All other joints range of motion within normal limits bilateral. Strength 5/5 in all groups bilateral.  ? ?MRI impression 08/16/2020 RT foot:  ?Moderate to moderately severe appearing plantar fasciitis of the ?medial cord. There is a small interstitial tear in the plantar ?fascia but no frank rupture. Mild reactive marrow edema in the ?calcaneus at the plantar fascia attachment noted. ? ?MRI impression 08/16/2020 LT foot:  ?Mild plantar fasciitis. No plantar fascial tear. ? ?Assessment: ?1. Plantar fasciitis bilateral ?2.  History of plantar fasciotomy approximately 10+ years ago ? ?Plan of Care:  ?1. Patient evaluated. ?2.  Patient requested injections today.  Injection of 0.5  cc Celestone Soluspan injected in the bilateral plantar fascia ?3.  Continue wearing good supportive shoes and sneakers and conservative care for her chronic plantar fasciitis since she does not want to pursue any surgery ?4.  Return to clinic as needed ? ?Felecia Shelling, DPM ?[facility] ? ?Dr. Felecia Shelling, DPM  ?  ?[address]                                        ?[place], [place] [address]                ?Office [phone]  ?Fax [phone] ? ? ? ? ? ?

## 2021-10-14 ENCOUNTER — Ambulatory Visit: Payer: Medicare Other | Admitting: Podiatry

## 2021-10-22 ENCOUNTER — Telehealth: Payer: Self-pay | Admitting: Nurse Practitioner

## 2021-10-22 DIAGNOSIS — I10 Essential (primary) hypertension: Secondary | ICD-10-CM

## 2021-10-22 MED ORDER — ATENOLOL 50 MG PO TABS: ORAL_TABLET | ORAL | 0 refills | Status: DC

## 2021-10-22 NOTE — Telephone Encounter (Signed)
1.Medication Requested: atenolol (TENORMIN) 50 MG tablet ? ? ?2. Pharmacy (Name, Street, [place]): [facility] [phone] - [place], [place] - [address]  ?Phone:  [phone] ?Fax:  [phone] ? ?  ?3. On Med List: yes ? ?4. Last Visit with PCP: 10.07.22 ? ?5. Next visit date with PCP: ? ? ?Agent: Please be advised that RX refills may take up to 3 business days. We ask that you follow-up with your pharmacy.  ?

## 2021-10-22 NOTE — Telephone Encounter (Signed)
30 day Rx sent. Message sent to pharmacy pt needs appt for further refills. ?

## 2021-10-23 DIAGNOSIS — H00024 Hordeolum internum left upper eyelid: Secondary | ICD-10-CM | POA: Diagnosis not present

## 2021-11-13 NOTE — Telephone Encounter (Signed)
error 

## 2021-11-17 ENCOUNTER — Other Ambulatory Visit: Payer: Self-pay | Admitting: Nurse Practitioner

## 2021-11-17 DIAGNOSIS — I10 Essential (primary) hypertension: Secondary | ICD-10-CM

## 2021-11-21 NOTE — Telephone Encounter (Signed)
Pt checking status of refill request ? ?Can you please assist in Sarah's absence  ?

## 2021-11-25 ENCOUNTER — Ambulatory Visit: Payer: Medicare Other | Admitting: Podiatry

## 2021-11-25 DIAGNOSIS — M722 Plantar fascial fibromatosis: Secondary | ICD-10-CM | POA: Diagnosis not present

## 2021-11-25 DIAGNOSIS — M79674 Pain in right toe(s): Secondary | ICD-10-CM | POA: Diagnosis not present

## 2021-11-25 DIAGNOSIS — B351 Tinea unguium: Secondary | ICD-10-CM | POA: Diagnosis not present

## 2021-11-25 DIAGNOSIS — M79675 Pain in left toe(s): Secondary | ICD-10-CM | POA: Diagnosis not present

## 2021-11-25 MED ORDER — BETAMETHASONE SOD PHOS & ACET 6 (3-3) MG/ML IJ SUSP
3.0000 mg | Freq: Once | INTRAMUSCULAR | Status: AC
  Administered 2021-11-25: 3 mg via INTRA_ARTICULAR

## 2021-11-25 NOTE — Progress Notes (Signed)
? ?  Subjective: ?86 y.o. female presenting today for evaluation of bilateral heel pain.  Patient is still having reservations about the surgery.  She has chronic bilateral plantar fasciitis.  Patient is currently having an acute flareup of left heel pain.  ?Patient also requesting nail debridement today.  She says her nails are long and painful and tender and she is unable to trim her own nails.  She presents for further treatment and evaluation ? ? ?Past Medical History:  ?Diagnosis Date  ? Anxiety   ? Hypertension   ? ?Past Surgical History:  ?Procedure Laterality Date  ? ABDOMINAL HYSTERECTOMY    ? APPENDECTOMY    ? FOOT SURGERY    ? HEEL SPUR SURGERY    ? TONSILLECTOMY    ? ?No Known Allergies ? ? ?Objective: ?Physical Exam ?General: The patient is alert and oriented x3 in no acute distress. ? ?Dermatology: Skin is warm, dry and supple bilateral lower extremities. Negative for open lesions or macerations bilateral.  Hyperkeratotic elongated nails noted with curvature of the bilateral great toenails to the bilateral feet 1-5 ? ?Vascular: Dorsalis Pedis and Posterior Tibial pulses palpable bilateral.  Capillary fill time is immediate to all digits. ? ?Neurological: Epicritic and protective threshold intact bilateral.  ? ?Musculoskeletal: There continues to be chronic tenderness to palpation to the plantar aspect of the left heel along the plantar fascia. All other joints range of motion within normal limits bilateral. Strength 5/5 in all groups bilateral.  ? ?MRI impression 08/16/2020 RT foot:  ?Moderate to moderately severe appearing plantar fasciitis of the ?medial cord. There is a small interstitial tear in the plantar ?fascia but no frank rupture. Mild reactive marrow edema in the ?calcaneus at the plantar fascia attachment noted. ? ?MRI impression 08/16/2020 LT foot:  ?Mild plantar fasciitis. No plantar fascial tear. ? ?Assessment: ?1. Plantar fasciitis left ?2.  History of plantar fasciotomy approximately 10+  years ago ?3. Pain due to onychomycosis of toenails bilateral ? ?Plan of Care:  ?1. Patient evaluated. ?2.  Patient requested injections today.  Injection of 0.5 cc Celestone Soluspan injected in the bilateral plantar fascia ?3.  Continue wearing good supportive shoes and sneakers and conservative care for her chronic plantar fasciitis since she does not want to pursue any surgery ?4.  Mechanical debridement of nails 1-5 bilateral performed using a nail nipper without incident or bleeding ?5. Return to clinic as needed ? ?Felecia Shelling, DPM ?[facility] ? ?Dr. Felecia Shelling, DPM  ?  ?[address]                                        ?[place], [place] [address]                ?Office [phone]  ?Fax [phone] ? ? ? ? ? ?

## 2021-12-13 ENCOUNTER — Ambulatory Visit: Payer: Medicare Other | Admitting: Nurse Practitioner

## 2021-12-26 ENCOUNTER — Ambulatory Visit: Payer: Medicare Other | Admitting: Nurse Practitioner

## 2021-12-26 ENCOUNTER — Ambulatory Visit (INDEPENDENT_AMBULATORY_CARE_PROVIDER_SITE_OTHER): Payer: Medicare Other | Admitting: Nurse Practitioner

## 2021-12-26 ENCOUNTER — Encounter: Payer: Self-pay | Admitting: Nurse Practitioner

## 2021-12-26 VITALS — BP 124/78 | HR 69 | Temp 98.5°F | Ht 63.0 in | Wt 174.0 lb

## 2021-12-26 DIAGNOSIS — D649 Anemia, unspecified: Secondary | ICD-10-CM | POA: Diagnosis not present

## 2021-12-26 DIAGNOSIS — R7303 Prediabetes: Secondary | ICD-10-CM | POA: Diagnosis not present

## 2021-12-26 DIAGNOSIS — R7989 Other specified abnormal findings of blood chemistry: Secondary | ICD-10-CM

## 2021-12-26 DIAGNOSIS — N1832 Chronic kidney disease, stage 3b: Secondary | ICD-10-CM | POA: Diagnosis not present

## 2021-12-26 DIAGNOSIS — E049 Nontoxic goiter, unspecified: Secondary | ICD-10-CM | POA: Diagnosis not present

## 2021-12-26 DIAGNOSIS — L508 Other urticaria: Secondary | ICD-10-CM

## 2021-12-26 DIAGNOSIS — E785 Hyperlipidemia, unspecified: Secondary | ICD-10-CM | POA: Insufficient documentation

## 2021-12-26 DIAGNOSIS — I10 Essential (primary) hypertension: Secondary | ICD-10-CM

## 2021-12-26 HISTORY — DX: Other urticaria: L50.8

## 2021-12-26 MED ORDER — AMLODIPINE BESYLATE 2.5 MG PO TABS: 2.5000 mg | ORAL_TABLET | Freq: Every day | ORAL | 2 refills | Status: DC

## 2021-12-26 MED ORDER — ATENOLOL 50 MG PO TABS: 50.0000 mg | ORAL_TABLET | Freq: Every day | ORAL | 2 refills | Status: DC

## 2021-12-26 NOTE — Assessment & Plan Note (Signed)
Chronic, stable.  Likely related to her chronic kidney disease.  Will order CBC, ferritin, iron, folate, B12, and reticulocyte count for further evaluation today.  Further recommendations may be made based upon these results.

## 2021-12-26 NOTE — Assessment & Plan Note (Signed)
Chronic, stable.  Would like to repeat metabolic panel as well as check for albuminuria.  Patient educated on chronic kidney disease and that I highly recommend she follow-up with nephrology.  She tells me she will consider this but does not appear to be willing to do this at this time.

## 2021-12-26 NOTE — Assessment & Plan Note (Signed)
Appears to have enlarged thyroid on exam today.  Will order repeat thyroid function testing as well as ultrasound of thyroid for further evaluation.  Further recommendations may be made based upon these results.

## 2021-12-26 NOTE — Assessment & Plan Note (Signed)
Chronic, well controlled currently.  Patient to continue on amlodipine 2.5 mg/day, atenolol 50 mg/day, and hydralazine 25 mg twice a day.

## 2021-12-26 NOTE — Patient Instructions (Signed)
Chronic Kidney Disease, Adult Chronic kidney disease (CKD) occurs when the kidneys are slowly and permanently damaged over a long period of time. The kidneys are a pair of organs that do many important jobs in the body, including: Removing waste and extra fluid from the blood to make urine. Making hormones that maintain the amount of fluid in tissues and blood vessels. Maintaining the right amount of fluids and chemicals in the body. A small amount of kidney damage may not cause problems, but a large amount of damage may make it hard or impossible for the kidneys to work right. Steps must be taken to slow kidney damage or to stop it from getting worse. If steps are not taken, the kidneys may stop working permanently (end-stage renal disease, or ESRD). Most of the time, CKD does not go away, but it can often be controlled. People who have CKD are usually able to live full lives. What are the causes? The most common causes of this condition are diabetes and high blood pressure (hypertension). Other causes include: Cardiovascular diseases. These affect the heart and blood vessels. Kidney diseases. These include: Glomerulonephritis, or inflammation of the tiny filters in the kidneys. Interstitial nephritis. This is swelling of the small tubes of the kidneys and of the surrounding structures. Polycystic kidney disease, in which clusters of fluid-filled sacs form within the kidneys. Renal vascular disease. This includes disorders that affect the arteries and veins of the kidneys. Diseases that affect the body's defense system (immune system). A problem with urine flow. This may be caused by: Kidney stones. Cancer. An enlarged prostate, in males. A kidney infection or urinary tract infection (UTI) that keeps coming back. Vasculitis. This is swelling or inflammation of the blood vessels. What increases the risk? Your chances of having kidney disease increase with age. The following factors may make  you more likely to develop this condition: A family history of kidney disease or kidney failure. Kidney failure means the kidneys can no longer work right. Certain genetic diseases. Taking medicines often that are damaging to the kidneys. Being around or being in contact with toxic substances. Obesity. A history of tobacco use. What are the signs or symptoms? Symptoms of this condition include: Feeling very tired (lethargic) and having less energy. Swelling, or edema, of the face, legs, ankles, or feet. Nausea or vomiting, or loss of appetite. Confusion or trouble concentrating. Muscle twitches and cramps, especially in the legs. Dry, itchy skin. A metallic taste in the mouth. Producing less urine, or producing more urine (especially at night). Shortness of breath. Trouble sleeping. CKD may also result in not having enough red blood cells or hemoglobin in the blood (anemia) or having weak bones (bone disease). Symptoms develop slowly and may not be obvious until the kidney damage becomes severe. It is possible to have kidney disease for years without having symptoms. How is this diagnosed? This condition may be diagnosed based on: Blood tests. Urine tests. Imaging tests, such as an ultrasound or a CT scan. A kidney biopsy. This involves removing a sample of kidney tissue to be looked at under a microscope. Results from these tests will help to determine how serious the CKD is. How is this treated? There is no cure for most cases of this condition, but treatment usually relieves symptoms and prevents or slows the worsening of the disease. Treatment may include: Diet changes, which may require you to avoid alcohol and foods that are high in salt, potassium, phosphorous, and protein. Medicines. These may:   Lower blood pressure. Control blood sugar (glucose). Relieve anemia. Relieve swelling. Protect your bones. Improve the balance of salts and minerals in your blood  (electrolytes). Dialysis, which is a type of treatment that removes toxic waste from the body. It may be needed if you have kidney failure. Managing any other conditions that are causing your CKD or making it worse. Follow these instructions at home: Medicines Take over-the-counter and prescription medicines only as told by your health care provider. The amount of some medicines that you take may need to be changed. Do not take any new medicines unless approved by your health care provider. Many medicines can make kidney damage worse. Do not take any vitamin and mineral supplements unless approved by your health care provider. Many nutritional supplements can make kidney damage worse. Lifestyle  Do not use any products that contain nicotine or tobacco, such as cigarettes, e-cigarettes, and chewing tobacco. If you need help quitting, ask your health care provider. If you drink alcohol: Limit how much you use to: 0-1 drink a day for women who are not pregnant. 0-2 drinks a day for men. Know how much alcohol is in your drink. In the [place] one drink equals one 12 oz bottle of beer (355 mL), one 5 oz glass of wine (148 mL), or one 1 oz glass of hard liquor (44 mL). Maintain a healthy weight. If you need help, ask your health care provider. General instructions  Follow instructions from your health care provider about eating or drinking restrictions, including any prescribed diet. Track your blood pressure at home. Report changes in your blood pressure as told. If you are being treated for diabetes, track your blood glucose levels as told. Start or continue an exercise plan. Exercise at least 30 minutes a day, 5 days a week. Keep your immunizations up to date as told. Keep all follow-up visits. This is important. Where to find more information American Association of Kidney Patients: www.aakp.org National Kidney Foundation: www.kidney.org American Kidney Fund: www.akfinc.org Life Options:  www.lifeoptions.org Kidney School: www.kidneyschool.org Contact a health care provider if: Your symptoms get worse. You develop new symptoms. Get help right away if: You develop symptoms of ESRD. These include: Headaches. Numbness in your hands or feet. Easy bruising. Frequent hiccups. Chest pain. Shortness of breath. Lack of menstrual periods, in women. You have a fever. You are producing less urine than usual. You have pain or bleeding when you urinate or when you have a bowel movement. These symptoms may represent a serious problem that is an emergency. Do not wait to see if the symptoms will go away. Get medical help right away. Call your local emergency services (911 in the [place] Do not drive yourself to the hospital. Summary Chronic kidney disease (CKD) occurs when the kidneys become damaged slowly over a long period of time. The most common causes of this condition are diabetes and high blood pressure (hypertension). There is no cure for most cases of CKD, but treatment usually relieves symptoms and prevents or slows the worsening of the disease. Treatment may include a combination of lifestyle changes, medicines, and dialysis. This information is not intended to replace advice given to you by your health care provider. Make sure you discuss any questions you have with your health care provider. Document Revised: 10/26/2019 Document Reviewed: 10/26/2019 Elsevier Patient Education  2023 Elsevier Inc.  

## 2021-12-26 NOTE — Progress Notes (Signed)
Established Patient Office Visit  Subjective   Patient ID: Megan Robbins, female    DOB: 1935-10-11  Age: 86 y.o. MRN: [id number]  Chief Complaint  Patient presents with   Follow-up   Chronic Kidney Disease    Patient arrives today company by her husband.  She is here to discuss lab work from last office visit which was her initial visit with me.  This did show chronic kidney disease with GFR of 31, hyperlipidemia, anemia with elevated RDW, suppressed TSH at 0.10, and prediabetes with A1c of 6.3.  She is very pleasant today, however reports that she is unwilling to have blood work drawn today.  Additionally, she is unwilling to see nephrology at this time as previously recommended.  She is also hesitant and reluctant to come back to the office every 3 months but is willing to be evaluated again in 6 months.  Goiter/low TSH: She reports that she is aware of her goiter but has not had this further evaluated.  She denies chest pain, fatigue, shortness of breath, cardiac palpitations.  Chronic kidney disease stage IIIb: EGFR 31.14, per chart review in 2018 GFR was 37.28.  She was referred to nephrology but spoke to the office and decided she did not want to be evaluated as of now.  Hypertension: She continues on and continues to tolerate amlodipine 2.5 mg/day, atenolol 50 mg/day, and hydralazine 25 mg/BID.  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  She reports that her stress level has improved immensely since she is no longer at the main caregiver.  She feels that this has resulted in better controlled blood pressure.  Anemia: She reports a long history of anemia.  Most recent CBC showed hemoglobin of 11.5, with RDW slightly elevated at 17.1.  White blood cells, red blood cells, and MCV all within normal limits.  Platelets also normal at 227.  She denies significant fatigue.  Prediabetes: A1c of 6.3.  She reports that she avoids sugary  beverages and bread.  Past Medical History:  Diagnosis Date   Anxiety    Hypertension       Review of Systems  Constitutional:  Negative for fever, malaise/fatigue and weight loss.  Respiratory:  Negative for shortness of breath.   Cardiovascular:  Negative for chest pain and palpitations.     Objective:     BP 124/78 (BP Location: Left Arm, Patient Position: Sitting, Cuff Size: Large)   Pulse 69   Temp 98.5 F (36.9 C) (Oral)   Ht 5' 3" (1.6 m)   Wt 174 lb (78.9 kg)   SpO2 98%   BMI 30.82 kg/m  BP Readings from Last 3 Encounters:  12/26/21 124/78  06/14/21 130/82  05/10/21 136/84   Wt Readings from Last 3 Encounters:  12/26/21 174 lb (78.9 kg)  06/14/21 170 lb (77.1 kg)  05/10/21 168 lb (76.2 kg)      Physical Exam Vitals reviewed.  Constitutional:      General: She is not in acute distress.    Appearance: Normal appearance.  HENT:     Head: Normocephalic and atraumatic.  Neck:     Thyroid: Thyromegaly present.     Vascular: No carotid bruit.  Cardiovascular:     Rate and Rhythm: Normal rate and regular rhythm.     Pulses: Normal pulses.     Heart sounds: Normal heart sounds.  Pulmonary:     Effort: Pulmonary effort is  normal.     Breath sounds: Normal breath sounds.  Skin:    General: Skin is warm and dry.  Neurological:     General: No focal deficit present.     Mental Status: She is alert and oriented to person, place, and time.  Psychiatric:        Mood and Affect: Mood normal.        Behavior: Behavior normal.        Judgment: Judgment normal.     No results found for any visits on 12/26/21.  Last CBC Lab Results  Component Value Date   WBC 6.5 05/10/2021   HGB 11.5 (L) 05/10/2021   HCT 35.3 (L) 05/10/2021   MCV 88.5 05/10/2021   RDW 17.1 (H) 05/10/2021   PLT 227.0 05/10/2021   Last metabolic panel Lab Results  Component Value Date   GLUCOSE 94 05/10/2021   NA 140 05/10/2021   K 3.8 05/10/2021   CL 105 05/10/2021   CO2 24  05/10/2021   BUN 24 (H) 05/10/2021   CREATININE 1.54 (H) 05/10/2021   CALCIUM 9.3 05/10/2021   PROT 7.4 05/10/2021   ALBUMIN 4.1 05/10/2021   BILITOT 0.7 05/10/2021   ALKPHOS 40 05/10/2021   AST 14 05/10/2021   ALT 9 05/10/2021   Last lipids Lab Results  Component Value Date   CHOL 267 (H) 05/10/2021   HDL 69.40 05/10/2021   LDLCALC 181 (H) 05/10/2021   TRIG 81.0 05/10/2021   CHOLHDL 4 05/10/2021   Last hemoglobin A1c Lab Results  Component Value Date   HGBA1C 6.3 05/10/2021   Last thyroid functions Lab Results  Component Value Date   TSH 0.10 (L) 05/10/2021      The ASCVD Risk score (Arnett DK, et al., 2019) failed to calculate for the following reasons:   The 2019 ASCVD risk score is only valid for ages 40 to 79    Assessment & Plan:   Problem List Items Addressed This Visit       Cardiovascular and Mediastinum   Essential hypertension   Relevant Medications   amLODipine (NORVASC) 2.5 MG tablet   atenolol (TENORMIN) 50 MG tablet   Other Relevant Orders   Comprehensive metabolic panel   Microalbumin / creatinine urine ratio   TSH   T3, free   T4, free   CBC   Pathologist smear review   Reticulocytes   Ferritin   Iron   Folate   Vitamin B12     Endocrine   Goiter   Relevant Medications   atenolol (TENORMIN) 50 MG tablet   Other Relevant Orders   US THYROID     Genitourinary   Stage 3b chronic kidney disease (HCC)   Relevant Medications   amLODipine (NORVASC) 2.5 MG tablet   atenolol (TENORMIN) 50 MG tablet   Other Relevant Orders   Comprehensive metabolic panel   Microalbumin / creatinine urine ratio   TSH   T3, free   T4, free   CBC   Pathologist smear review   Reticulocytes   Ferritin   Iron   Folate   Vitamin B12     Other   Low TSH level - Primary   Relevant Medications   amLODipine (NORVASC) 2.5 MG tablet   atenolol (TENORMIN) 50 MG tablet   Other Relevant Orders   Comprehensive metabolic panel   Microalbumin /  creatinine urine ratio   TSH   T3, free   T4, free   CBC   Pathologist smear   review   Reticulocytes   Ferritin   Iron   Folate   Vitamin B12   US THYROID   Anemia   Relevant Medications   amLODipine (NORVASC) 2.5 MG tablet   atenolol (TENORMIN) 50 MG tablet   Other Relevant Orders   Comprehensive metabolic panel   Microalbumin / creatinine urine ratio   TSH   T3, free   T4, free   CBC   Pathologist smear review   Reticulocytes   Ferritin   Iron   Folate   Vitamin B12   Hyperlipidemia   Relevant Medications   amLODipine (NORVASC) 2.5 MG tablet   atenolol (TENORMIN) 50 MG tablet    Labs ordered today for further evaluation of conditions as listed above.  Patient unwilling to have blood work drawn today.  She was encouraged to come back as soon as possible for her blood draw.  She reports her understanding, but is unclear regarding how soon she is willing to come in to have labs drawn.  Orders placed today.  Return for 6 months annual physical with sarah and LAB VISIT AS SOON AS POSSIBLE.    SARAH E GRAY, NP  

## 2021-12-26 NOTE — Assessment & Plan Note (Signed)
A1c of 6.3.  For now focus on lifestyle modification and patient encouraged to follow a healthy diet specifically Mediterranean diet.  Patient reports her understanding.

## 2021-12-26 NOTE — Assessment & Plan Note (Signed)
Chronic, unfortunately we were not able to discuss in detail today.  Patient encouraged to follow a healthy diet specifically focus on Mediterranean diet.  Would like to see her sooner than 6 months, however patient prefers to wait 6 months before next follow-up.  We will consider rechecking lipid panel and consider initiating statin therapy if cholesterol remains elevated at next follow-up.

## 2021-12-27 ENCOUNTER — Ambulatory Visit: Payer: Self-pay

## 2021-12-27 ENCOUNTER — Telehealth: Payer: Medicare Other | Admitting: Nurse Practitioner

## 2021-12-27 DIAGNOSIS — E785 Hyperlipidemia, unspecified: Secondary | ICD-10-CM

## 2021-12-27 NOTE — Progress Notes (Signed)
Patient called today to discuss her hyperlipidemia.  Unfortunately we were unable to discuss this in great detail when I saw her last.  I informed her that her LDL is 181.  We discussed risk and benefits of starting medication versus focusing on lifestyle modifications aimed at improving her cholesterol.  Based on her age and her willingness to make lifestyle modifications per shared decision making she will focus first on diet and increasing physical activity as tolerated.  We will recheck lipid panel when she follows up at next office visit, if still elevated may consider statin versus Zetia.  Patient educated to call [phone] if she experiences signs or symptoms of heart attack and/or stroke.

## 2021-12-27 NOTE — Telephone Encounter (Signed)
  Chief Complaint: Question about high cholesterol Symptoms:  Frequency:  Pertinent Negatives: Patient denies  Disposition: [] ED /[] Urgent Care (no appt availability in office) / [] Appointment(In office/virtual)/ []  [facility]/ [] Home Care/ [] Refused Recommended Disposition /[] [facility]/ [x]  Follow-up with PCP Additional Notes: Pt was calling because she was told that her cholesterol was high and she was upset.  Assure pt that her provider would find a medication that would help with high cholesterol. And that provider may ask her to make some dietary or lifestyle changes to help with controlling cholesterol. Pt felt better after our conversation.     Summary: advice - cholesterol   Pt has PCP and appt today 5/26 at 5pm to discuss her cholesterol concerns, pt was still connected to [facility] to talk to NT to discuss her concerns. Please advise.      Reason for Disposition  Health Information question, no triage required and triager able to answer question  Answer Assessment - Initial Assessment Questions 1. REASON FOR CALL or QUESTION: "What is your reason for calling today?" or "How can I best help you?" or "What question do you have that I can help answer?"     Pt was calling because she was told that her cholesterol was high and she was upset.  Assure pt that her provider would find a medication that would help with high cholesterol. And that provider may ask her to make some dietary or lifestyle changes to help with controlling cholesterol. Pt felt better after our conversation.  Protocols used: Information Only Call - No Triage-A-AH

## 2021-12-30 ENCOUNTER — Ambulatory Visit (HOSPITAL_COMMUNITY)
Admission: EM | Admit: 2021-12-30 | Discharge: 2021-12-30 | Disposition: A | Discharge status: Home / Self Care | Payer: Medicare Other | Attending: Family Medicine | Admitting: Family Medicine

## 2021-12-30 ENCOUNTER — Encounter (HOSPITAL_COMMUNITY): Payer: Self-pay | Admitting: *Deleted

## 2021-12-30 DIAGNOSIS — K529 Noninfective gastroenteritis and colitis, unspecified: Secondary | ICD-10-CM

## 2021-12-30 NOTE — Discharge Instructions (Signed)
Eat bland things and drink plenty of fluids.

## 2021-12-30 NOTE — ED Provider Notes (Signed)
[place]    CSN: [id number] Arrival date & time: 12/30/21  1451      History   Chief Complaint No chief complaint on file.   HPI Megan Robbins is a 86 y.o. female.   HPI Here for diarrhea  On May 27 that she had very frequent diarrhea throughout the day.  Then yesterday she did not have any bowel movements.  Today she has had 2 or 3 soft bowel movements.  No nausea or vomiting.  She has had a little bit of a bitter taste in her mouth.  She actually has a good appetite and would like to be eating, but family has warned her off eating too much when she is having diarrhea.  No fever or chills.  She has had some suprapubic pain that is mainly when she moves or strains in her bowel movement.  No blood in the stool.  No antibiotics in the last 2 or 3 months.  She takes medicine for blood pressure  Past Medical History:  Diagnosis Date   Anxiety    Hypertension     Patient Active Problem List   Diagnosis Date Noted   Acute urticaria 12/26/2021   Low TSH level 12/26/2021   Stage 3b chronic kidney disease ([place]) 12/26/2021   Anemia 12/26/2021   Goiter 12/26/2021   Hyperlipidemia 12/26/2021   Prediabetes 12/26/2021   Dysuria 06/14/2021   Bronchitis due to COVID-19 virus 03/11/2021   Low back strain, initial encounter 02/22/2021   Rash 06/23/2019   Eustachian tube dysfunction, right 04/01/2018   Impacted cerumen of left ear 04/01/2018   Seasonal allergic rhinitis 04/01/2018   Function kidney decreased 05/13/2017   Compression fracture of L3 vertebra ([place]) 05/13/2017   Mid back pain 05/08/2017   Stress and adjustment reaction 05/08/2017   Overweight 05/23/2015   Essential hypertension 05/08/2015    Past Surgical History:  Procedure Laterality Date   ABDOMINAL HYSTERECTOMY     APPENDECTOMY     FOOT SURGERY     HEEL SPUR SURGERY     TONSILLECTOMY      OB History   No obstetric history on file.      Home Medications    Prior to Admission  medications   Medication Sig Start Date End Date Taking? Authorizing Provider  amLODipine (NORVASC) 2.5 MG tablet Take 1 tablet (2.5 mg total) by mouth daily. 12/26/21  Yes Ailene Ards, NP  atenolol (TENORMIN) 50 MG tablet Take 1 tablet (50 mg total) by mouth daily. TAKE 1 TABLET(50 MG) BY MOUTH DAILY 12/26/21  Yes Ailene Ards, NP  hydrALAZINE (APRESOLINE) 25 MG tablet TAKE 1 TABLET(25 MG) BY MOUTH TWICE DAILY 11/21/21  Yes Ailene Ards, NP  ALPRAZolam Duanne Moron) 0.5 MG tablet Take by mouth. 07/15/21   [provider]  fexofenadine (ALLEGRA) 180 MG tablet Take 1 tablet (180 mg total) by mouth daily. 06/23/19   Biagio Borg, MD  fluticasone (FLONASE) 50 MCG/ACT nasal spray Place 1 spray into both nostrils daily. 04/14/18   Kozlow, Donnamarie Poag, MD  Lidocaine (HM LIDOCAINE PATCH) 4 % PTCH Apply 1 each topically daily. Remove after 12 hours each day. 04/06/20   Lyndee Hensen, DO  meclizine (ANTIVERT) 25 MG tablet Take 1 tablet (25 mg total) by mouth 3 (three) times daily as needed for dizziness. 04/20/20   Marrian Salvage, FNP  ofloxacin (OCUFLOX) 0.3 % ophthalmic solution Place 1 drop into the left eye 4 (four) times daily. 10/23/21  [provider]  penicillin v potassium (VEETID) 500 MG tablet Take 500 mg by mouth 4 (four) times daily. 07/12/21   [provider]    Family History Family History  Problem Relation Age of Onset   Hyperlipidemia Mother    Hypertension Mother    Diabetes Daughter    Diabetes Son     Social History Social History   Tobacco Use   Smoking status: Never   Smokeless tobacco: Never  Vaping Use   Vaping Use: Never used  Substance Use Topics   Alcohol use: No   Drug use: Never     Allergies   Patient has no known allergies.   Review of Systems Review of Systems   Physical Exam Triage Vital Signs ED Triage Vitals  Enc Vitals Group     BP 12/30/21 1508 (!) 161/83     Pulse Rate 12/30/21 1508 71     Resp 12/30/21 1508 (!)  24     Temp 12/30/21 1508 98.5 F (36.9 C)     Temp Source 12/30/21 1508 Oral     SpO2 12/30/21 1508 97 %     Weight --      Height --      Head Circumference --      Peak Flow --      Pain Score 12/30/21 1511 0     Pain Loc --      Pain Edu? --      Excl. in [place]? --    No data found.  Updated Vital Signs BP (!) 161/83   Pulse 71   Temp 98.5 F (36.9 C) (Oral)   Resp (!) 24   SpO2 97%   Visual Acuity Right Eye Distance:   Left Eye Distance:   Bilateral Distance:    Right Eye Near:   Left Eye Near:    Bilateral Near:     Physical Exam Vitals reviewed.  Constitutional:      General: She is not in acute distress.    Appearance: She is not toxic-appearing.  HENT:     Nose: Nose normal.     Mouth/Throat:     Mouth: Mucous membranes are moist.     Pharynx: No oropharyngeal exudate or posterior oropharyngeal erythema.  Eyes:     Extraocular Movements: Extraocular movements intact.     Conjunctiva/sclera: Conjunctivae normal.     Pupils: Pupils are equal, round, and reactive to light.  Cardiovascular:     Rate and Rhythm: Normal rate and regular rhythm.     Heart sounds: No murmur heard. Pulmonary:     Effort: Pulmonary effort is normal.     Breath sounds: Normal breath sounds.  Abdominal:     Palpations: Abdomen is soft. There is no mass.     Tenderness: There is no guarding.     Comments: Mild suprapubic tenderness   Skin:    Coloration: Skin is not jaundiced or pale.  Neurological:     General: No focal deficit present.     Mental Status: She is oriented to person, place, and time.  Psychiatric:        Behavior: Behavior normal.     UC Treatments / Results  Labs (all labs ordered are listed, but only abnormal results are displayed) Labs Reviewed - No data to display  EKG   Radiology No results found.  Procedures Procedures (including critical care time)  Medications Ordered in UC Medications - No data to display  Initial Impression /  Assessment and Plan / UC Course  I have reviewed the triage vital signs and the nursing notes.  Pertinent labs & imaging results that were available during my care of the patient were reviewed by me and considered in my medical decision making (see chart for details).     Discussed this most likely was a viral illness that is resolving.  Vital signs are reassuring, and I have asked her to follow-up with her PCP in 2 to 3 weeks if symptoms are not completely resolved Final Clinical Impressions(s) / UC Diagnoses   Final diagnoses:  Gastroenteritis     Discharge Instructions      Eat bland things and drink plenty of fluids.      ED Prescriptions   None    PDMP not reviewed this encounter.   Barrett Henle, MD 12/30/21 [phone]

## 2021-12-30 NOTE — ED Triage Notes (Signed)
States 2 nights ago started with abd cramping and diarrhea. States yesterday felt "pretty good" without any diarrhea. This AM had normal BM, but throughout today has had some looser BMS than normal, but not diarrhea. C/O bitter taste in mouth and poor appetite, but denies any vomiting or fevers. C/O suprapubic abd pain only when she has BM. Denies any blood in stool.

## 2022-01-03 ENCOUNTER — Emergency Department (HOSPITAL_COMMUNITY)
Admission: EM | Admit: 2022-01-03 | Discharge: 2022-01-03 | Disposition: A | Discharge status: Home / Self Care | Payer: Medicare Other | Attending: Emergency Medicine | Admitting: Emergency Medicine

## 2022-01-03 ENCOUNTER — Encounter (HOSPITAL_COMMUNITY): Payer: Self-pay

## 2022-01-03 ENCOUNTER — Other Ambulatory Visit: Payer: Self-pay

## 2022-01-03 DIAGNOSIS — M542 Cervicalgia: Secondary | ICD-10-CM | POA: Diagnosis not present

## 2022-01-03 MED ORDER — ACETAMINOPHEN 325 MG PO TABS
650.0000 mg | ORAL_TABLET | Freq: Once | ORAL | Status: AC
  Administered 2022-01-03: 650 mg via ORAL
  Filled 2022-01-03: qty 2

## 2022-01-03 MED ORDER — METHOCARBAMOL 500 MG PO TABS: 500.0000 mg | ORAL_TABLET | Freq: Two times a day (BID) | ORAL | 0 refills | Status: DC

## 2022-01-03 MED ORDER — LIDOCAINE 5 % EX PTCH
1.0000 | MEDICATED_PATCH | CUTANEOUS | Status: DC
  Administered 2022-01-03: 1 via TRANSDERMAL
  Filled 2022-01-03: qty 1

## 2022-01-03 MED ORDER — IBUPROFEN 200 MG PO TABS
600.0000 mg | ORAL_TABLET | Freq: Once | ORAL | Status: DC
  Filled 2022-01-03: qty 3

## 2022-01-03 NOTE — Discharge Instructions (Addendum)
Please use Tylenol or ibuprofen for pain.  You may use 600 mg ibuprofen every 6 hours or 1000 mg of Tylenol every 6 hours.  You may choose to alternate between the 2.  This would be most effective.  Not to exceed 4 g of Tylenol within 24 hours.  Not to exceed 3200 mg ibuprofen 24 hours.  You can use the muscle relaxant I am prescribing in addition to the above for any breakthrough pain.  Please try some of the exercises that I have attached to help with the pain.  You can also use some ice or heat as well.

## 2022-01-03 NOTE — ED Provider Notes (Signed)
[facility] DEPT Provider Note   CSN: [id number] Arrival date & time: 01/03/22  1946     History  Chief Complaint  Patient presents with   Neck Pain    Megan Robbins is a 86 y.o. female with noncontributory past medical history who presents with concern for neck pain worse on the right than the left after sleeping on the wrong side of the bed last week.  Patient has tried nothing for pain at this time.  She denies any numbness, tingling, groin tingling or numbness, history of IV drug use, chronic corticosteroid use, history of cancer, recent fever.  She denies any fall or other injury other than sleeping wrong.   Neck Pain     Home Medications Prior to Admission medications   Medication Sig Start Date End Date Taking? Authorizing Provider  methocarbamol (ROBAXIN) 500 MG tablet Take 1 tablet (500 mg total) by mouth 2 (two) times daily. 01/03/22  Yes Lakshya Mcgillicuddy H, PA-C  ALPRAZolam Duanne Moron) 0.5 MG tablet Take by mouth. 07/15/21   [provider]  amLODipine (NORVASC) 2.5 MG tablet Take 1 tablet (2.5 mg total) by mouth daily. 12/26/21   Ailene Ards, NP  atenolol (TENORMIN) 50 MG tablet Take 1 tablet (50 mg total) by mouth daily. TAKE 1 TABLET(50 MG) BY MOUTH DAILY 12/26/21   Ailene Ards, NP  fexofenadine (ALLEGRA) 180 MG tablet Take 1 tablet (180 mg total) by mouth daily. 06/23/19   Biagio Borg, MD  fluticasone (FLONASE) 50 MCG/ACT nasal spray Place 1 spray into both nostrils daily. 04/14/18   Kozlow, Donnamarie Poag, MD  hydrALAZINE (APRESOLINE) 25 MG tablet TAKE 1 TABLET(25 MG) BY MOUTH TWICE DAILY 11/21/21   Ailene Ards, NP  Lidocaine (HM LIDOCAINE PATCH) 4 % PTCH Apply 1 each topically daily. Remove after 12 hours each day. 04/06/20   Lyndee Hensen, DO  meclizine (ANTIVERT) 25 MG tablet Take 1 tablet (25 mg total) by mouth 3 (three) times daily as needed for dizziness. 04/20/20   Marrian Salvage, FNP  ofloxacin (OCUFLOX) 0.3 %  ophthalmic solution Place 1 drop into the left eye 4 (four) times daily. 10/23/21   [provider]  penicillin v potassium (VEETID) 500 MG tablet Take 500 mg by mouth 4 (four) times daily. 07/12/21   [provider]      Allergies    Patient has no known allergies.    Review of Systems   Review of Systems  Musculoskeletal:  Positive for neck pain.  All other systems reviewed and are negative.  Physical Exam Updated Vital Signs BP (!) 171/115 (BP Location: Right Arm)   Pulse 75   Temp 98.2 F (36.8 C) (Oral)   Resp 18   Ht 5\' 3"  (1.6 m)   Wt 78.9 kg   SpO2 98%   BMI 30.82 kg/m  Physical Exam Vitals and nursing note reviewed.  Constitutional:      General: She is not in acute distress.    Appearance: Normal appearance.  HENT:     Head: Normocephalic and atraumatic.  Eyes:     General:        Right eye: No discharge.        Left eye: No discharge.  Cardiovascular:     Rate and Rhythm: Normal rate and regular rhythm.  Pulmonary:     Effort: Pulmonary effort is normal. No respiratory distress.  Musculoskeletal:        General: No deformity.  Comments: No tenderness to palpation of the midline spine.  Tenderness to palpation of the right paraspinous muscles in the cervical spine.  Intact strength 5 out of 5 of bilateral upper and lower extremities.  Skin:    General: Skin is warm and dry.  Neurological:     Mental Status: She is alert and oriented to person, place, and time.  Psychiatric:        Mood and Affect: Mood normal.        Behavior: Behavior normal.    ED Results / Procedures / Treatments   Labs (all labs ordered are listed, but only abnormal results are displayed) Labs Reviewed - No data to display  EKG None  Radiology No results found.  Procedures Procedures    Medications Ordered in ED Medications  ibuprofen (ADVIL) tablet 600 mg (has no administration in time range)  acetaminophen (TYLENOL) tablet 650 mg (has no  administration in time range)  lidocaine (LIDODERM) 5 % 1 patch (has no administration in time range)    ED Course/ Medical Decision Making/ A&P                           Medical Decision Making Risk OTC drugs. Prescription drug management.   Patient with neck pain without injury.  My emergent differential diagnosis includes slipped disc, compression fracture, spondylolisthesis, less clinical concern for epidural abscess or osteomyelitis based on patient history.  No neurological deficits. Patient is ambulatory. No warning symptoms of back pain including: fecal incontinence, urinary retention or overflow incontinence, night sweats, waking from sleep with back pain, unexplained fevers or weight loss, h/o cancer, IVDU, recent trauma. No concern for cauda equina, epidural abscess, or other serious cause of back pain.  Given this work-up, evaluation, physical exam I do not believe that radiographic imaging is indicated at this time.  Conservative measures such as rest, ice/heat, ibuprofen, Tylenol, and  prescription for Robaxin indicated with orthopedic follow-up if no improvement with conservative management.  Extensive return precautions given, patient discharged in stable condition at this time.  Final Clinical Impression(s) / ED Diagnoses Final diagnoses:  Neck pain    Rx / DC Orders ED Discharge Orders          Ordered    methocarbamol (ROBAXIN) 500 MG tablet  2 times daily        01/03/22 2126              Dorien Chihuahua 01/03/22 2128    Lacretia Leigh, MD 01/06/22 1506

## 2022-01-03 NOTE — ED Triage Notes (Signed)
Patient thinks her pillows were too low when she slept 2 nights ago. She usually sleeps with 1 pillow, but that night slept with 2. It hurts on the right side.

## 2022-01-27 ENCOUNTER — Ambulatory Visit: Payer: Medicare Other | Admitting: Podiatry

## 2022-01-27 DIAGNOSIS — B351 Tinea unguium: Secondary | ICD-10-CM

## 2022-01-27 DIAGNOSIS — M79674 Pain in right toe(s): Secondary | ICD-10-CM

## 2022-01-27 DIAGNOSIS — M79675 Pain in left toe(s): Secondary | ICD-10-CM

## 2022-01-27 DIAGNOSIS — M722 Plantar fascial fibromatosis: Secondary | ICD-10-CM | POA: Diagnosis not present

## 2022-01-27 MED ORDER — BETAMETHASONE SOD PHOS & ACET 6 (3-3) MG/ML IJ SUSP
3.0000 mg | Freq: Once | INTRAMUSCULAR | Status: AC
  Administered 2022-01-27: 3 mg via INTRA_ARTICULAR

## 2022-02-17 ENCOUNTER — Ambulatory Visit (INDEPENDENT_AMBULATORY_CARE_PROVIDER_SITE_OTHER): Payer: Medicare Other

## 2022-02-17 ENCOUNTER — Ambulatory Visit: Payer: Medicare Other | Admitting: Podiatry

## 2022-02-17 DIAGNOSIS — S92912A Unspecified fracture of left toe(s), initial encounter for closed fracture: Secondary | ICD-10-CM | POA: Diagnosis not present

## 2022-02-17 NOTE — Progress Notes (Signed)
   HPI: 86 y.o. female presenting today for new complaint of pain to the left fifth digit.  Patient states that on 02/04/2022 she had a party at her home and she hit her left fourth toe on a piece of furniture.  It has been painful and tender ever since.  She has not done anything for treatment.    Past Medical History:  Diagnosis Date   Anxiety    Hypertension     Past Surgical History:  Procedure Laterality Date   ABDOMINAL HYSTERECTOMY     APPENDECTOMY     FOOT SURGERY     HEEL SPUR SURGERY     TONSILLECTOMY      No Known Allergies   Physical Exam: General: The patient is alert and oriented x3 in no acute distress.  Dermatology: Skin is warm, dry and supple bilateral lower extremities. Negative for open lesions or macerations.  Vascular: Palpable pedal pulses bilaterally. Capillary refill within normal limits.  Negative for any significant edema or erythema  Neurological: Light touch and protective threshold grossly intact  Musculoskeletal Exam: No pedal deformities noted.  There is some tenderness to light touch and palpation of the fifth digit left foot  Radiographic Exam LT foot 02/17/2022:  Closed nondisplaced oblique fracture noted to the proximal phalanx of the left fifth digit.  Extra-articular not involving the MTP joint or PIPJ based on x-ray  Assessment: 1.  Fracture proximal phalanx fifth digit LT, closed, nondisplaced, initial encounter   Plan of Care:  1. Patient evaluated. X-Rays reviewed.  2.  Postsurgical shoe dispensed 3.  Explained to the patient that she may have some tenderness of the toe over the next few months while it is healing. 4.  Return to clinic in 8 weeks for follow-up x-ray      Felecia Shelling, DPM [facility]  Dr. Felecia Shelling, DPM    [address] [place], [place] [address]                Office [phone]  Fax [phone]

## 2022-04-14 ENCOUNTER — Ambulatory Visit (INDEPENDENT_AMBULATORY_CARE_PROVIDER_SITE_OTHER): Payer: Medicare Other

## 2022-04-14 ENCOUNTER — Ambulatory Visit: Payer: Medicare Other | Admitting: Podiatry

## 2022-04-14 DIAGNOSIS — S92912A Unspecified fracture of left toe(s), initial encounter for closed fracture: Secondary | ICD-10-CM | POA: Diagnosis not present

## 2022-04-14 DIAGNOSIS — M722 Plantar fascial fibromatosis: Secondary | ICD-10-CM | POA: Diagnosis not present

## 2022-04-14 MED ORDER — BETAMETHASONE SOD PHOS & ACET 6 (3-3) MG/ML IJ SUSP
3.0000 mg | Freq: Once | INTRAMUSCULAR | Status: AC
  Administered 2022-04-14: 3 mg via INTRA_ARTICULAR

## 2022-04-14 NOTE — Progress Notes (Signed)
   HPI: 86 y.o. female presenting today for follow-up evaluation of the fracture to the left fifth toe that was sustained on 02/04/2022 when she went to a party at her home and she hit her left fourth toe on a piece of furniture.  Patient states that she no longer has any pain or tenderness associated to the toe.  She had been wearing the postsurgical shoe up until just today  Patient also presents for chronic management of plantar fasciitis bilateral.  She does not want to have surgery and comes in occasionally for cortisone injections.  She says that recently she has had a flareup of pain and tenderness to the bilateral heels  Past Medical History:  Diagnosis Date   Anxiety    Hypertension     Past Surgical History:  Procedure Laterality Date   ABDOMINAL HYSTERECTOMY     APPENDECTOMY     FOOT SURGERY     HEEL SPUR SURGERY     TONSILLECTOMY      No Known Allergies   Physical Exam: General: The patient is alert and oriented x3 in no acute distress.  Dermatology: Skin is warm, dry and supple bilateral lower extremities. Negative for open lesions or macerations.  Vascular: Palpable pedal pulses bilaterally. Capillary refill within normal limits.  Negative for any significant edema or erythema  Neurological: Light touch and protective threshold grossly intact  Musculoskeletal Exam: No pedal deformities noted.  Chronic pain noted with palpation of the plantar fascia bilateral  Radiographic Exam LT foot 04/14/2022:  Closed nondisplaced oblique fracture noted to the proximal phalanx of the left digit with good routine healing.  Extra-articular not involving the MTP joint or PIPJ based on x-ray  Assessment: 1.  Fracture proximal phalanx digit LT, closed, nondisplaced, initial encounter 2.  Chronic plantar fasciitis bilateral   Plan of Care:  1. Patient evaluated. X-Rays reviewed.  2.  Injury guards to the fracture of the left fourth digit she is doing very well.  She no longer has  pain or tenderness.  She may discontinue the postsurgical shoe.  Recommend good supportive shoes and sneakers 3.  Injection of 0.5 cc Celestone Soluspan injected into the bilateral plantar fascia 4.  Return to clinic as needed      Felecia Shelling, DPM [facility]  Dr. Felecia Shelling, DPM    [address] [place], [place] [address]                Office [phone]  Fax [phone]

## 2022-05-28 ENCOUNTER — Ambulatory Visit: Payer: Medicare Other | Admitting: Podiatry

## 2022-05-28 DIAGNOSIS — R52 Pain, unspecified: Secondary | ICD-10-CM | POA: Diagnosis not present

## 2022-05-28 DIAGNOSIS — L600 Ingrowing nail: Secondary | ICD-10-CM

## 2022-05-28 MED ORDER — GENTAMICIN SULFATE 0.1 % EX CREA: 1.0000 | TOPICAL_CREAM | Freq: Two times a day (BID) | CUTANEOUS | 1 refills | Status: DC

## 2022-05-28 NOTE — Progress Notes (Signed)
   Chief Complaint  Patient presents with   Ingrown Toenail    Patient is here for bilateral ingrown great toe.patient wants injections for foot pain.    Subjective: Patient presents today for new complaint regarding evaluation of pain to the medial border left great toe and lateral border of the right great toe. Patient is concerned for possible ingrown nail.  It is very sensitive to touch.  This has been painful for several months.  She continues to try and trim the nails with no improvement.  Patient presents today for further treatment and evaluation.  Past Medical History:  Diagnosis Date   Anxiety    Hypertension    Past Surgical History:  Procedure Laterality Date   ABDOMINAL HYSTERECTOMY     APPENDECTOMY     FOOT SURGERY     HEEL SPUR SURGERY     TONSILLECTOMY     No Known Allergies  Objective:  General: Well developed, nourished, in no acute distress, alert and oriented x3   Dermatology: Skin is warm, dry and supple bilateral.  Medial border of the left great toe and the lateral border of the right great toe is tender with evidence of an ingrowing nail. Pain on palpation noted to the border of the nail fold. The remaining nails appear unremarkable at this time. There are no open sores, lesions.  Vascular: DP and PT pulses palpable.  No clinical evidence of vascular compromise  Neruologic: Grossly intact via light touch bilateral.  Musculoskeletal: No pedal deformity noted  Assesement: #1 Paronychia with ingrowing nail medial border left great toe lateral border right great toe  Plan of Care:  1. Patient evaluated.  2. Discussed treatment alternatives and plan of care. Explained nail avulsion procedure and post procedure course to patient. 3. Patient opted for permanent partial nail avulsion of the ingrown portion of the nail.  4. Prior to procedure, local anesthesia infiltration utilized using 3 ml of a 50:50 mixture of 2% plain lidocaine and 0.5% plain marcaine in  a normal hallux block fashion and a betadine prep performed.  5. Partial permanent nail avulsion with chemical matrixectomy performed using [id number] applications of phenol followed by alcohol flush.  6. Light dressing applied.  Post care instructions provided 7.  Prescription for gentamicin 2% cream  8.  Return to clinic 2 weeks.  Edrick Kins, DPM [facility]  Dr. Edrick Kins, DPM    [address], [place] [address]                Office [phone]  Fax [phone]

## 2022-06-09 ENCOUNTER — Ambulatory Visit: Payer: Medicare Other | Admitting: Podiatry

## 2022-06-09 DIAGNOSIS — L600 Ingrowing nail: Secondary | ICD-10-CM

## 2022-06-09 DIAGNOSIS — M722 Plantar fascial fibromatosis: Secondary | ICD-10-CM

## 2022-06-09 MED ORDER — BETAMETHASONE SOD PHOS & ACET 6 (3-3) MG/ML IJ SUSP
3.0000 mg | Freq: Once | INTRAMUSCULAR | Status: AC
  Administered 2022-06-09: 3 mg via INTRA_ARTICULAR

## 2022-06-09 NOTE — Progress Notes (Signed)
   Chief Complaint  Patient presents with   Follow-up    Patient is here for bilateral injections and nail trim.    Subjective: 86 y.o. female presents today status post permanent nail avulsion procedure of the medial border left great toe and lateral border right great toe that was performed on 05/28/2022.  Patient states that the toenails are feeling much better.  She has been soaking her feet and applying the antibiotic cream as instructed.  Patient also presents for chronic bilateral heel pain.  Patient does get temporary relief with the cortisone injections.  She does not want to pursue surgery.  She requests routine cortisone injections to alleviate symptoms in her heels..   Past Medical History:  Diagnosis Date   Anxiety    Hypertension     Objective: Neurovascular status intact.  Skin is warm, dry and supple. Nail and respective nail fold appears to be healing appropriately.  There continues to be pain with palpation to the plantar medial aspect of the bilateral heels  Assessment: #1 s/p partial permanent nail matrixectomy medial border LT great toe.  Lateral border RT great toe #2 chronic plantar fasciitis bilateral   Plan of care: #1 patient was evaluated  #2 light debridement of the periungual debris was performed to the border of the respective toe and nail plate using a tissue nipper. #3  Injection of 0.5 cc Celestone Soluspan injected into the plantar fascia bilateral #4 patient is to return to clinic on a PRN basis.   Edrick Kins, DPM [facility]  Dr. Edrick Kins, DPM    [address], [place] [address]                Office [phone]  Fax [phone]

## 2022-07-03 ENCOUNTER — Ambulatory Visit: Payer: Medicare Other | Admitting: Nurse Practitioner

## 2022-07-16 ENCOUNTER — Other Ambulatory Visit: Payer: Self-pay | Admitting: Nurse Practitioner

## 2022-07-16 DIAGNOSIS — I10 Essential (primary) hypertension: Secondary | ICD-10-CM

## 2022-07-24 ENCOUNTER — Ambulatory Visit: Payer: Medicare Other | Admitting: Nurse Practitioner

## 2022-07-24 ENCOUNTER — Other Ambulatory Visit: Payer: Self-pay

## 2022-07-24 ENCOUNTER — Telehealth: Payer: Self-pay | Admitting: Nurse Practitioner

## 2022-07-24 DIAGNOSIS — I10 Essential (primary) hypertension: Secondary | ICD-10-CM

## 2022-07-24 MED ORDER — HYDRALAZINE HCL 25 MG PO TABS: ORAL_TABLET | ORAL | 0 refills | Status: DC

## 2022-07-24 NOTE — Telephone Encounter (Signed)
Patient called requesting a refill on the following medication hydrALAZINE (APRESOLINE) 25 MG tablet to be sent to her pharmacy on file. At least enough medication until her scheduled appt with Maralyn Sago. Best callback number is [phone].

## 2022-07-24 NOTE — Telephone Encounter (Signed)
Medication send in till pt next appointment

## 2022-07-24 NOTE — Progress Notes (Signed)
me and Dahlia Client spoke to pt in regards office policy refill. I let pt know medication can not be refilled if she does come in for her appointment, pt stated she understand the policy, she does not understanding while refill can not be done. Let pt know that based on chart she has been cancelling her appointment that why she need and office for the refill to be done. Called pt back and let her know I can send in enough till her next appointment on the 08/14/22, pt had an attitude so, passed called to [place] who spoke to pt and reconfirm office policy to her and let her know medication is send in till her office, and if she has any further questions, pt stated no she does not.

## 2022-08-14 ENCOUNTER — Ambulatory Visit (INDEPENDENT_AMBULATORY_CARE_PROVIDER_SITE_OTHER): Payer: Medicare Other | Admitting: Nurse Practitioner

## 2022-08-14 VITALS — BP 142/90 | HR 64 | Temp 97.8°F | Ht 63.0 in | Wt 168.0 lb

## 2022-08-14 DIAGNOSIS — R7303 Prediabetes: Secondary | ICD-10-CM

## 2022-08-14 DIAGNOSIS — E049 Nontoxic goiter, unspecified: Secondary | ICD-10-CM | POA: Diagnosis not present

## 2022-08-14 DIAGNOSIS — I10 Essential (primary) hypertension: Secondary | ICD-10-CM | POA: Diagnosis not present

## 2022-08-14 DIAGNOSIS — D649 Anemia, unspecified: Secondary | ICD-10-CM

## 2022-08-14 DIAGNOSIS — Z23 Encounter for immunization: Secondary | ICD-10-CM | POA: Diagnosis not present

## 2022-08-14 DIAGNOSIS — E785 Hyperlipidemia, unspecified: Secondary | ICD-10-CM | POA: Diagnosis not present

## 2022-08-14 NOTE — Assessment & Plan Note (Signed)
Chronic, above goal today.  Patient unwilling to have labs drawn to check for stable kidney function and electrolytes.  Patient educated that she will need labs drawn in order for me to refill her medications.  Patient reports she has enough for a few more weeks and will return to the office another day to have her labs drawn.  No symptoms of chest pain or shortness of breath, not willing to make dosage changes to medications as of right now.

## 2022-08-14 NOTE — Assessment & Plan Note (Signed)
A1c added to lab order for when she returns to the office in a couple of weeks for labs to be drawn.  Further recommendations may be made based upon these results.

## 2022-08-14 NOTE — Assessment & Plan Note (Signed)
Flu and Prevnar 20 administered today, VIS provided.

## 2022-08-14 NOTE — Patient Instructions (Signed)
COME BACK FOR LABS AT YOUR EARLIEST CONVENIENCE. I WILL NEED THESE IN ORDER TO PRESCRIBE REFILLS ON YOUR MEDICATIONS.

## 2022-08-14 NOTE — Assessment & Plan Note (Signed)
Chronic, will add lipid panel to lab orders.  Further recommendations will be made based upon these results.

## 2022-08-14 NOTE — Assessment & Plan Note (Addendum)
Thyroid does appear enlarged on exam still today, I recommended ultrasound of thyroid as well as labs to follow-up incidental finding of suppressed TSH last time labs were drawn over a year ago patient has elected to defer labs and ultrasound.  Patient states she is willing to come in the next couple of weeks to have labs drawn.  Will need to discuss ultrasound again next time she is seen in the office.

## 2022-08-14 NOTE — Assessment & Plan Note (Signed)
Will pend labs for further evaluation for when she comes back to the office in a few weeks to have labs drawn.  Further recommendations may be made based upon these results.

## 2022-08-14 NOTE — Progress Notes (Signed)
Established Patient Office Visit  Subjective   Patient ID: Megan Robbins, female    DOB: 07-Jul-1936  Age: 87 y.o. MRN: [id number]  No chief complaint on file.   Patient arrives today for follow-up of chronic conditions.  She has hypertension for which she takes amlodipine Talal, and hydralazine for.  Goiter was noted on last office visit approximately 6 months ago and was recommended that she have thyroid ultrasound.  Ultrasound was ordered but patient never followed through with having ultrasound completed.  Patient also has anemia and labs were ordered for further evaluation but she never had these completed.  She has prediabetes with last A1c of 6.3.  Patient also has hyperlipidemia with LDL 181, previously had telephone discussion regarding treatment options but patient had elected to focus on lifestyle management alone.  She also has stage IIIb chronic kidney disease, has been referred to nephrology and is not clear whether or not she has been evaluated by them as of yet.  Patient reports today that over the last few months she has had multiple deaths in her family which has resulted in her experiencing significant amount of stress and therefore she has not followed through with treatment recommendations.  Even today, she is not willing to have labs checked nor have ultrasound of thyroid reordered.  She reports that she feels well and is having no worrisome symptoms.       Review of Systems  Constitutional:  Negative for fever.  Respiratory:  Negative for shortness of breath.   Cardiovascular:  Negative for chest pain.  Gastrointestinal:  Negative for constipation and diarrhea.  Genitourinary:  Negative for dysuria and hematuria.      Objective:     BP (!) 142/90   Pulse 64   Temp 97.8 F (36.6 C) (Temporal)   Ht 5\' 3"  (1.6 m)   Wt 168 lb (76.2 kg)   SpO2 96%   BMI 29.76 kg/m  BP Readings from Last 3 Encounters:  08/14/22 (!) 142/90  01/03/22 (!) 171/115  12/30/21 (!)  161/83   Wt Readings from Last 3 Encounters:  08/14/22 168 lb (76.2 kg)  01/03/22 174 lb (78.9 kg)  12/26/21 174 lb (78.9 kg)      Physical Exam Vitals reviewed.  Constitutional:      General: She is not in acute distress.    Appearance: Normal appearance.  HENT:     Head: Normocephalic and atraumatic.  Neck:     Thyroid: Thyromegaly present.     Vascular: No carotid bruit.  Cardiovascular:     Rate and Rhythm: Normal rate and regular rhythm.     Pulses: Normal pulses.     Heart sounds: Normal heart sounds.  Pulmonary:     Effort: Pulmonary effort is normal.     Breath sounds: Normal breath sounds.  Skin:    General: Skin is warm and dry.  Neurological:     General: No focal deficit present.     Mental Status: She is alert and oriented to person, place, and time.  Psychiatric:        Mood and Affect: Mood normal.        Behavior: Behavior normal.        Judgment: Judgment normal.      No results found for any visits on 08/14/22.    The ASCVD Risk score (Arnett DK, et al., 2019) failed to calculate for the following reasons:   The 2019 ASCVD risk score is only valid for  ages 77 to 20    Assessment & Plan:  Previously ordered labs were canceled and new labs ordered today based on patient's appointment. Problem List Items Addressed This Visit       Cardiovascular and Mediastinum   Essential hypertension    Chronic, above goal today.  Patient unwilling to have labs drawn to check for stable kidney function and electrolytes.  Patient educated that she will need labs drawn in order for me to refill her medications.  Patient reports she has enough for a few more weeks and will return to the office another day to have her labs drawn.  No symptoms of chest pain or shortness of breath, not willing to make dosage changes to medications as of right now.        Endocrine   Goiter    Thyroid does appear enlarged on exam still today, I recommended ultrasound of thyroid as  well as labs to follow-up incidental finding of suppressed TSH last time labs were drawn over a year ago patient has elected to defer labs and ultrasound.  Patient states she is willing to come in the next couple of weeks to have labs drawn.  Will need to discuss ultrasound again next time she is seen in the office.      Relevant Orders   Vitamin B12   Folate   Iron and TIBC   Ferritin   Reticulocytes   Pathologist smear review   CBC   TSH   T3, free   T4, free   Microalbumin / creatinine urine ratio   Comprehensive metabolic panel   Lipid panel   Hemoglobin A1c     Other   Anemia    Will pend labs for further evaluation for when she comes back to the office in a few weeks to have labs drawn.  Further recommendations may be made based upon these results.      Relevant Orders   Vitamin B12   Folate   Iron and TIBC   Ferritin   Reticulocytes   Pathologist smear review   CBC   TSH   T3, free   T4, free   Microalbumin / creatinine urine ratio   Comprehensive metabolic panel   Lipid panel   Hemoglobin A1c   Hyperlipidemia    Chronic, will add lipid panel to lab orders.  Further recommendations will be made based upon these results.      Relevant Orders   Vitamin B12   Folate   Iron and TIBC   Ferritin   Reticulocytes   Pathologist smear review   CBC   TSH   T3, free   T4, free   Microalbumin / creatinine urine ratio   Comprehensive metabolic panel   Lipid panel   Hemoglobin A1c   Prediabetes    A1c added to lab order for when she returns to the office in a couple of weeks for labs to be drawn.  Further recommendations may be made based upon these results.      Relevant Orders   Vitamin B12   Folate   Iron and TIBC   Ferritin   Reticulocytes   Pathologist smear review   CBC   TSH   T3, free   T4, free   Microalbumin / creatinine urine ratio   Comprehensive metabolic panel   Lipid panel   Hemoglobin A1c   Need for vaccination - Primary    Flu and  Prevnar 20 administered today, VIS provided.  Relevant Orders   Flu Vaccine QUAD High Dose(Fluad) (Completed)   Pneumococcal conjugate vaccine 20-valent (Prevnar 20) (Completed)   Vitamin B12   Folate   Iron and TIBC   Ferritin   Reticulocytes   Pathologist smear review   CBC   TSH   T3, free   T4, free   Microalbumin / creatinine urine ratio   Comprehensive metabolic panel   Lipid panel   Hemoglobin A1c    Return in about 3 months (around 11/13/2022) for F/u With Dariyah Garduno.    Ailene Ards, NP

## 2022-08-20 ENCOUNTER — Ambulatory Visit: Payer: Medicare Other | Admitting: Podiatry

## 2022-08-20 DIAGNOSIS — M722 Plantar fascial fibromatosis: Secondary | ICD-10-CM

## 2022-08-20 DIAGNOSIS — M79674 Pain in right toe(s): Secondary | ICD-10-CM | POA: Diagnosis not present

## 2022-08-20 DIAGNOSIS — B351 Tinea unguium: Secondary | ICD-10-CM | POA: Diagnosis not present

## 2022-08-20 DIAGNOSIS — M79675 Pain in left toe(s): Secondary | ICD-10-CM

## 2022-08-20 MED ORDER — BETAMETHASONE SOD PHOS & ACET 6 (3-3) MG/ML IJ SUSP
3.0000 mg | Freq: Once | INTRAMUSCULAR | Status: AC
  Administered 2022-08-20: 3 mg via INTRA_ARTICULAR

## 2022-08-20 NOTE — Progress Notes (Signed)
   Subjective: 87 y.o. female presenting today for evaluation of bilateral heel pain.  Patient is still having reservations about the surgery.  She has chronic bilateral plantar fasciitis.  Patient is currently having an acute flareup of left heel pain.  Patient also requesting nail debridement today.  She says her nails are long and painful and tender and she is unable to trim her own nails.  She presents for further treatment and evaluation   Past Medical History:  Diagnosis Date   Anxiety    Hypertension    Past Surgical History:  Procedure Laterality Date   ABDOMINAL HYSTERECTOMY     APPENDECTOMY     FOOT SURGERY     HEEL SPUR SURGERY     TONSILLECTOMY     No Known Allergies   Objective: Physical Exam General: The patient is alert and oriented x3 in no acute distress.  Dermatology: Skin is warm, dry and supple bilateral lower extremities. Negative for open lesions or macerations bilateral.  Hyperkeratotic elongated nails noted with curvature of the bilateral great toenails to the bilateral feet 1-5  Vascular: Dorsalis Pedis and Posterior Tibial pulses palpable bilateral.  Capillary fill time is immediate to all digits.  Neurological: Epicritic and protective threshold intact bilateral.   Musculoskeletal: There continues to be chronic tenderness to palpation to the plantar aspect of the left heel along the plantar fascia. All other joints range of motion within normal limits bilateral. Strength 5/5 in all groups bilateral.   MRI impression 08/16/2020 RT foot:  Moderate to moderately severe appearing plantar fasciitis of the medial cord. There is a small interstitial tear in the plantar fascia but no frank rupture. Mild reactive marrow edema in the calcaneus at the plantar fascia attachment noted.  MRI impression 08/16/2020 LT foot:  Mild plantar fasciitis. No plantar fascial tear.  Assessment: 1. Plantar fasciitis left 2.  History of plantar fasciotomy approximately 10+  years ago 3. Pain due to onychomycosis of toenails bilateral  Plan of Care:  1. Patient evaluated. 2.  Patient requested injections today.  Injection of 0.5 cc Celestone Soluspan injected in the bilateral plantar fascia 3.  Continue wearing good supportive shoes and sneakers and conservative care for her chronic plantar fasciitis since she does not want to pursue any surgery 4.  Mechanical debridement of nails 1-5 bilateral performed using a nail nipper without incident or bleeding 5. Return to clinic as needed  Edrick Kins, DPM [facility]  Dr. Edrick Kins, DPM    [address], [place] [address]                Office [phone]  Fax [phone]

## 2022-08-30 ENCOUNTER — Other Ambulatory Visit: Payer: Self-pay | Admitting: Nurse Practitioner

## 2022-08-30 DIAGNOSIS — I10 Essential (primary) hypertension: Secondary | ICD-10-CM

## 2022-09-01 ENCOUNTER — Other Ambulatory Visit: Payer: Self-pay | Admitting: Nurse Practitioner

## 2022-09-01 DIAGNOSIS — I10 Essential (primary) hypertension: Secondary | ICD-10-CM

## 2022-09-04 ENCOUNTER — Other Ambulatory Visit: Payer: Self-pay

## 2022-09-04 ENCOUNTER — Telehealth: Payer: Self-pay | Admitting: Nurse Practitioner

## 2022-09-04 DIAGNOSIS — I10 Essential (primary) hypertension: Secondary | ICD-10-CM

## 2022-09-04 MED ORDER — HYDRALAZINE HCL 25 MG PO TABS: ORAL_TABLET | ORAL | 2 refills | Status: DC

## 2022-09-04 NOTE — Telephone Encounter (Signed)
Caller & Relationship to patient: Self  Call back number: [phone]   Date of last office visit: 1.11.24  Date of next office visit: 4.11.24  Medication(s) to be refilled:  hydrALAZINE (APRESOLINE) 25 MG tablet   Preferred Pharmacy:   [facility] [phone]   Phone: [phone]  Fax: [phone]

## 2022-09-04 NOTE — Telephone Encounter (Signed)
Medication is send to pharmacy

## 2022-09-13 ENCOUNTER — Other Ambulatory Visit: Payer: Self-pay | Admitting: Nurse Practitioner

## 2022-09-13 DIAGNOSIS — N1832 Chronic kidney disease, stage 3b: Secondary | ICD-10-CM

## 2022-09-13 DIAGNOSIS — R7989 Other specified abnormal findings of blood chemistry: Secondary | ICD-10-CM

## 2022-09-13 DIAGNOSIS — I10 Essential (primary) hypertension: Secondary | ICD-10-CM

## 2022-09-13 DIAGNOSIS — D649 Anemia, unspecified: Secondary | ICD-10-CM

## 2022-09-15 ENCOUNTER — Ambulatory Visit: Payer: Medicare Other | Admitting: Podiatry

## 2022-09-15 VITALS — BP 191/102 | HR 92 | Temp 97.0°F

## 2022-09-15 DIAGNOSIS — M722 Plantar fascial fibromatosis: Secondary | ICD-10-CM

## 2022-09-15 MED ORDER — BETAMETHASONE SOD PHOS & ACET 6 (3-3) MG/ML IJ SUSP
3.0000 mg | Freq: Once | INTRAMUSCULAR | Status: AC
  Administered 2022-09-15: 3 mg via INTRA_ARTICULAR

## 2022-09-15 NOTE — Progress Notes (Signed)
   Chief Complaint  Patient presents with   Ingrown Toenail    Left foot ingrown toe, medial border. Its been a little sore and tender. No drainage noted.     Subjective: 87 y.o. female presenting today for evaluation of bilateral heel pain.  Patient is still having reservations about the surgery.  She has chronic bilateral plantar fasciitis.  Patient is currently having an acute flareup of left heel pain.   Past Medical History:  Diagnosis Date   Anxiety    Hypertension    Past Surgical History:  Procedure Laterality Date   ABDOMINAL HYSTERECTOMY     APPENDECTOMY     FOOT SURGERY     HEEL SPUR SURGERY     TONSILLECTOMY     No Known Allergies   Objective: Physical Exam General: The patient is alert and oriented x3 in no acute distress.  Dermatology: Skin is warm, dry and supple bilateral lower extremities. Negative for open lesions or macerations bilateral.  Hyperkeratotic elongated nails noted with curvature of the bilateral great toenails to the bilateral feet 1-5  Vascular: Dorsalis Pedis and Posterior Tibial pulses palpable bilateral.  Capillary fill time is immediate to all digits.  Neurological: Epicritic and protective threshold intact bilateral.   Musculoskeletal: There continues to be chronic tenderness to palpation to the plantar aspect of the left heel along the plantar fascia. All other joints range of motion within normal limits bilateral. Strength 5/5 in all groups bilateral.   MRI impression 08/16/2020 RT foot:  Moderate to moderately severe appearing plantar fasciitis of the medial cord. There is a small interstitial tear in the plantar fascia but no frank rupture. Mild reactive marrow edema in the calcaneus at the plantar fascia attachment noted.  MRI impression 08/16/2020 LT foot:  Mild plantar fasciitis. No plantar fascial tear.  Assessment: 1. Plantar fasciitis left 2.  History of plantar fasciotomy approximately 10+ years ago   Plan of Care:  1.  Patient evaluated. 2.  Patient requested injections today.  Injection of 0.5 cc Celestone Soluspan injected in the bilateral plantar fascia 3.  Continue wearing good supportive shoes and sneakers and conservative care for her chronic plantar fasciitis since she does not want to pursue any surgery 4.  Return to clinic as needed  Edrick Kins, DPM [facility]  Dr. Edrick Kins, DPM    [address], [place] [address]                Office [phone]  Fax [phone]

## 2022-09-17 ENCOUNTER — Ambulatory Visit (INDEPENDENT_AMBULATORY_CARE_PROVIDER_SITE_OTHER): Payer: Medicare Other

## 2022-09-17 ENCOUNTER — Ambulatory Visit (INDEPENDENT_AMBULATORY_CARE_PROVIDER_SITE_OTHER): Payer: Medicare Other | Admitting: Internal Medicine

## 2022-09-17 ENCOUNTER — Encounter: Payer: Self-pay | Admitting: Internal Medicine

## 2022-09-17 ENCOUNTER — Other Ambulatory Visit: Payer: Self-pay

## 2022-09-17 VITALS — BP 187/83 | HR 64 | Temp 97.6°F | Ht 62.0 in | Wt 163.0 lb

## 2022-09-17 DIAGNOSIS — N1832 Chronic kidney disease, stage 3b: Secondary | ICD-10-CM

## 2022-09-17 DIAGNOSIS — R7989 Other specified abnormal findings of blood chemistry: Secondary | ICD-10-CM | POA: Diagnosis not present

## 2022-09-17 DIAGNOSIS — E785 Hyperlipidemia, unspecified: Secondary | ICD-10-CM

## 2022-09-17 DIAGNOSIS — I129 Hypertensive chronic kidney disease with stage 1 through stage 4 chronic kidney disease, or unspecified chronic kidney disease: Secondary | ICD-10-CM | POA: Diagnosis not present

## 2022-09-17 DIAGNOSIS — E049 Nontoxic goiter, unspecified: Secondary | ICD-10-CM

## 2022-09-17 DIAGNOSIS — D649 Anemia, unspecified: Secondary | ICD-10-CM

## 2022-09-17 DIAGNOSIS — Z7189 Other specified counseling: Secondary | ICD-10-CM

## 2022-09-17 DIAGNOSIS — I10 Essential (primary) hypertension: Secondary | ICD-10-CM

## 2022-09-17 DIAGNOSIS — Z0000 Encounter for general adult medical examination without abnormal findings: Secondary | ICD-10-CM

## 2022-09-17 MED ORDER — HYDRALAZINE HCL 25 MG PO TABS: ORAL_TABLET | ORAL | 0 refills | Status: DC

## 2022-09-17 MED ORDER — AMLODIPINE-ATORVASTATIN 5-10 MG PO TABS: 1.0000 | ORAL_TABLET | Freq: Every day | ORAL | 11 refills | Status: DC

## 2022-09-17 MED ORDER — ATENOLOL 50 MG PO TABS: 50.0000 mg | ORAL_TABLET | Freq: Every day | ORAL | 3 refills | Status: DC

## 2022-09-17 NOTE — Progress Notes (Addendum)
Subjective:   [name] is a 87 y.o. female who presents for an Initial Medicare Annual Wellness Visit. I connected with  Megan Robbins on 09/19/22 by a  Face-To-Face  enabled telemedicine application and verified that I am speaking with the correct person using two identifiers.  Patient Location: Other:  Office/Clinic  Provider Location: Office/Clinic  I discussed the limitations of evaluation and management by telemedicine. The patient expressed understanding and agreed to proceed.  Review of Systems    Defer to PCP       Objective:    Today's Vitals   09/17/22 1122  BP: (!) 187/83  Pulse: 64  Temp: 97.6 F (36.4 C)  TempSrc: Oral  SpO2: 100%  Weight: 163 lb (73.9 kg)  Height: 5' 2"$  (1.575 m)  PainSc: 0-No pain   Body mass index is 29.81 kg/m.     09/17/2022   11:23 AM 01/03/2022    7:58 PM  Advanced Directives  Does Patient Have a Medical Advance Directive? No No  Would patient like information on creating a medical advance directive? No - Patient declined No - Patient declined    Current Medications (verified) Outpatient Encounter Medications as of 09/17/2022  Medication Sig   fexofenadine (ALLEGRA) 180 MG tablet Take 1 tablet (180 mg total) by mouth daily.   fluticasone (FLONASE) 50 MCG/ACT nasal spray Place 1 spray into both nostrils daily.   Lidocaine (HM LIDOCAINE PATCH) 4 % PTCH Apply 1 each topically daily. Remove after 12 hours each day.   [DISCONTINUED] ALPRAZolam (XANAX) 0.5 MG tablet Take by mouth.   [DISCONTINUED] amLODipine (NORVASC) 2.5 MG tablet Take 1 tablet (2.5 mg total) by mouth daily.   [DISCONTINUED] atenolol (TENORMIN) 50 MG tablet Take 1 tablet (50 mg total) by mouth daily. TAKE 1 TABLET(50 MG) BY MOUTH DAILY   [DISCONTINUED] gentamicin cream (GARAMYCIN) 0.1 % Apply 1 Application topically 2 (two) times daily.   [DISCONTINUED] hydrALAZINE (APRESOLINE) 25 MG tablet TAKE 1 TABLET(25 MG) BY MOUTH TWICE DAILY   [DISCONTINUED]  meclizine (ANTIVERT) 25 MG tablet Take 1 tablet (25 mg total) by mouth 3 (three) times daily as needed for dizziness.   [DISCONTINUED] methocarbamol (ROBAXIN) 500 MG tablet Take 1 tablet (500 mg total) by mouth 2 (two) times daily.   [DISCONTINUED] penicillin v potassium (VEETID) 500 MG tablet Take 500 mg by mouth 4 (four) times daily.   No facility-administered encounter medications on file as of 09/17/2022.    Allergies (verified) Patient has no known allergies.   History: Past Medical History:  Diagnosis Date   Acute urticaria 12/26/2021   Anxiety    Bronchitis due to COVID-19 virus 03/11/2021   Compression fracture of L3 vertebra ([place]) 05/13/2017   Dysuria 06/14/2021   Eustachian tube dysfunction, right 04/01/2018   Function kidney decreased 05/13/2017   Hypertension    Impacted cerumen of left ear 04/01/2018   Mid back pain 05/08/2017   Rash 06/23/2019   Past Surgical History:  Procedure Laterality Date   ABDOMINAL HYSTERECTOMY     APPENDECTOMY     FOOT SURGERY     HEEL SPUR SURGERY     TONSILLECTOMY     Family History  Problem Relation Age of Onset   Hyperlipidemia Mother    Hypertension Mother    Diabetes Daughter    Diabetes Son    Social History   Socioeconomic History   Marital status: Married    Spouse name: Not on file   Number of children: Not  on file   Years of education: Not on file   Highest education level: Not on file  Occupational History   Not on file  Tobacco Use   Smoking status: Never   Smokeless tobacco: Never  Vaping Use   Vaping Use: Never used  Substance and Sexual Activity   Alcohol use: No   Drug use: Never   Sexual activity: Not on file  Other Topics Concern   Not on file  Social History Narrative   No regular exercise   Social Determinants of Health   Financial Resource Strain: Low Risk  (09/17/2022)   Overall Financial Resource Strain (CARDIA)    Difficulty of Paying Living Expenses: Not very hard  Food Insecurity: No  Food Insecurity (09/17/2022)   Hunger Vital Sign    Worried About Running Out of Food in the Last Year: Never true    Ran Out of Food in the Last Year: Never true  Transportation Needs: No Transportation Needs (09/17/2022)   PRAPARE - Hydrologist (Medical): No    Lack of Transportation (Non-Medical): No  Physical Activity: Insufficiently Active (09/17/2022)   Exercise Vital Sign    Days of Exercise per Week: 4 days    Minutes of Exercise per Session: 30 min  Stress: No Stress Concern Present (09/17/2022)   [place]    Feeling of Stress : Not at all  Social Connections: Moderately Integrated (09/17/2022)   Social Connection and Isolation Panel [NHANES]    Frequency of Communication with Friends and Family: More than three times a week    Frequency of Social Gatherings with Friends and Family: More than three times a week    Attends Religious Services: More than 4 times per year    Active Member of [employer] or Organizations: No    Attends Music therapist: Never    Marital Status: Married    Tobacco Counseling Counseling given: Not Answered   Clinical Intake:  Pre-visit preparation completed: Yes  Pain : No/denies pain Pain Score: 0-No pain     Nutritional Risks: None Diabetes: No  How often do you need to have someone help you when you read instructions, pamphlets, or other written materials from your doctor or pharmacy?: 1 - Never What is the last grade level you completed in school?: 13 years  Diabetic?No  Interpreter Needed?: No  Information entered by :: Yatzary Merriweather,cma 09/17/22 11:23am   Activities of Daily Living    09/17/2022   11:24 AM 09/17/2022   11:16 AM  In your present state of health, do you have any difficulty performing the following activities:  Hearing? 0 0  Vision? 0 0  Difficulty concentrating or making decisions? 0 0  Walking or climbing  stairs? 0 0  Dressing or bathing? 0 0  Doing errands, shopping? 0 0    Patient Care Team: Patient, No Pcp Per as PCP - General (General Practice)  Indicate any recent Medical Services you may have received from other than [facility] providers in the past year (date may be approximate).     Assessment:   This is a routine wellness examination for Megan Robbins.  Hearing/Vision screen No results found.  Dietary issues and exercise activities discussed:     Goals Addressed   None   Depression Screen    09/17/2022   11:23 AM 09/17/2022   11:16 AM 08/14/2022    9:35 AM 12/26/2021    3:46 PM  05/21/2020    3:43 PM 09/20/2018    8:27 AM 05/08/2017    4:16 PM  PHQ 2/9 Scores  PHQ - 2 Score 0 0 0 0 0 0 0  PHQ- 9 Score 0 0 0        Fall Risk    09/17/2022   11:23 AM 09/17/2022   11:15 AM 08/14/2022    9:35 AM 06/14/2021    2:01 PM 05/21/2020    3:43 PM  Fall Risk   Falls in the past year? 0 0 0 0 0  Number falls in past yr: 0  0 0 0  Injury with Fall? 0  0 0 0  Risk for fall due to : No Fall Risks No Fall Risks No Fall Risks No Fall Risks   Follow up Falls evaluation completed;Falls prevention discussed Falls evaluation completed Falls evaluation completed Falls evaluation completed     FALL RISK PREVENTION PERTAINING TO THE HOME:  Any stairs in or around the home? No  If so, are there any without handrails? No  Home free of loose throw rugs in walkways, pet beds, electrical cords, etc? Yes  Adequate lighting in your home to reduce risk of falls? Yes   ASSISTIVE DEVICES UTILIZED TO PREVENT FALLS:  Life alert? No  Use of a cane, walker or w/c? No  Grab bars in the bathroom? No  Shower chair or bench in shower? No  Elevated toilet seat or a handicapped toilet? No   TIMED UP AND GO:  Was the test performed? Yes .  Length of time to ambulate 10 feet: 1 min.   Gait slow and steady with assistive device  Cognitive Function:        09/17/2022   11:24 AM  6CIT Screen  What  Year? 0 points  What month? 0 points  What time? 0 points  Count back from 20 0 points  Months in reverse 0 points  Repeat phrase 0 points  Total Score 0 points    Immunizations Immunization History  Administered Date(s) Administered   Fluad Quad(high Dose 65+) 05/21/2020, 05/10/2021, 08/14/2022   PFIZER(Purple Top)SARS-COV-2 Vaccination 10/02/2019, 11/01/2019   PNEUMOCOCCAL CONJUGATE-20 08/14/2022    TDAP status: Due, Education has been provided regarding the importance of this vaccine. Advised may receive this vaccine at local pharmacy or Health Dept. Aware to provide a copy of the vaccination record if obtained from local pharmacy or Health Dept. Verbalized acceptance and understanding.  Flu Vaccine status: Up to date  Pneumococcal vaccine status: Up to date  Covid-19 vaccine status: Information provided on how to obtain vaccines.   Qualifies for Shingles Vaccine? No   Zostavax completed No   Shingrix Completed?: No.    Education has been provided regarding the importance of this vaccine. Patient has been advised to call insurance company to determine out of pocket expense if they have not yet received this vaccine. Advised may also receive vaccine at local pharmacy or Health Dept. Verbalized acceptance and understanding.  Screening Tests Health Maintenance  Topic Date Due   DTaP/Tdap/Td (1 - Tdap) Never done   Zoster Vaccines- Shingrix (1 of 2) Never done   DEXA SCAN  Never done   Medicare Annual Wellness (AWV)  09/18/2023   Pneumonia Vaccine 78+ Years old  Completed   INFLUENZA VACCINE  Completed   HPV VACCINES  Aged Out   COVID-19 Vaccine  Discontinued    Health Maintenance  Health Maintenance Due  Topic Date Due   DTaP/Tdap/Td (  1 - Tdap) Never done   Zoster Vaccines- Shingrix (1 of 2) Never done   DEXA SCAN  Never done        Lung Cancer Screening: (Low Dose CT Chest recommended if Age 42-80 years, 30 pack-year currently smoking OR have quit w/in  15years.) does not qualify.   Lung Cancer Screening Referral: N/A  Additional Screening:  Hepatitis C Screening: does not qualify; Completed N/A  Vision Screening: Recommended annual ophthalmology exams for early detection of glaucoma and other disorders of the eye. Is the patient up to date with their annual eye exam?  Yes  Who is the provider or what is the name of the office in which the patient attends annual eye exams? Dr.Groat If pt is not established with a provider, would they like to be referred to a provider to establish care? No .   Dental Screening: Recommended annual dental exams for proper oral hygiene  Community Resource Referral / Chronic Care Management: CRR required this visit?  No   CCM required this visit?  No      Plan:     I have personally reviewed and noted the following in the patient's chart:   Medical and social history Use of alcohol, tobacco or illicit drugs  Current medications and supplements including opioid prescriptions. Patient is currently taking opioid prescriptions. Information provided to patient regarding non-opioid alternatives. Patient advised to discuss non-opioid treatment plan with their provider. Functional ability and status Nutritional status Physical activity Advanced directives List of other physicians Hospitalizations, surgeries, and ER visits in previous 12 months Vitals Screenings to include cognitive, depression, and falls Referrals and appointments  In addition, I have reviewed and discussed with patient certain preventive protocols, quality metrics, and best practice recommendations. A written personalized care plan for preventive services as well as general preventive health recommendations were provided to patient.     Kerin Perna, [facility]   09/19/2022   Nurse Notes: Face-To-Face Visit  Ms. Buena Irish , Thank you for taking time to come for your Medicare Wellness Visit. I appreciate your ongoing commitment to your  health goals. Please review the following plan we discussed and let me know if I can assist you in the future.   These are the goals we discussed:  Goals   None     This is a list of the screening recommended for you and due dates:  Health Maintenance  Topic Date Due   DTaP/Tdap/Td vaccine (1 - Tdap) Never done   Zoster (Shingles) Vaccine (1 of 2) Never done   DEXA scan (bone density measurement)  Never done   Medicare Annual Wellness Visit  09/18/2023   Pneumonia Vaccine  Completed   Flu Shot  Completed   HPV Vaccine  Aged Out   COVID-19 Vaccine  Discontinued

## 2022-09-17 NOTE — Patient Instructions (Signed)
[name], it was a pleasure seeing you today! You endorsed feeling well today. Below are some of the things we talked about this visit. We look forward to seeing you in the follow up appointment!  Today we discussed: Your blood pressure was elevated but you stated it is better at home. Highest at home is 140s/80-90. We will increase your amlodipine to 5 mg in a new medication caudet, This has cholesterol medication in it as well.  Please take all of your medications.  You have refusing lab work and we will get this done when you are ready to have it done. You understand the importance of it but would like to wait.   I have ordered the following labs today:  Lab Orders  No laboratory test(s) ordered today      Referrals ordered today:   Referral Orders  No referral(s) requested today     I have ordered the following medication/changed the following medications:   Stop the following medications: Medications Discontinued During This Encounter  Medication Reason   ALPRAZolam (XANAX) 0.5 MG tablet Patient has not taken in last 30 days   gentamicin cream (GARAMYCIN) 0.1 % Patient has not taken in last 30 days   penicillin v potassium (VEETID) 500 MG tablet Patient has not taken in last 30 days   meclizine (ANTIVERT) 25 MG tablet Patient has not taken in last 30 days   methocarbamol (ROBAXIN) 500 MG tablet Patient has not taken in last 30 days   amLODipine (NORVASC) 2.5 MG tablet Change in therapy     Start the following medications: Meds ordered this encounter  Medications   amLODipine-atorvastatin (CADUET) 5-10 MG tablet    Sig: Take 1 tablet by mouth daily.    Dispense:  30 tablet    Refill:  11     Follow-up: 6 month follow up  Please make sure to arrive 15 minutes prior to your next appointment. If you arrive late, you may be asked to reschedule.   We look forward to seeing you next time. Please call our clinic at [phone] if you have any questions or  concerns. The best time to call is Monday-Friday from 9am-4pm, but there is someone available 24/7. If after hours or the weekend, call the main hospital number and ask for the Internal Medicine Resident On-Call. If you need medication refills, please notify your pharmacy one week in advance and they will send [place] a request.  Thank you for letting us take part in your care. Wishing you the best!  Thank you, Idamae Schuller, MD

## 2022-09-17 NOTE — Progress Notes (Unsigned)
  CC: Establishment of care  HPI:  [name] is a 87 y.o. female with a past medical history of HTN, HLD, plantar fasciitis, allergic rhinitis and presenting to the clinic today for establishment of care. Please see problem based assessment and plan for additional details.  Past Medical History:  Diagnosis Date   Acute urticaria 12/26/2021   Anxiety    Bronchitis due to COVID-19 virus 03/11/2021   Compression fracture of L3 vertebra ([place]) 05/13/2017   Eustachian tube dysfunction, right 04/01/2018   Hypertension    Impacted cerumen of left ear 04/01/2018   Rash 06/23/2019      Current Outpatient Medications (Cardiovascular):    amLODipine (NORVASC) 2.5 MG tablet, Take 1 tablet (2.5 mg total) by mouth daily.   atenolol (TENORMIN) 50 MG tablet, Take 1 tablet (50 mg total) by mouth daily. TAKE 1 TABLET(50 MG) BY MOUTH DAILY   hydrALAZINE (APRESOLINE) 25 MG tablet, TAKE 1 TABLET(25 MG) BY MOUTH TWICE DAILY  Current Outpatient Medications (Respiratory):    fexofenadine (ALLEGRA) 180 MG tablet, Take 1 tablet (180 mg total) by mouth daily.   fluticasone (FLONASE) 50 MCG/ACT nasal spray, Place 1 spray into both nostrils daily.    Current Outpatient Medications (Other):    Lidocaine (HM LIDOCAINE PATCH) 4 % PTCH, Apply 1 each topically daily. Remove after 12 hours each day.  Family History  Problem Relation Age of Onset   Hyperlipidemia Mother    Hypertension Mother    Diabetes Daughter    Diabetes Son    Alzheimer's-sister HTN-Mother DMII-no diabetes Cancer: no hx of cancer  Social History: Lives with husband. No pets, has 3 children 2 girls and one boy. No smoking, drinking, no illicit substances.    Review of Systems: ROS negative except for what is noted on the assessment and plan.  Vitals:   09/17/22 1119  BP: (!) 187/83  Pulse: 64  Temp: 97.6 F (36.4 C)  TempSrc: Oral  SpO2: 100%  Weight: 163 lb (73.9 kg)  Height: 5\' 2"  (1.575 m)     Physical  Exam: General: Well appearing, NAD HENT: normocephalic, atraumatic EYES: conjunctiva non-erythematous, no scleral icterus CV: regular rate, normal rhythm, no murmurs, rubs, gallops. Pulmonary: normal work of breathing on RA, lungs clear to auscultation, no rales, wheezes, rhonchi Abdominal: non-distended, soft, non-tender to palpation, normal BS Skin: Warm and dry, no rashes or lesions Neurological: MS: awake, alert and oriented x3, normal speech and fund of knowledge Motor: moves all extremities antigravity Psych: normal affect    Assessment & Plan:   No problem-specific Assessment & Plan notes found for this encounter.   See Encounters Tab for problem based charting.  Patient discussed with Dr. {NAMES:[id number]::"Guilloud","Hoffman","Mullen","Narendra","Vincent","Machen","Lau","Hatcher"} Idamae Schuller, MD Tillie Rung. [facility] Internal Medicine Residency, PGY-2   HTN On amlodipine 2.5 mg qd, hydralazine 25 mg BID, atenolol 50 mg qd.  Reports good pressuer  HLD No on statin. LDL 181 in 05/2021  Prediabetes A1c 6.3

## 2022-09-18 ENCOUNTER — Other Ambulatory Visit: Payer: Self-pay | Admitting: Nurse Practitioner

## 2022-09-18 DIAGNOSIS — N1832 Chronic kidney disease, stage 3b: Secondary | ICD-10-CM

## 2022-09-18 DIAGNOSIS — D649 Anemia, unspecified: Secondary | ICD-10-CM

## 2022-09-18 DIAGNOSIS — I10 Essential (primary) hypertension: Secondary | ICD-10-CM

## 2022-09-18 DIAGNOSIS — R7989 Other specified abnormal findings of blood chemistry: Secondary | ICD-10-CM

## 2022-09-20 ENCOUNTER — Encounter: Payer: Self-pay | Admitting: Internal Medicine

## 2022-09-20 DIAGNOSIS — Z7189 Other specified counseling: Secondary | ICD-10-CM | POA: Insufficient documentation

## 2022-09-20 NOTE — Assessment & Plan Note (Addendum)
Pt was referred to nephrology by her previous provider for declining renal function but states she does not want to see other specialist. She also does not want frequent lab work to monitor her renal function. Will try to optimize BP control with increase in her amlodipine.

## 2022-09-20 NOTE — Assessment & Plan Note (Signed)
Pt's LDL was 181 in 2022. Given her hypertension which could be uncontrolled given today's reading but pt reporting better readings at home, the patient at higher risk for acute CV event. ASCVD risk nto applicable here given pt is older than 71. Will add low dose statin. Advised pt to monitor for side effects.

## 2022-09-20 NOTE — Assessment & Plan Note (Addendum)
Pt presents for establishment of care. She states she is independent in all ADLs and iADLs. She does not want frequent monitoring of her lab work and referrals placed to sub-specialist. She wants to continue current medications. She states she is enjoying her life and wants to maximize the quality of her life. At next visit will discuss ACP.

## 2022-09-20 NOTE — Assessment & Plan Note (Addendum)
Pt's BP elevated today 187/83. She reports she has white coat hypertension and her blood pressures are better at home with around 140s/80s. She states it can get elevated into 150s at times. She has CKDIIIb. She is on amlodipine 2.5 mg, atenolol 50 mg qd, and hydralazine 25 mg BID. We will increase her amlodipine to 5 mg from 2.5 mg for better BP control. Pt states she does not like lab work to be done frequently and is deferring lab work today. She is denying any complaints of chest pain, shortness of breath, or headaches. Will give her BP log to monitor blood pressures at home and bring at next follow up.

## 2022-09-20 NOTE — Assessment & Plan Note (Signed)
Patient's previous provider recommended performing ultrasound of enlarged thyroid. Pt states this has been a chronic problem for her and does not want further work up done.

## 2022-09-20 NOTE — Assessment & Plan Note (Signed)
Pt's TSH level was low in 2022. She is not endorsing any symptoms. Advised repeating lab work today but pt states she will get them after 6 months and does not like frequent lab work. She has hx of goiter, and an ultrasound was ordered but pt would not like to proceed with further work up of this.

## 2022-09-22 ENCOUNTER — Ambulatory Visit: Payer: Medicare Other | Admitting: Podiatry

## 2022-09-22 DIAGNOSIS — M79675 Pain in left toe(s): Secondary | ICD-10-CM | POA: Diagnosis not present

## 2022-09-22 DIAGNOSIS — B351 Tinea unguium: Secondary | ICD-10-CM | POA: Diagnosis not present

## 2022-09-22 DIAGNOSIS — M722 Plantar fascial fibromatosis: Secondary | ICD-10-CM

## 2022-09-22 MED ORDER — BETAMETHASONE SOD PHOS & ACET 6 (3-3) MG/ML IJ SUSP
3.0000 mg | Freq: Once | INTRAMUSCULAR | Status: AC
  Administered 2022-09-22: 3 mg via INTRA_ARTICULAR

## 2022-09-22 NOTE — Progress Notes (Signed)
Internal Medicine Clinic Attending  Case and documentation reviewed.  I reviewed the AWV findings.  I agree with the assessment, diagnosis, and plan of care documented in the AWV note.     

## 2022-09-22 NOTE — Progress Notes (Signed)
Internal Medicine Clinic Attending  Case discussed with Dr. Khan  at the time of the visit.  We reviewed the resident's history and exam and pertinent patient test results.  I agree with the assessment, diagnosis, and plan of care documented in the resident's note.  

## 2022-09-22 NOTE — Addendum Note (Signed)
Addended by: Gilles Chiquito B on: 09/22/2022 10:12 AM   Modules accepted: Level of Service

## 2022-09-22 NOTE — Progress Notes (Signed)
Chief Complaint  Patient presents with   Ingrown Toenail    Left foot hallux ingrown toenail, right heel pain     Subjective: 87 y.o. female presenting today for evaluation of bilateral heel pain.  Patient states that the injection did help her left heel.  She is now having right heel pain.  She also presents because she is experiencing pain and tenderness associated to the left great toenail plate.  Past Medical History:  Diagnosis Date   Acute urticaria 12/26/2021   Anxiety    Bronchitis due to COVID-19 virus 03/11/2021   Compression fracture of L3 vertebra ([place]) 05/13/2017   Dysuria 06/14/2021   Eustachian tube dysfunction, right 04/01/2018   Function kidney decreased 05/13/2017   Hypertension    Impacted cerumen of left ear 04/01/2018   Low back strain, initial encounter 02/22/2021   Mid back pain 05/08/2017   Rash 06/23/2019   Seasonal allergic rhinitis 04/01/2018   Stress and adjustment reaction 05/08/2017   Past Surgical History:  Procedure Laterality Date   ABDOMINAL HYSTERECTOMY     APPENDECTOMY     FOOT SURGERY     HEEL SPUR SURGERY     TONSILLECTOMY     No Known Allergies   Objective: Physical Exam General: The patient is alert and oriented x3 in no acute distress.  Dermatology: Skin is warm, dry and supple bilateral lower extremities. Negative for open lesions or macerations bilateral.  There is some nail dystrophy noted along the medial border of the left hallux nail plate with associated tenderness with palpation  Vascular: Dorsalis Pedis and Posterior Tibial pulses palpable bilateral.  Capillary fill time is immediate to all digits.  Neurological: Epicritic and protective threshold intact bilateral.   Musculoskeletal: There continues to be chronic tenderness to palpation to the plantar aspect of the right heel along the plantar fascia. All other joints range of motion within normal limits bilateral. Strength 5/5 in all groups bilateral.   MRI  impression 08/16/2020 RT foot:  Moderate to moderately severe appearing plantar fasciitis of the medial cord. There is a small interstitial tear in the plantar fascia but no frank rupture. Mild reactive marrow edema in the calcaneus at the plantar fascia attachment noted.  MRI impression 08/16/2020 LT foot:  Mild plantar fasciitis. No plantar fascial tear.  Assessment: 1. Plantar fasciitis right 2.  History of plantar fasciotomy approximately 10+ years ago 3 pain due to onychomycosis of toenail left hallux   Plan of Care:  1. Patient evaluated.  Mechanical debridement of the offending border of the nail plate left medial hallux was performed today using a nail nipper without incident or bleeding.  Patient did feel significant relief after debridement 2.  Patient requested injections today now for the right plantar fascia.  Patient does have a chronic history of bilateral plantar fasciitis.  Last visit on 09/15/2022 administration of Celestone Soluspan was injected along the left plantar fascia but not the right.  Injection of 0.5 cc Celestone Soluspan injected right plantar fascia  3.  Continue wearing good supportive shoes and sneakers and conservative care for her chronic plantar fasciitis since she does not want to pursue any surgery 4.  Return to clinic as needed  Edrick Kins, DPM [facility]  Dr. Edrick Kins, DPM    [address]  [place], [place] [address]                Office [phone]  Fax [phone]

## 2022-10-07 ENCOUNTER — Other Ambulatory Visit: Payer: Self-pay | Admitting: Internal Medicine

## 2022-10-07 ENCOUNTER — Telehealth: Payer: Self-pay

## 2022-10-07 NOTE — Telephone Encounter (Signed)
Pt is requesting a call back .Marland Kitchen She is wanting to know if there is anything she can take otc for a cough ... Pt was seen in our office as a NEW EST CARE  2/14

## 2022-10-09 ENCOUNTER — Ambulatory Visit (INDEPENDENT_AMBULATORY_CARE_PROVIDER_SITE_OTHER): Payer: Medicare Other | Admitting: Internal Medicine

## 2022-10-09 DIAGNOSIS — J329 Chronic sinusitis, unspecified: Secondary | ICD-10-CM | POA: Insufficient documentation

## 2022-10-09 DIAGNOSIS — J309 Allergic rhinitis, unspecified: Secondary | ICD-10-CM

## 2022-10-09 DIAGNOSIS — J019 Acute sinusitis, unspecified: Secondary | ICD-10-CM | POA: Diagnosis not present

## 2022-10-09 DIAGNOSIS — I10 Essential (primary) hypertension: Secondary | ICD-10-CM

## 2022-10-09 NOTE — Progress Notes (Signed)
  [facility] Internal Medicine Residency Telephone Encounter Continuity Care Appointment  HPI:  This telephone encounter was created for [name] on 10/09/2022 for the following purpose/cc for sinusitis.   Past Medical History:  Past Medical History:  Diagnosis Date   Acute urticaria 12/26/2021   Anxiety    Bronchitis due to COVID-19 virus 03/11/2021   Compression fracture of L3 vertebra ([place]) 05/13/2017   Dysuria 06/14/2021   Eustachian tube dysfunction, right 04/01/2018   Function kidney decreased 05/13/2017   Hypertension    Impacted cerumen of left ear 04/01/2018   Low back strain, initial encounter 02/22/2021   Mid back pain 05/08/2017   Rash 06/23/2019   Seasonal allergic rhinitis 04/01/2018   Stress and adjustment reaction 05/08/2017     ROS:  Negative unless stated in the HPI.    Assessment / Plan / Recommendations:  Please see A&P under problem oriented charting for assessment of the patient's acute and chronic medical conditions.  As always, pt is advised that if symptoms worsen or new symptoms arise, they should go to an urgent care facility or to to ER for further evaluation.   Consent and Medical Decision Making:  Patient discussed with Dr. Evette Doffing This is a telephone encounter between Megan Robbins and Megan Robbins on 10/09/2022 for sinusitis. The visit was conducted with the patient located at home and Megan Robbins at [facility]. The patient's identity was confirmed using their DOB and current address. The patient has consented to being evaluated through a telephone encounter and understands the associated risks (an examination cannot be done and the patient may need to come in for an appointment) / benefits (allows the patient to remain at home, decreasing exposure to coronavirus). I personally spent 10 minutes on medical discussion.     Sinusitis Pt with complaint of symptoms of acute sinusitis since 10/05/21. States she is having nasal and sinus congestion  along with post-nasal drip. She is taking her allerga and started using flonase nasal spray. She is having cough with yellow mucus production that is improving. No muscle aches, sore throat, fevers, chills, or shortness of breath. Ddx include allergic vs viral sinusitis. Symptoms are mild and advised continuing current treatment with the following additions: increasing nasal spray to BID and starting nasal rinses. Provided over the phone instructions on proper use of this and repeatedly stated not to use tap water. Advised her to get this from the pharmacy and ask the pharmacist for appropriate instructions to use. Return precautions given.   Essential hypertension Pt reports improved readings at 140s/80s at home. Will continue current regimen. At next outpatient visit, will get routine labs.

## 2022-10-09 NOTE — Assessment & Plan Note (Addendum)
Pt with complaint of symptoms of acute sinusitis since 10/05/21. States she is having nasal and sinus congestion along with post-nasal drip. She is taking her allerga and started using flonase nasal spray. She is having cough with yellow mucus production that is improving. No muscle aches, sore throat, fevers, chills, or shortness of breath. Ddx include allergic vs viral sinusitis. Symptoms are mild and advised continuing current treatment with the following additions: increasing nasal spray to BID and starting nasal rinses. Provided over the phone instructions on proper use of this and repeatedly stated not to use tap water. Advised her to get this from the pharmacy and ask the pharmacist for appropriate instructions to use. Return precautions given.

## 2022-10-09 NOTE — Assessment & Plan Note (Signed)
Pt reports improved readings at 140s/80s at home. Will continue current regimen. At next outpatient visit, will get routine labs.

## 2022-10-13 NOTE — Addendum Note (Signed)
Addended by: Lalla Brothers T on: 10/13/2022 08:22 AM   Modules accepted: Level of Service

## 2022-10-13 NOTE — Progress Notes (Signed)
Internal Medicine Clinic Attending  Case discussed with Dr. Khan  At the time of the visit.  We reviewed the resident's history and exam and pertinent patient test results.  I agree with the assessment, diagnosis, and plan of care documented in the resident's note.  

## 2022-10-14 ENCOUNTER — Telehealth: Payer: Self-pay | Admitting: *Deleted

## 2022-10-14 NOTE — Telephone Encounter (Signed)
Call from patient who spoke with Dr. Humphrey Rolls last week about sneezing and nasal congestion.  Tried the Allergy medication that was suggested. And no improvement.    Stated Dr. Humphrey Rolls mentioned doing a swab if no improvement.  Patient given appointment for 10/15/2022 at 2:45 PM for assessment of the symptoms.

## 2022-10-15 ENCOUNTER — Encounter: Payer: Self-pay | Admitting: Student

## 2022-10-15 ENCOUNTER — Ambulatory Visit (INDEPENDENT_AMBULATORY_CARE_PROVIDER_SITE_OTHER): Payer: Medicare Other | Admitting: Student

## 2022-10-15 ENCOUNTER — Other Ambulatory Visit: Payer: Self-pay

## 2022-10-15 VITALS — BP 137/79 | HR 67 | Temp 98.2°F | Resp 28 | Ht 62.0 in | Wt 165.6 lb

## 2022-10-15 DIAGNOSIS — J019 Acute sinusitis, unspecified: Secondary | ICD-10-CM

## 2022-10-15 DIAGNOSIS — I129 Hypertensive chronic kidney disease with stage 1 through stage 4 chronic kidney disease, or unspecified chronic kidney disease: Secondary | ICD-10-CM | POA: Diagnosis not present

## 2022-10-15 DIAGNOSIS — J309 Allergic rhinitis, unspecified: Secondary | ICD-10-CM | POA: Diagnosis not present

## 2022-10-15 DIAGNOSIS — N1832 Chronic kidney disease, stage 3b: Secondary | ICD-10-CM

## 2022-10-15 DIAGNOSIS — I10 Essential (primary) hypertension: Secondary | ICD-10-CM

## 2022-10-15 MED ORDER — SALINE SPRAY 0.65 % NA SOLN: 1.0000 | NASAL | 0 refills | Status: AC | PRN

## 2022-10-15 NOTE — Assessment & Plan Note (Signed)
She would not like to obtain repeat lab work at this time to follow up on kidney function. She feels fine and will consider if she wants lab work at next visit. Not on any nephrotoxic medications at this time which is reassuring.

## 2022-10-15 NOTE — Assessment & Plan Note (Signed)
She presented today for follow up of sinusitis. She is feeling a little better symptomatically but still notes intermittent congestion and pressure changes in ears. She has been using flonase and allegra daily with relatively good success. She denies any systemic symptoms. Her daughter wanted her to come in to see if she needs antibiotics.  Her ear exam is normal bilaterally today. She does have some mild redness in bilateral nares but otherwise normal exam.   She does not appear to have an active infection. She is feeling better although her symptoms have not completely resolved. Will continue flonase and allegra. Also advised her to use saline nasal sprays and a Neti pot to further help with symptom management.  Will have her follow up in 3 months.

## 2022-10-15 NOTE — Patient Instructions (Signed)
Megan Robbins,  It was a pleasure seeing you in the clinic today.   As we talked about, continue using your flonase and allegra as you have been. I have prescribed a saline spray for you to use as well which will help with your symptoms. Please try using a Neti Pot to flush your nose. This will help with your symptoms too and make it feel easier to breathe. You do not need any antibiotics for this. Please come back in 3 months for your next visit.  Please call our clinic at [phone] if you have any questions or concerns. The best time to call is Monday-Friday from 9am-4pm, but there is someone available 24/7 at the same number. If you need medication refills, please notify your pharmacy one week in advance and they will send [place] a request.   Thank you for letting us take part in your care. We look forward to seeing you next time!

## 2022-10-15 NOTE — Progress Notes (Signed)
   CC: f/u allergic rhinitis, sinusitis  HPI:  [name] is a 87 y.o. female with history listed below presenting to the [facility] for f/u allergic rhinitis, sinusitis. Please see individualized problem based charting for full HPI.  Past Medical History:  Diagnosis Date   Acute urticaria 12/26/2021   Anxiety    Bronchitis due to COVID-19 virus 03/11/2021   Compression fracture of L3 vertebra ([place]) 05/13/2017   Dysuria 06/14/2021   Eustachian tube dysfunction, right 04/01/2018   Function kidney decreased 05/13/2017   Hypertension    Impacted cerumen of left ear 04/01/2018   Low back strain, initial encounter 02/22/2021   Mid back pain 05/08/2017   Rash 06/23/2019   Seasonal allergic rhinitis 04/01/2018   Stress and adjustment reaction 05/08/2017    Review of Systems:  Negative aside from that listed in individualized problem based charting.  Physical Exam:  Vitals:   10/15/22 1429  BP: 137/79  Pulse: 67  Resp: (!) 28  Temp: 98.2 F (36.8 C)  TempSrc: Oral  SpO2: 100%  Weight: 165 lb 9.6 oz (75.1 kg)  Height: 5\' 2"  (1.575 m)   Physical Exam Constitutional:      Appearance: Normal appearance. She is obese. She is not ill-appearing.  HENT:     Right Ear: Tympanic membrane, ear canal and external ear normal.     Left Ear: Tympanic membrane, ear canal and external ear normal.     Nose: No congestion or rhinorrhea.     Comments: Mild redness in bilateral nares, no purulent discharge noted. Cardiovascular:     Rate and Rhythm: Normal rate and regular rhythm.     Pulses: Normal pulses.     Heart sounds: Normal heart sounds. No murmur heard.    No gallop.  Pulmonary:     Effort: Pulmonary effort is normal.     Breath sounds: Normal breath sounds. No wheezing, rhonchi or rales.  Abdominal:     General: Bowel sounds are normal.     Palpations: Abdomen is soft.     Tenderness: There is no abdominal tenderness. There is no guarding or rebound.  Musculoskeletal:         General: No swelling. Normal range of motion.  Skin:    General: Skin is warm and dry.  Neurological:     General: No focal deficit present.     Mental Status: She is alert and oriented to person, place, and time.  Psychiatric:        Mood and Affect: Mood normal.        Behavior: Behavior normal.      Assessment & Plan:   See Encounters Tab for problem based charting.  Patient discussed with Dr.  Saverio Danker

## 2022-10-15 NOTE — Assessment & Plan Note (Signed)
She continues to take allegra daily and flonase as well. Given sinusitis (still having some mild symptoms) and allergic rhinitis, have also recommended saline nasal spray and Neti pot use to help with management. Have advised her to be careful now that Spring time is here to avoid further exacerbations of her symptoms. She understands.

## 2022-10-15 NOTE — Assessment & Plan Note (Signed)
BP relatively well controlled at 137/79 today. She takes norvasc '5mg'$  daily, atenolol '50mg'$  daily, and hydralazine '25mg'$  BID. Continue current regimen.

## 2022-10-17 NOTE — Progress Notes (Signed)
Internal Medicine Clinic Attending  Case discussed with Dr. Jinwala  At the time of the visit.  We reviewed the resident's history and exam and pertinent patient test results.  I agree with the assessment, diagnosis, and plan of care documented in the resident's note.  

## 2022-11-10 ENCOUNTER — Telehealth: Payer: Self-pay

## 2022-11-10 NOTE — Telephone Encounter (Signed)
Patient called regarding side effects for medication (Hydralazine) she stated she is losing weight and having frequent urination after taking the medication. Please return patient call.

## 2022-11-10 NOTE — Telephone Encounter (Signed)
Return pt's call who stated she has been taking Hydralazine 25 mg BID for about a year. Stated she started having increased urination after she started taking this med but she didn't think anything of it. Then she started losing weight; she looked up the SE's, and one was loss of appetite. Stated she has not loss her appetite but it's not as before. Offered her an appt for this afternoon but she stated she was not leaving the house during the eclipse.

## 2022-11-13 ENCOUNTER — Ambulatory Visit: Payer: Medicare Other | Admitting: Nurse Practitioner

## 2022-12-10 ENCOUNTER — Ambulatory Visit: Payer: Medicare Other

## 2022-12-10 ENCOUNTER — Ambulatory Visit: Payer: Medicare Other | Admitting: Podiatry

## 2022-12-10 DIAGNOSIS — M79671 Pain in right foot: Secondary | ICD-10-CM | POA: Diagnosis not present

## 2022-12-10 DIAGNOSIS — M79675 Pain in left toe(s): Secondary | ICD-10-CM | POA: Diagnosis not present

## 2022-12-10 DIAGNOSIS — M79674 Pain in right toe(s): Secondary | ICD-10-CM

## 2022-12-10 DIAGNOSIS — M722 Plantar fascial fibromatosis: Secondary | ICD-10-CM

## 2022-12-10 DIAGNOSIS — M79672 Pain in left foot: Secondary | ICD-10-CM

## 2022-12-10 DIAGNOSIS — B351 Tinea unguium: Secondary | ICD-10-CM | POA: Diagnosis not present

## 2022-12-10 MED ORDER — BETAMETHASONE SOD PHOS & ACET 6 (3-3) MG/ML IJ SUSP
3.0000 mg | Freq: Once | INTRAMUSCULAR | Status: AC
Start: 2022-12-10 — End: 2022-12-10
  Administered 2022-12-10: 3 mg via INTRA_ARTICULAR

## 2022-12-10 NOTE — Progress Notes (Signed)
Chief Complaint  Patient presents with   Foot Pain    Pain located in bilateral heels, problem has been ongoing for years, prior treatment has been cortisone injections, nail trim    Subjective: 87 y.o. female presenting today for evaluation of bilateral heel pain.  Chronic history of plantar fasciitis bilateral.  Requesting injections today.  We have discussed surgery multiple times in the past but she continues to only want conservative care. Patient also requesting nail debridement today.  She says her nails are tender and elongated and painful in shoes.  Past Medical History:  Diagnosis Date   Acute urticaria 12/26/2021   Anxiety    Bronchitis due to COVID-19 virus 03/11/2021   Compression fracture of L3 vertebra ([facility]) 05/13/2017   Dysuria 06/14/2021   Eustachian tube dysfunction, right 04/01/2018   Function kidney decreased 05/13/2017   Hypertension    Impacted cerumen of left ear 04/01/2018   Low back strain, initial encounter 02/22/2021   Mid back pain 05/08/2017   Rash 06/23/2019   Seasonal allergic rhinitis 04/01/2018   Stress and adjustment reaction 05/08/2017   Past Surgical History:  Procedure Laterality Date   ABDOMINAL HYSTERECTOMY     APPENDECTOMY     FOOT SURGERY     HEEL SPUR SURGERY     TONSILLECTOMY     No Known Allergies   Objective: Physical Exam General: The patient is alert and oriented x3 in no acute distress.  Dermatology: Skin is warm, dry and supple bilateral lower extremities. Negative for open lesions or macerations bilateral.  There is some nail dystrophy noted along the medial border of the left hallux nail plate with associated tenderness with palpation  Vascular: Dorsalis Pedis and Posterior Tibial pulses palpable bilateral.  Capillary fill time is immediate to all digits.  Neurological: Grossly active via light touch  Musculoskeletal: There continues to be chronic tenderness to palpation to the plantar aspect of the bilateral heels  along the plantar fascia. All other joints range of motion within normal limits bilateral. Strength 5/5 in all groups bilateral.   MRI impression 08/16/2020 RT foot:  Moderate to moderately severe appearing plantar fasciitis of the medial cord. There is a small interstitial tear in the plantar fascia but no frank rupture. Mild reactive marrow edema in the calcaneus at the plantar fascia attachment noted.  MRI impression 08/16/2020 LT foot:  Mild plantar fasciitis. No plantar fascial tear.  Assessment: 1. Plantar fasciitis bilateral 2.  History of plantar fasciotomy approximately 10+ years ago 3 pain due to onychomycosis of toenails both   Plan of Care:  1. Patient evaluated.  Mechanical debridement of nails 1-5 bilateral was performed using a nail nipper without incident or bleeding 2.  Patient requested injections today now for the right plantar fascia.  Patient does have a chronic history of bilateral plantar fasciitis.  Injection of 0.5 cc Celestone Soluspan injected into the bilateral plantar fascia 3.  Continue wearing good supportive shoes and sneakers and conservative care for her chronic plantar fasciitis since she does not want to pursue any surgery 4.  Return to clinic as needed  Felecia Shelling, DPM [facility]  Dr. Felecia Shelling, DPM    [address], [place] [address]  Office [phone]  Fax [phone]

## 2022-12-25 ENCOUNTER — Other Ambulatory Visit: Payer: Self-pay | Admitting: Internal Medicine

## 2022-12-25 DIAGNOSIS — I10 Essential (primary) hypertension: Secondary | ICD-10-CM

## 2023-01-22 NOTE — Progress Notes (Deleted)
HTN She takes norvasc 5mg  daily, atenolol 50mg  daily, and hydralazine 25mg  BID   Allergic rhinitis Flonase Allegra Advised to use saline nasal spray and neti pot with 3 month follow up  CKD 3b BMP today?  Hx goiterlow TSH TSH today? Ultrasound today?  HLD Ldl 181 2022  On amlodipine atorvastatin 5-10mg    Prediabetes Needs annual A1c  HCM DXA zoster

## 2023-01-30 ENCOUNTER — Ambulatory Visit: Payer: Medicare Other | Admitting: Podiatry

## 2023-01-30 DIAGNOSIS — M722 Plantar fascial fibromatosis: Secondary | ICD-10-CM | POA: Diagnosis not present

## 2023-02-01 DIAGNOSIS — M722 Plantar fascial fibromatosis: Secondary | ICD-10-CM | POA: Diagnosis not present

## 2023-02-01 MED ORDER — BETAMETHASONE SOD PHOS & ACET 6 (3-3) MG/ML IJ SUSP
3.0000 mg | Freq: Once | INTRAMUSCULAR | Status: AC
Start: 2023-02-01 — End: 2023-02-01
  Administered 2023-02-01: 3 mg via INTRA_ARTICULAR

## 2023-02-01 NOTE — Progress Notes (Signed)
Chief Complaint  Patient presents with   Plantar Fasciitis    Pt states she is still having lots of heel pain still, she is still for an injection     Subjective: 87 y.o. female presenting today for evaluation of bilateral heel pain.  Chronic history of plantar fasciitis bilateral.  Requesting injections today.  We have discussed surgery multiple times in the past but she continues to only want conservative care. Patient also requesting nail debridement today.  She says her nails are tender and elongated and painful in shoes.  Past Medical History:  Diagnosis Date   Acute urticaria 12/26/2021   Anxiety    Bronchitis due to COVID-19 virus 03/11/2021   Compression fracture of L3 vertebra ([facility]) 05/13/2017   Dysuria 06/14/2021   Eustachian tube dysfunction, right 04/01/2018   Function kidney decreased 05/13/2017   Hypertension    Impacted cerumen of left ear 04/01/2018   Low back strain, initial encounter 02/22/2021   Mid back pain 05/08/2017   Rash 06/23/2019   Seasonal allergic rhinitis 04/01/2018   Stress and adjustment reaction 05/08/2017   Past Surgical History:  Procedure Laterality Date   ABDOMINAL HYSTERECTOMY     APPENDECTOMY     FOOT SURGERY     HEEL SPUR SURGERY     TONSILLECTOMY     No Known Allergies   Objective: Physical Exam General: The patient is alert and oriented x3 in no acute distress.  Dermatology: Skin is warm, dry and supple bilateral lower extremities. Negative for open lesions or macerations bilateral.  There is some nail dystrophy noted along the medial border of the left hallux nail plate with associated tenderness with palpation  Vascular: Dorsalis Pedis and Posterior Tibial pulses palpable bilateral.  Capillary fill time is immediate to all digits.  Neurological: Grossly active via light touch  Musculoskeletal: There continues to be chronic tenderness to palpation to the plantar aspect of the bilateral heels along the plantar fascia. All  other joints range of motion within normal limits bilateral. Strength 5/5 in all groups bilateral.   MRI impression 08/16/2020 RT foot:  Moderate to moderately severe appearing plantar fasciitis of the medial cord. There is a small interstitial tear in the plantar fascia but no frank rupture. Mild reactive marrow edema in the calcaneus at the plantar fascia attachment noted.  MRI impression 08/16/2020 LT foot:  Mild plantar fasciitis. No plantar fascial tear.  Assessment: 1. Plantar fasciitis bilateral 2.  History of plantar fasciotomy approximately 10+ years ago 3 pain due to onychomycosis of toenails both   Plan of Care:  1. Patient evaluated.  Mechanical debridement of nails 1-5 bilateral was performed using a nail nipper without incident or bleeding 2.  Patient requested injections today now for the right plantar fascia.  Patient does have a chronic history of bilateral plantar fasciitis.  Injection of 0.5 cc Celestone Soluspan injected into the bilateral plantar fascia 3.  Continue wearing good supportive shoes and sneakers and conservative care for her chronic plantar fasciitis since she does not want to pursue any surgery 4.  Return to clinic as needed  Felecia Shelling, DPM [facility]  Dr. Felecia Shelling, DPM    [address], [place] [address]  Office [phone]  Fax [phone]

## 2023-02-08 ENCOUNTER — Ambulatory Visit
Admission: EM | Admit: 2023-02-08 | Discharge: 2023-02-08 | Disposition: A | Discharge status: Home / Self Care | Payer: Medicare Other | Attending: Urgent Care | Admitting: Urgent Care

## 2023-02-08 ENCOUNTER — Ambulatory Visit (INDEPENDENT_AMBULATORY_CARE_PROVIDER_SITE_OTHER): Payer: Medicare Other

## 2023-02-08 DIAGNOSIS — S92355A Nondisplaced fracture of fifth metatarsal bone, left foot, initial encounter for closed fracture: Secondary | ICD-10-CM | POA: Diagnosis not present

## 2023-02-08 DIAGNOSIS — M79671 Pain in right foot: Secondary | ICD-10-CM | POA: Diagnosis not present

## 2023-02-08 DIAGNOSIS — M79672 Pain in left foot: Secondary | ICD-10-CM | POA: Diagnosis not present

## 2023-02-08 DIAGNOSIS — M7989 Other specified soft tissue disorders: Secondary | ICD-10-CM | POA: Diagnosis not present

## 2023-02-08 MED ORDER — ACETAMINOPHEN 325 MG PO TABS: 650.0000 mg | ORAL_TABLET | Freq: Four times a day (QID) | ORAL | 0 refills | Status: AC | PRN

## 2023-02-08 MED ORDER — TRAMADOL HCL 50 MG PO TABS: 50.0000 mg | ORAL_TABLET | Freq: Four times a day (QID) | ORAL | 0 refills | Status: DC | PRN

## 2023-02-08 NOTE — ED Triage Notes (Signed)
Pt presents with c/o lt foot swelling and pain. States she fell in the parking lot at [employer] today around 1115. Presents with a knot to the left side of her lt foot.

## 2023-02-08 NOTE — Discharge Instructions (Signed)
Please make sure you wear the splint at all times.  Make sure you follow-up with your podiatrist as soon as possible.  You can use crutches or bear weight as you can tolerate it from your pain.  Do not use any nonsteroidal anti-inflammatories (NSAIDs) like ibuprofen, Motrin, naproxen, Aleve, etc. which are all available over-the-counter.  Please just use Tylenol at a dose of 650mg  once every 6 hours as needed for your aches, pains, fevers.  Use tramadol for breakthrough pain.  This is a narcotic pain medication and therefore if you do not need it then do not use it.

## 2023-02-08 NOTE — ED Provider Notes (Signed)
[facility]  Note:  This document was prepared using Conservation officer, historic buildings and may include unintentional dictation errors.  MRN: [id number] DOB: 08-03-1936  Subjective:   Megan Robbins is a 87 y.o. female presenting for suffering a left foot injury today while in the parking lot at [employer].  Unfortunately she twisted her ankle and has since had significant pain and swelling of the left side of her left foot.  Also has swelling about the ankle but minimal pain there.    No current facility-administered medications for this encounter.  Current Outpatient Medications:    amLODipine-atorvastatin (CADUET) 5-10 MG tablet, Take 1 tablet by mouth daily., Disp: 30 tablet, Rfl: 11   atenolol (TENORMIN) 50 MG tablet, Take 1 tablet (50 mg total) by mouth daily. TAKE 1 TABLET(50 MG) BY MOUTH DAILY, Disp: 90 tablet, Rfl: 3   fexofenadine (ALLEGRA) 180 MG tablet, Take 1 tablet (180 mg total) by mouth daily., Disp: 90 tablet, Rfl: 3   fluticasone (FLONASE) 50 MCG/ACT nasal spray, Place 1 spray into both nostrils daily., Disp: 16 g, Rfl: 2   hydrALAZINE (APRESOLINE) 25 MG tablet, TAKE 1 TABLET(25 MG) BY MOUTH TWICE DAILY, Disp: 180 tablet, Rfl: 0   Lidocaine (HM LIDOCAINE PATCH) 4 % PTCH, Apply 1 each topically daily. Remove after 12 hours each day., Disp: 15 patch, Rfl: 1   sodium chloride (OCEAN) 0.65 % SOLN nasal spray, Place 1 spray into both nostrils as needed for congestion., Disp: 60 mL, Rfl: 0   No Known Allergies  Past Medical History:  Diagnosis Date   Acute urticaria 12/26/2021   Anxiety    Bronchitis due to COVID-19 virus 03/11/2021   Compression fracture of L3 vertebra (HCC) 05/13/2017   Dysuria 06/14/2021   Eustachian tube dysfunction, right 04/01/2018   Function kidney decreased 05/13/2017   Hypertension    Impacted cerumen of left ear 04/01/2018   Low back strain, initial encounter 02/22/2021   Mid back pain 05/08/2017   Rash 06/23/2019    Seasonal allergic rhinitis 04/01/2018   Stress and adjustment reaction 05/08/2017     Past Surgical History:  Procedure Laterality Date   ABDOMINAL HYSTERECTOMY     APPENDECTOMY     FOOT SURGERY     HEEL SPUR SURGERY     TONSILLECTOMY      Family History  Problem Relation Age of Onset   Hyperlipidemia Mother    Hypertension Mother    Diabetes Daughter    Diabetes Son     Social History   Tobacco Use   Smoking status: Never   Smokeless tobacco: Never  Vaping Use   Vaping Use: Never used  Substance Use Topics   Alcohol use: No   Drug use: Never    ROS   Objective:   Vitals: BP (!) 184/86   Pulse 61   Temp 97.6 F (36.4 C) (Oral)   Resp 15   SpO2 97%   Physical Exam Constitutional:      General: She is not in acute distress.    Appearance: Normal appearance. She is well-developed. She is not ill-appearing, toxic-appearing or diaphoretic.  HENT:     Head: Normocephalic and atraumatic.     Nose: Nose normal.     Mouth/Throat:     Mouth: Mucous membranes are moist.  Eyes:     General: No scleral icterus.       Right eye: No discharge.        Left eye:  No discharge.     Extraocular Movements: Extraocular movements intact.  Cardiovascular:     Rate and Rhythm: Normal rate.  Pulmonary:     Effort: Pulmonary effort is normal.  Musculoskeletal:     Left ankle: Swelling present. No deformity, ecchymosis or lacerations. No tenderness. Normal range of motion.     Left Achilles Tendon: No tenderness or defects. Thompson's test negative.     Left foot: Decreased range of motion. Normal capillary refill. Swelling (focal over areas outlined), tenderness and bony tenderness present. No deformity, laceration or crepitus.       Feet:  Skin:    General: Skin is warm and dry.  Neurological:     General: No focal deficit present.     Mental Status: She is alert and oriented to person, place, and time.  Psychiatric:        Mood and Affect: Mood normal.         Behavior: Behavior normal.    CLINICAL DATA: Foot pain and swelling after a fall.  EXAM: LEFT FOOT - COMPLETE 3+ VIEW  COMPARISON: None Available.  FINDINGS: Transverse fracture identified through the base of the fifth toe metatarsal. No other acute fracture evident. No dislocation. No worrisome lytic or sclerotic osseous abnormality.  IMPRESSION: Transverse fracture through the base of the fifth toe metatarsal.   Electronically Signed By: Kennith Center M.D. On: 02/08/2023 12:52   Patient placed into a short leg splint with the ankle flexed at 90 degrees.  Assessment and Plan :   PDMP not reviewed this encounter.  1. Closed nondisplaced fracture of fifth metatarsal bone of left foot, initial encounter   2. Left foot pain    Unfortunately we do not have crutches onsite to provide to the patient.  Advised that she pick some up as soon as possible.  We did assist the patient with a wheelchair so that she could be nonweightbearing in our clinic.  Recommend she wear the splint at all times.  Use Tylenol for regular pain control.  Tramadol for breakthrough pain.  Follow-up with podiatrist as soon as possible.  Patient has her own and plans on calling them tomorrow.  Counseled patient on potential for adverse effects with medications prescribed/recommended today, ER and return-to-clinic precautions discussed, patient verbalized understanding.    Wallis Bamberg, PA-C 02/08/23 1340

## 2023-02-09 ENCOUNTER — Ambulatory Visit: Payer: Medicare Other | Admitting: Podiatry

## 2023-02-09 DIAGNOSIS — S92355A Nondisplaced fracture of fifth metatarsal bone, left foot, initial encounter for closed fracture: Secondary | ICD-10-CM | POA: Diagnosis not present

## 2023-02-09 NOTE — Progress Notes (Signed)
   No chief complaint on file.   HPI: 87 y.o. female presenting today for new complaint regarding a fracture that was sustained yesterday, 02/08/2023 at her home.  Patient states that she fell and injured her left foot at home.  Went to the urgent care diagnosed with fracture.  Presenting as an urgent work in.  She is currently posterior splinted and in a wheelchair  Past Medical History:  Diagnosis Date   Acute urticaria 12/26/2021   Anxiety    Bronchitis due to COVID-19 virus 03/11/2021   Compression fracture of L3 vertebra (HCC) 05/13/2017   Dysuria 06/14/2021   Eustachian tube dysfunction, right 04/01/2018   Function kidney decreased 05/13/2017   Hypertension    Impacted cerumen of left ear 04/01/2018   Low back strain, initial encounter 02/22/2021   Mid back pain 05/08/2017   Rash 06/23/2019   Seasonal allergic rhinitis 04/01/2018   Stress and adjustment reaction 05/08/2017    Past Surgical History:  Procedure Laterality Date   ABDOMINAL HYSTERECTOMY     APPENDECTOMY     FOOT SURGERY     HEEL SPUR SURGERY     TONSILLECTOMY      No Known Allergies   Physical Exam: General: The patient is alert and oriented x3 in no acute distress.  Dermatology: Skin is warm, dry and supple bilateral lower extremities.   Vascular: Palpable pedal pulses bilaterally. Capillary refill within normal limits.  Moderate min noted especially around the lateral aspect of the left foot  Neurological: Grossly intact via light touch  Musculoskeletal Exam: No gross pedal deformity.  The tenderness to the foot is localized around the fifth metatarsal tubercle  Radiographic Exam LT foot 02/08/2023 [facility]:  FINDINGS: Transverse fracture identified through the base of the fifth toe metatarsal. No other acute fracture evident. No dislocation. No worrisome lytic or sclerotic osseous abnormality.   IMPRESSION: Transverse fracture through the base of the fifth toe metatarsal.  Assessment/Plan  of Care: 1.  Transverse fracture base of the fifth metatarsal LT; minimally displaced.  DOI: 02/08/2023 2.  Chronic plantar fasciitis bilateral  -Patient evaluated.  X-rays taken yesterday reviewed -For now pursue conservative treatment.  NWB in the cam boot.  Cam boot dispensed -Patient has a bedside commode, walker, and wheelchair.  Utilize as needed to be nonweightbearing to the extremity -Return to clinic 4 weeks follow-up x-ray       Felecia Shelling, DPM [facility]  Dr. Felecia Shelling, DPM    [address] [place], [place] [address]                Office [phone]  Fax [phone]

## 2023-02-16 ENCOUNTER — Telehealth: Payer: Self-pay | Admitting: Podiatry

## 2023-02-16 NOTE — Telephone Encounter (Signed)
Patient called and asked if she can drive in her boot.  Has a left 5th met base fracture and was immobilzed in early July for 4 weeks.  She has little pain.    Since it's her left foot that has the boot on, and she does not drive a stick shift, I informed her she can drive short distances to the store, etc., but don't be on the foot too much.    She didn't want Dr. Logan Bores to yell at her, especially since she was his first patient!  Told her I'd let him know she was asking about it.

## 2023-03-16 ENCOUNTER — Ambulatory Visit (INDEPENDENT_AMBULATORY_CARE_PROVIDER_SITE_OTHER): Payer: Medicare Other

## 2023-03-16 ENCOUNTER — Ambulatory Visit: Payer: Medicare Other | Admitting: Podiatry

## 2023-03-16 DIAGNOSIS — S92355A Nondisplaced fracture of fifth metatarsal bone, left foot, initial encounter for closed fracture: Secondary | ICD-10-CM

## 2023-03-16 DIAGNOSIS — B351 Tinea unguium: Secondary | ICD-10-CM

## 2023-03-16 DIAGNOSIS — M79675 Pain in left toe(s): Secondary | ICD-10-CM

## 2023-03-16 DIAGNOSIS — M79674 Pain in right toe(s): Secondary | ICD-10-CM

## 2023-03-16 NOTE — Progress Notes (Signed)
Chief Complaint  Patient presents with   Plantar Fasciitis    L foot fx 1 mo f/u    HPI: 87 y.o. female presenting today for follow-up evaluation regarding a fracture that was sustained 02/08/2023 at her home.  Patient states that she fell and injured her left foot at home.  Went to the urgent care diagnosed with fracture.  We have been treating the patient conservatively in a cam boot.  She is doing well.  She says that she actually has been walking around the house without the cam boot with no pain.  Patient also requesting her nails to be debrided today.  She is unable to trim her own nails and they are becoming symptomatic especially in close toed shoes.  Past Medical History:  Diagnosis Date   Acute urticaria 12/26/2021   Anxiety    Bronchitis due to COVID-19 virus 03/11/2021   Compression fracture of L3 vertebra (HCC) 05/13/2017   Dysuria 06/14/2021   Eustachian tube dysfunction, right 04/01/2018   Function kidney decreased 05/13/2017   Hypertension    Impacted cerumen of left ear 04/01/2018   Low back strain, initial encounter 02/22/2021   Mid back pain 05/08/2017   Rash 06/23/2019   Seasonal allergic rhinitis 04/01/2018   Stress and adjustment reaction 05/08/2017    Past Surgical History:  Procedure Laterality Date   ABDOMINAL HYSTERECTOMY     APPENDECTOMY     FOOT SURGERY     HEEL SPUR SURGERY     TONSILLECTOMY      No Known Allergies   Physical Exam: General: The patient is alert and oriented x3 in no acute distress.  Dermatology: Skin is warm, dry and supple bilateral lower extremities.  Hyperkeratotic elongated nails noted 1-5 bilateral  Vascular: Palpable pedal pulses bilaterally. Capillary refill within normal limits.  No edema.  No erythema.  Neurological: Grossly intact via light touch  Musculoskeletal Exam: No gross pedal deformity.  There continues to be some tenderness around the fifth metatarsal tubercle  Radiographic Exam LT foot 02/08/2023 [facility]:  FINDINGS: Transverse fracture identified through the base of the fifth toe metatarsal. No other acute fracture evident. No dislocation. No worrisome lytic or sclerotic osseous abnormality. IMPRESSION: Transverse fracture through the base of the fifth toe metatarsal.  Radiographic exam LT foot 03/16/2023: There continues to be small transverse avulsion fracture of the fifth metatarsal tubercle.  Stable.  Assessment/Plan of Care: 1.  Transverse fracture base of the fifth metatarsal LT; minimally displaced.  DOI: 02/08/2023 2.  Chronic plantar fasciitis bilateral 3.  Pain due to onychomycosis of toenails both  -Patient evaluated.  X-rays taken and compared to prior x-rays -Fracture appears somewhat stable.  High chance of nonunion however the patient states that she is feeling much better -Stressed the importance of continuing the cam boot.  WBAT -Mechanical debridement of nails 1-5 bilateral was performed using a nail nipper without incident or bleeding -Return to clinic 8 weeks follow-up x-ray left foot        Felecia Shelling, DPM [facility]  Dr. Felecia Shelling, DPM    [address] [place] [address]                Office [phone]  [phone]  Fax [phone]

## 2023-04-22 ENCOUNTER — Ambulatory Visit: Payer: Medicare Other | Admitting: Podiatry

## 2023-04-22 ENCOUNTER — Ambulatory Visit (INDEPENDENT_AMBULATORY_CARE_PROVIDER_SITE_OTHER): Payer: Medicare Other

## 2023-04-22 DIAGNOSIS — S92355A Nondisplaced fracture of fifth metatarsal bone, left foot, initial encounter for closed fracture: Secondary | ICD-10-CM

## 2023-04-22 DIAGNOSIS — M722 Plantar fascial fibromatosis: Secondary | ICD-10-CM | POA: Diagnosis not present

## 2023-04-22 NOTE — Progress Notes (Signed)
   Chief Complaint  Patient presents with   Plantar Fasciitis    Pt presents for a pf follow up pt states that she doing well. Pt has no other complaints     HPI: 87 y.o. female presenting today for follow-up evaluation regarding a fracture that was sustained 02/08/2023 at her home.  The patient has no pain.  She says that she is doing much better.  Long history of plantar fasciitis bilateral.  Requesting injections today.   Past Medical History:  Diagnosis Date   Acute urticaria 12/26/2021   Anxiety    Bronchitis due to COVID-19 virus 03/11/2021   Compression fracture of L3 vertebra (HCC) 05/13/2017   Dysuria 06/14/2021   Eustachian tube dysfunction, right 04/01/2018   Function kidney decreased 05/13/2017   Hypertension    Impacted cerumen of left ear 04/01/2018   Low back strain, initial encounter 02/22/2021   Mid back pain 05/08/2017   Rash 06/23/2019   Seasonal allergic rhinitis 04/01/2018   Stress and adjustment reaction 05/08/2017    Past Surgical History:  Procedure Laterality Date   ABDOMINAL HYSTERECTOMY     APPENDECTOMY     FOOT SURGERY     HEEL SPUR SURGERY     TONSILLECTOMY      No Known Allergies   Physical Exam: General: The patient is alert and oriented x3 in no acute distress.  Dermatology: Skin is warm, dry and supple bilateral lower extremities.    Vascular: Palpable pedal pulses bilaterally. Capillary refill within normal limits.  No edema.  No erythema.  Neurological: Grossly intact via light touch  Musculoskeletal Exam: No gross pedal deformity.  No pain or tenderness around the fifth metatarsal tubercle.  There is pain with palpation to the plantar heels bilateral  Radiographic Exam LT foot 02/08/2023 [facility]:  FINDINGS: Transverse fracture identified through the base of the fifth toe metatarsal. No other acute fracture evident. No dislocation. No worrisome lytic or sclerotic osseous abnormality. IMPRESSION: Transverse fracture through  the base of the fifth toe metatarsal.  Radiographic exam LT foot 03/16/2023: There continues to be small transverse avulsion fracture of the fifth metatarsal tubercle.  Stable.  Assessment/Plan of Care: 1.  Transverse fracture base of the fifth metatarsal LT; minimally displaced.  DOI: 02/08/2023; improved 2.  Chronic plantar fasciitis bilateral   -Patient evaluated.  X-rays taken and compared to prior x-rays -Fracture appears somewhat stable.  High chance of nonunion however the patient has no pain or tenderness associated to this area - Patient has now been in the boot for about 2 months.  She may begin to transition out of the boot into good supportive tennis shoes and sneakers depending on toleration -Injection of 0.5 cc Celestone Soluspan injected in bilateral plantar fascia -Return to clinic as needed        Felecia Shelling, DPM [facility]  Dr. Felecia Shelling, DPM    [address] [place], [place] [address]                Office [phone]  Fax [phone]

## 2023-05-03 DIAGNOSIS — M722 Plantar fascial fibromatosis: Secondary | ICD-10-CM | POA: Diagnosis not present

## 2023-05-03 MED ORDER — BETAMETHASONE SOD PHOS & ACET 6 (3-3) MG/ML IJ SUSP
3.0000 mg | Freq: Once | INTRAMUSCULAR | Status: AC
Start: 2023-05-03 — End: 2023-05-03
  Administered 2023-05-03: 3 mg via INTRA_ARTICULAR

## 2023-05-11 ENCOUNTER — Ambulatory Visit: Payer: Medicare Other | Admitting: Podiatry

## 2023-05-27 ENCOUNTER — Ambulatory Visit (HOSPITAL_COMMUNITY)
Admission: EM | Admit: 2023-05-27 | Discharge: 2023-05-27 | Disposition: A | Discharge status: Home / Self Care | Payer: Medicare Other | Attending: Physician Assistant | Admitting: Physician Assistant

## 2023-05-27 ENCOUNTER — Ambulatory Visit (INDEPENDENT_AMBULATORY_CARE_PROVIDER_SITE_OTHER): Payer: Medicare Other

## 2023-05-27 ENCOUNTER — Encounter (HOSPITAL_COMMUNITY): Payer: Self-pay | Admitting: Emergency Medicine

## 2023-05-27 DIAGNOSIS — J40 Bronchitis, not specified as acute or chronic: Secondary | ICD-10-CM | POA: Diagnosis not present

## 2023-05-27 DIAGNOSIS — I10 Essential (primary) hypertension: Secondary | ICD-10-CM

## 2023-05-27 DIAGNOSIS — M158 Other polyosteoarthritis: Secondary | ICD-10-CM | POA: Diagnosis not present

## 2023-05-27 DIAGNOSIS — J329 Chronic sinusitis, unspecified: Secondary | ICD-10-CM

## 2023-05-27 DIAGNOSIS — M1812 Unilateral primary osteoarthritis of first carpometacarpal joint, left hand: Secondary | ICD-10-CM | POA: Diagnosis not present

## 2023-05-27 DIAGNOSIS — M7989 Other specified soft tissue disorders: Secondary | ICD-10-CM | POA: Diagnosis not present

## 2023-05-27 DIAGNOSIS — M79645 Pain in left finger(s): Secondary | ICD-10-CM | POA: Diagnosis not present

## 2023-05-27 MED ORDER — DICLOFENAC SODIUM 1 % EX GEL: 2.0000 g | Freq: Four times a day (QID) | CUTANEOUS | 0 refills | Status: AC

## 2023-05-27 MED ORDER — DOXYCYCLINE HYCLATE 100 MG PO CAPS: 100.0000 mg | ORAL_CAPSULE | Freq: Two times a day (BID) | ORAL | 0 refills | Status: DC

## 2023-05-27 NOTE — Discharge Instructions (Signed)
We are treating you for an infection in your sinuses.  Take doxycycline 100 mg twice daily for 10 days.  Stay out of the sun while on this medication.  Continue over-the-counter medicine such as Mucinex, Flonase, Tylenol.  Make sure that you are resting and drinking plenty of fluid.  If your symptoms are not improving please return for reevaluation.  I will call you if your x-ray shows something that we missed.  As we discussed, I think you have some arthritis.  Apply diclofenac gel up to 4 times a day.  Take Tylenol.  Follow-up with your primary care.  Your blood pressure is elevated.  Please continue your medication as previously prescribed.  Avoid decongestants, caffeine, sodium, NSAIDs (aspirin, ibuprofen/Advil, naproxen/Aleve).  If you develop any chest pain, shortness of breath, headache, vision change, dizziness in setting of high blood pressure you need to go to the emergency room.  Follow-up with your primary care as soon as possible.

## 2023-05-27 NOTE — ED Triage Notes (Addendum)
Pt states husband had COVID and pneumonia about 3 weeks ago.  Today pt c/o cough, drainage, sore throat, and congestion since yesterday  Pt also c/o left hand thumb pain and swelling that started a few weeks ago. Denies any injury.

## 2023-05-27 NOTE — ED Provider Notes (Signed)
[facility]    CSN: [id number] Arrival date & time: 05/27/23  1132      History   Chief Complaint No chief complaint on file.   HPI Megan Robbins is a 87 y.o. female.   Patient presents today with several concerns.  Her primary concern today is a several day history of worsening congestion symptoms.  She reports that 3 weeks ago her husband had COVID and pneumonia.  She initially had some mild allergy symptoms and went to the pharmacy where they told her to change her medication.  She thought that this improved her symptoms but then had worsening over the past several days.  She is experiencing cough, drainage, sore throat, congestion.  Denies any fever, chest pain, shortness of breath, nausea, vomiting.  She has not had COVID-19 vaccines.  She has not had COVID that she knows of in the past.  She denies any recent antibiotics or steroids.  She denies history of asthma, allergies, COPD.  She does not smoke.  In addition, she reports a several week history of left thumb pain.  She denies any known injury but reports that she thinks that she slept on it incorrectly and this caused the symptoms.  She reports pain at both the MCP and interphalangeal joint.  She denies history of rheumatoid or osteoarthritis.  She has tried acetaminophen without improvement of symptoms.  She denies any numbness or paresthesias.  She is right-handed.  Her blood pressure is elevated.  She does have a history of essential hypertension and is prescribed amlodipine, atenolol, hydralazine.  She denies any chest pain, shortness of breath, headache, vision change, dizziness.  She denies any NSAID use, decongestants, caffeine, sodium.    Past Medical History:  Diagnosis Date   Acute urticaria 12/26/2021   Anxiety    Bronchitis due to COVID-19 virus 03/11/2021   Compression fracture of L3 vertebra (HCC) 05/13/2017   Dysuria 06/14/2021   Eustachian tube dysfunction, right 04/01/2018   Function kidney  decreased 05/13/2017   Hypertension    Impacted cerumen of left ear 04/01/2018   Low back strain, initial encounter 02/22/2021   Mid back pain 05/08/2017   Rash 06/23/2019   Seasonal allergic rhinitis 04/01/2018   Stress and adjustment reaction 05/08/2017    Patient Active Problem List   Diagnosis Date Noted   Allergic rhinitis 10/09/2022   Sinusitis 10/09/2022   Goals of care, counseling/discussion 09/20/2022   Need for vaccination 08/14/2022   Low TSH level 12/26/2021   Stage 3b chronic kidney disease (HCC) 12/26/2021   Anemia 12/26/2021   Goiter 12/26/2021   Hyperlipidemia 12/26/2021   Prediabetes 12/26/2021   Overweight 05/23/2015   Essential hypertension 05/08/2015    Past Surgical History:  Procedure Laterality Date   ABDOMINAL HYSTERECTOMY     APPENDECTOMY     FOOT SURGERY     HEEL SPUR SURGERY     TONSILLECTOMY      OB History   No obstetric history on file.      Home Medications    Prior to Admission medications   Medication Sig Start Date End Date Taking? Authorizing Provider  diclofenac Sodium (VOLTAREN) 1 % GEL Apply 2 g topically 4 (four) times daily. 05/27/23  Yes Jeslie Lowe K, PA-C  doxycycline (VIBRAMYCIN) 100 MG capsule Take 1 capsule (100 mg total) by mouth 2 (two) times daily. 05/27/23  Yes Bindu Docter, Noberto Retort, PA-C  acetaminophen (TYLENOL) 325 MG tablet Take 2 tablets (650 mg total) by mouth every  6 (six) hours as needed for moderate pain. Patient not taking: Reported on 05/27/2023 02/08/23   Wallis Bamberg, PA-C  amLODipine-atorvastatin (CADUET) 5-10 MG tablet Take 1 tablet by mouth daily. 09/17/22 09/17/23  Gwenevere Abbot, MD  atenolol (TENORMIN) 50 MG tablet Take 1 tablet (50 mg total) by mouth daily. TAKE 1 TABLET(50 MG) BY MOUTH DAILY 09/17/22 09/17/23  Gwenevere Abbot, MD  fexofenadine (ALLEGRA) 180 MG tablet Take 1 tablet (180 mg total) by mouth daily. 06/23/19   Corwin Levins, MD  fluticasone (FLONASE) 50 MCG/ACT nasal spray Place 1 spray into both  nostrils daily. 04/14/18   Kozlow, Alvira Philips, MD  hydrALAZINE (APRESOLINE) 25 MG tablet TAKE 1 TABLET(25 MG) BY MOUTH TWICE DAILY 12/25/22   Quincy Simmonds, MD  Lidocaine (HM LIDOCAINE PATCH) 4 % PTCH Apply 1 each topically daily. Remove after 12 hours each day. 04/06/20   Brimage, Seward Meth, DO  sodium chloride (OCEAN) 0.65 % SOLN nasal spray Place 1 spray into both nostrils as needed for congestion. Patient not taking: Reported on 05/27/2023 10/15/22   Briscoe Burns, MD  traMADol (ULTRAM) 50 MG tablet Take 1 tablet (50 mg total) by mouth every 6 (six) hours as needed. 02/08/23   Wallis Bamberg, PA-C    Family History Family History  Problem Relation Age of Onset   Hyperlipidemia Mother    Hypertension Mother    Diabetes Daughter    Diabetes Son     Social History Social History   Tobacco Use   Smoking status: Never   Smokeless tobacco: Never  Vaping Use   Vaping status: Never Used  Substance Use Topics   Alcohol use: No   Drug use: Never     Allergies   Patient has no known allergies.   Review of Systems Review of Systems  Constitutional:  Positive for activity change. Negative for appetite change, fatigue and fever.  HENT:  Positive for congestion, postnasal drip, sinus pressure and sore throat. Negative for sneezing.   Respiratory:  Positive for cough. Negative for shortness of breath.   Cardiovascular:  Negative for chest pain.  Gastrointestinal:  Negative for abdominal pain, diarrhea, nausea and vomiting.  Musculoskeletal:  Positive for arthralgias (left thumb). Negative for myalgias.  Neurological:  Negative for dizziness, light-headedness and headaches.     Physical Exam Triage Vital Signs ED Triage Vitals  Encounter Vitals Group     BP 05/27/23 1202 (!) 177/112     Systolic BP Percentile --      Diastolic BP Percentile --      Pulse Rate 05/27/23 1202 72     Resp 05/27/23 1202 16     Temp 05/27/23 1202 98.5 F (36.9 C)     Temp Source 05/27/23 1202 Oral     SpO2  05/27/23 1202 96 %     Weight --      Height --      Head Circumference --      Peak Flow --      Pain Score 05/27/23 1200 3     Pain Loc --      Pain Education --      Exclude from Growth Chart --    No data found.  Updated Vital Signs BP (!) 177/112 (BP Location: Left Arm)   Pulse 72   Temp 98.5 F (36.9 C) (Oral)   Resp 16   SpO2 96%   Visual Acuity Right Eye Distance:   Left Eye Distance:   Bilateral Distance:  Right Eye Near:   Left Eye Near:    Bilateral Near:     Physical Exam Vitals reviewed.  Constitutional:      General: She is awake. She is not in acute distress.    Appearance: Normal appearance. She is well-developed. She is not ill-appearing.     Comments: Very pleasant female appears stated age in no acute distress sitting comfortably in exam room  HENT:     Head: Normocephalic and atraumatic.     Right Ear: Tympanic membrane, ear canal and external ear normal. Tympanic membrane is not erythematous or bulging.     Left Ear: Tympanic membrane, ear canal and external ear normal. Tympanic membrane is not erythematous or bulging.     Nose:     Right Sinus: Maxillary sinus tenderness and frontal sinus tenderness present.     Left Sinus: Maxillary sinus tenderness and frontal sinus tenderness present.     Mouth/Throat:     Pharynx: Uvula midline. Postnasal drip present. No oropharyngeal exudate or posterior oropharyngeal erythema.  Cardiovascular:     Rate and Rhythm: Normal rate and regular rhythm.     Heart sounds: Normal heart sounds, S1 normal and S2 normal. No murmur heard. Pulmonary:     Effort: Pulmonary effort is normal.     Breath sounds: Normal breath sounds. No wheezing, rhonchi or rales.     Comments: Clear to auscultation bilaterally Musculoskeletal:     Left hand: Swelling, tenderness and bony tenderness present. Decreased range of motion. There is no disruption of two-point discrimination. Normal capillary refill.     Comments: Left thumb:  Swelling noted at interphalangeal joint and MCP joint.  Decreased range of motion with flexion and extension.  Fingers neurovascularly intact.  Psychiatric:        Behavior: Behavior is cooperative.      UC Treatments / Results  Labs (all labs ordered are listed, but only abnormal results are displayed) Labs Reviewed - No data to display  EKG   Radiology No results found.  Procedures Procedures (including critical care time)  Medications Ordered in UC Medications - No data to display  Initial Impression / Assessment and Plan / UC Course  I have reviewed the triage vital signs and the nursing notes.  Pertinent labs & imaging results that were available during my care of the patient were reviewed by me and considered in my medical decision making (see chart for details).     Patient is well-appearing, afebrile, nontoxic, nontachycardic.  She reports that she has had some congestion symptoms for over a week so viral testing was deferred.  Chest x-ray was deferred as she had no adventitious lung sounds on exam and oxygen saturation was 96%.  Will treat for sinobronchiti with doxycycline 100 mg twice daily for 10 days.  We discussed that she should avoid prolonged sun exposure while on this medication due to photosensitivity.  She can use over-the-counter medications including Mucinex, Flonase, Tylenol.  Also recommend that she use nasal saline and sinus rinses as well as rest and drink plenty of fluid.  If her symptoms are not improving within 3 to 5 days she is to return for reevaluation.  If she has any worsening symptoms including worsening cough, shortness of breath, chest pain, fever, nausea, vomiting she needs to be seen immediately.  We did discuss potential utility of short course of prednisone to help with both this and her thumb pain but she declined this due to concern for side effects.  X-ray of her thumb was obtained that showed degenerative changes without acute abnormality  based on my primary read.  We are still waiting for radiologist over read at the time of discharge and we will contact her if this is abnormal.  Offered thumb spica splint to help manage symptoms but she declined this.  She can continue taking Tylenol.  She is to avoid NSAIDs given her history of chronic kidney disease and elevated blood pressure so was given topical Voltaren.  We did discuss potential utility of a short course of prednisone but she declined this due to concern for side effects.  Recommend she follow-up with her primary care.  Discussed that if anything worsens she needs to return for reevaluation.  Her blood pressure is elevated today.  She suspects this is related to being sick.  She denies any signs/symptoms of endorgan damage.  Reports compliance with her medication.  She was encouraged to avoid decongestants, caffeine, sodium, NSAIDs.  Recommend she monitor her blood pressure at home and if this is persistently elevated return here or see her primary care for medication adjustment.  Discussed that if she develops any chest pain, shortness of breath, headache, vision change, dizziness in setting of high blood pressure she needs to go to the emergency room.  She is to follow-up closely with her primary care.   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  Take doxycycline 100 mg twice daily for 10 days.  Stay out of the sun while on this medication.  Continue over-the-counter medicine such as Mucinex, Flonase, Tylenol.  Make sure that you are resting and drinking plenty of fluid.  If your symptoms are not improving please return for reevaluation.  I will call you if your x-ray shows something that we  missed.  As we discussed, I think you have some arthritis.  Apply diclofenac gel up to 4 times a day.  Take Tylenol.  Follow-up with your primary care.  Your blood pressure is elevated.  Please continue your medication as previously prescribed.  Avoid decongestants, caffeine, sodium, NSAIDs (aspirin, ibuprofen/Advil, naproxen/Aleve).  If you develop any chest pain, shortness of breath, headache, vision change, dizziness in setting of high blood pressure you need to go to the emergency room.  Follow-up with your primary care as soon as possible.     ED Prescriptions     Medication Sig Dispense Auth. Provider   doxycycline (VIBRAMYCIN) 100 MG capsule Take 1 capsule (100 mg total) by mouth 2 (two) times daily. 20 capsule Kalven Ganim K, PA-C   diclofenac Sodium (VOLTAREN) 1 % GEL Apply 2 g topically 4 (four) times daily. 100 g [place] Athanas K, PA-C      PDMP not reviewed this encounter.   Jeani Hawking, PA-C 05/27/23 1313

## 2023-06-02 ENCOUNTER — Encounter: Payer: Medicare Other | Admitting: Student

## 2023-06-17 ENCOUNTER — Encounter: Payer: Medicare Other | Admitting: Student

## 2023-07-08 ENCOUNTER — Other Ambulatory Visit: Payer: Medicare Other | Admitting: *Deleted

## 2023-07-08 DIAGNOSIS — Z23 Encounter for immunization: Secondary | ICD-10-CM

## 2023-07-13 ENCOUNTER — Ambulatory Visit: Payer: Medicare Other | Admitting: Podiatry

## 2023-07-13 ENCOUNTER — Encounter: Payer: Self-pay | Admitting: Podiatry

## 2023-07-13 ENCOUNTER — Encounter: Payer: Medicare Other | Admitting: Internal Medicine

## 2023-07-13 DIAGNOSIS — M722 Plantar fascial fibromatosis: Secondary | ICD-10-CM

## 2023-07-13 DIAGNOSIS — M79675 Pain in left toe(s): Secondary | ICD-10-CM

## 2023-07-13 DIAGNOSIS — M79674 Pain in right toe(s): Secondary | ICD-10-CM | POA: Diagnosis not present

## 2023-07-13 DIAGNOSIS — B351 Tinea unguium: Secondary | ICD-10-CM

## 2023-07-13 MED ORDER — BETAMETHASONE SOD PHOS & ACET 6 (3-3) MG/ML IJ SUSP
3.0000 mg | Freq: Once | INTRAMUSCULAR | Status: AC
Start: 2023-07-13 — End: 2023-07-13
  Administered 2023-07-13: 3 mg via INTRA_ARTICULAR

## 2023-07-13 NOTE — Progress Notes (Signed)
   No chief complaint on file.   HPI: 87 y.o. female presenting today for follow-up evaluation of chronic plantar fasciitis to the bilateral feet.  Ongoing for several years.  She comes in routinely for cortisone injections when pain increases.  We have scheduled surgery in the past but she continues to x-ray the surgery.  She is continued about having surgery in January 2025 to correct for the chronic plantar fasciitis  Patient also requesting nail debridement today.  Her nails are elongated and thickened and tender 1-5 bilateral   Past Medical History:  Diagnosis Date   Acute urticaria 12/26/2021   Anxiety    Bronchitis due to COVID-19 virus 03/11/2021   Compression fracture of L3 vertebra (HCC) 05/13/2017   Dysuria 06/14/2021   Eustachian tube dysfunction, right 04/01/2018   Function kidney decreased 05/13/2017   Hypertension    Impacted cerumen of left ear 04/01/2018   Low back strain, initial encounter 02/22/2021   Mid back pain 05/08/2017   Rash 06/23/2019   Seasonal allergic rhinitis 04/01/2018   Stress and adjustment reaction 05/08/2017    Past Surgical History:  Procedure Laterality Date   ABDOMINAL HYSTERECTOMY     APPENDECTOMY     FOOT SURGERY     HEEL SPUR SURGERY     TONSILLECTOMY      No Known Allergies   Physical Exam: General: The patient is alert and oriented x3 in no acute distress.  Dermatology: Skin is warm, dry and supple bilateral lower extremities.  Nails are elongated and thickened 1-5 bilateral with associated tenderness  Vascular: Palpable pedal pulses bilaterally. Capillary refill within normal limits.  No edema.  No erythema.  Neurological: Grossly intact via light touch  Musculoskeletal Exam: No gross pedal deformity.  There is pain with palpation to the plantar heels bilateral   Assessment/Plan of Care: 1.  Transverse fracture base of the fifth metatarsal LT; minimally displaced.  DOI: 02/08/2023; healed 2.  Chronic plantar fasciitis  bilateral 3.  Pain due to onychomycosis of toenails both   -Patient evaluated.   -Injection of 0.5 cc Celestone Soluspan injected in bilateral plantar fascia -Again today we did discuss the possibility of surgery to correct for the chronic plantar fasciitis.  She has been seeing me for about 8 years and surgeries been scheduled multiple times but canceled.  The patient is thinking of having surgery in January 2025 -Mechanical debridement of nails 1-5 bilateral was performed using a nail nipper without incident or bleeding.  Smoothed with a rotary bur -Return to clinic as needed        Felecia Shelling, DPM [facility]  Dr. Felecia Shelling, DPM    [address] [place], [place] [address]                Office [phone]  Fax [phone]

## 2023-07-13 NOTE — Progress Notes (Unsigned)
HTN BP today ***. Current regimen amlodipine-atorvastatin 5-10 mg daily, hydralazine 25 mg BID, atenolol 50 mg daily Plan: Continue current regimen.  ALLERGIES  CKD 3B No recent labs (last noted 06/2021). Plan:  HLD Continue amlodipine-atorvastatin 5-10 mg daily.  ANEMI  LOW TSH?   PREDM  HCM Tdap, shingles, DEXA scan.

## 2023-07-23 ENCOUNTER — Encounter: Payer: Medicare Other | Admitting: Internal Medicine

## 2023-07-23 NOTE — Progress Notes (Deleted)
   CC: follow up  HPI:  [name] is a 87 y.o. with medical history of HTN, HLD, plantar fasciitis, allergic rhinitis  presenting to [facility] for follow up.  Please see problem-based list for further details, assessments, and plans.  Past Medical History:  Diagnosis Date   Acute urticaria 12/26/2021   Anxiety    Bronchitis due to COVID-19 virus 03/11/2021   Compression fracture of L3 vertebra (HCC) 05/13/2017   Dysuria 06/14/2021   Eustachian tube dysfunction, right 04/01/2018   Function kidney decreased 05/13/2017   Hypertension    Impacted cerumen of left ear 04/01/2018   Low back strain, initial encounter 02/22/2021   Mid back pain 05/08/2017   Rash 06/23/2019   Seasonal allergic rhinitis 04/01/2018   Stress and adjustment reaction 05/08/2017     Current Outpatient Medications (Cardiovascular):    amLODipine-atorvastatin (CADUET) 5-10 MG tablet, Take 1 tablet by mouth daily.   atenolol (TENORMIN) 50 MG tablet, Take 1 tablet (50 mg total) by mouth daily. TAKE 1 TABLET(50 MG) BY MOUTH DAILY   hydrALAZINE (APRESOLINE) 25 MG tablet, TAKE 1 TABLET(25 MG) BY MOUTH TWICE DAILY  Current Outpatient Medications (Respiratory):    fexofenadine (ALLEGRA) 180 MG tablet, Take 1 tablet (180 mg total) by mouth daily.   fluticasone (FLONASE) 50 MCG/ACT nasal spray, Place 1 spray into both nostrils daily.   sodium chloride (OCEAN) 0.65 % SOLN nasal spray, Place 1 spray into both nostrils as needed for congestion.  Current Outpatient Medications (Analgesics):    acetaminophen (TYLENOL) 325 MG tablet, Take 2 tablets (650 mg total) by mouth every 6 (six) hours as needed for moderate pain.   traMADol (ULTRAM) 50 MG tablet, Take 1 tablet (50 mg total) by mouth every 6 (six) hours as needed.   Current Outpatient Medications (Other):    diclofenac Sodium (VOLTAREN) 1 % GEL, Apply 2 g topically 4 (four) times daily.   doxycycline (VIBRAMYCIN) 100 MG capsule, Take 1 capsule (100 mg total) by  mouth 2 (two) times daily.   Lidocaine (HM LIDOCAINE PATCH) 4 % PTCH, Apply 1 each topically daily. Remove after 12 hours each day.  Review of Systems:  Review of system negative unless stated in the problem list or HPI.    Physical Exam:  There were no vitals filed for this visit. Physical Exam General: NAD HENT: NCAT Lungs: CTAB, no wheeze, rhonchi or rales.  Cardiovascular: Normal heart sounds, no r/m/g, 2+ pulses in all extremities. No LE edema Abdomen: No TTP, normal bowel sounds MSK: No asymmetry or muscle atrophy.  Skin: no lesions noted on exposed skin Neuro: Alert and oriented x4. CN grossly intact Psych: Normal mood and normal affect   Assessment & Plan:   No problem-specific Assessment & Plan notes found for this encounter.   See Encounters Tab for problem based charting.  Patient Discussed with Dr. {NAMES:[id number]::"Guilloud","Hoffman","Mullen","Narendra","Vincent","Machen","Lau","Hatcher","Williams"} Gwenevere Abbot, MD Eligha Bridegroom. [facility] Medicine Residency, PGY-3

## 2023-08-14 ENCOUNTER — Encounter: Payer: Medicare Other | Admitting: Internal Medicine

## 2023-08-28 ENCOUNTER — Encounter: Payer: Self-pay | Admitting: Student

## 2023-08-28 ENCOUNTER — Ambulatory Visit (INDEPENDENT_AMBULATORY_CARE_PROVIDER_SITE_OTHER): Payer: Medicare Other | Admitting: Internal Medicine

## 2023-08-28 VITALS — BP 155/75 | HR 67 | Temp 97.6°F | Ht 62.0 in | Wt 164.1 lb

## 2023-08-28 DIAGNOSIS — I129 Hypertensive chronic kidney disease with stage 1 through stage 4 chronic kidney disease, or unspecified chronic kidney disease: Secondary | ICD-10-CM | POA: Diagnosis not present

## 2023-08-28 DIAGNOSIS — E7841 Elevated Lipoprotein(a): Secondary | ICD-10-CM | POA: Diagnosis not present

## 2023-08-28 DIAGNOSIS — R7989 Other specified abnormal findings of blood chemistry: Secondary | ICD-10-CM | POA: Diagnosis not present

## 2023-08-28 DIAGNOSIS — N1832 Chronic kidney disease, stage 3b: Secondary | ICD-10-CM | POA: Diagnosis not present

## 2023-08-28 DIAGNOSIS — I10 Essential (primary) hypertension: Secondary | ICD-10-CM

## 2023-08-28 NOTE — Patient Instructions (Signed)
Ms. Boston it was wonderful to see you today, and I look forward to seeing you around the clinic!  We will have you return for some blood work in the next couple of weeks.  This is important to monitor your kidney function, your cholesterol, and your thyroid function test, all in an effort to make sure your medicines are working for you to keep you healthy!  Take care and stay warm!  Dr. Mayford Knife

## 2023-08-28 NOTE — Assessment & Plan Note (Signed)
Last renal function assessed in 05/2021 - I didn't realize that at last visit she also declined blood draw.  If she doesn't come in for lab only visit as she promised she would do (declined today), I will call her back in.

## 2023-08-28 NOTE — Assessment & Plan Note (Signed)
Not at goal: SBP 150s on hydralazine 25 mg bid, amlodipine-atrovastatin 5-10mg  daily, and atenolol 50 mg daily.  No prior hx of CAD or CVD.  Asymptomatic.  In need of renal function and lipid panel before making adjustments; she has needle phobia and agrees to come in for lab only visit within 2-3 weeks.

## 2023-08-28 NOTE — Assessment & Plan Note (Signed)
Overdue for lipid panel after initiating low dose statin for very high LDL.  Declined lab draw, agrees to come in for lab only visit.  Needle phobia.  Primary prevention.

## 2023-08-28 NOTE — Progress Notes (Signed)
88 y.o. Megan Robbins is here for routine follow-up of chronic conditions including HTN, Stage 3b CKD (last checked in 2022), hyperlipidemia (last checked very uncontrolled 2021), and was noted to have a low TSH at last visit ; this is her first f/u since then.  She also experiences frequent sinus conditions, and is followed by podiatry for plantar fasciitis.    Since last visit patient has seen podiatrist Dr. Logan Bores for chronic plantar faxciitis for decades; injections are painful but improvement lasts for "a pretty good while"; most recently before Thanksgiving,  f/u in 09/2023 for consideration of surgery to remove the underlying spurs.   Broke her 5th metatarsal last summer when slipped on wet oily asphalt.  Mentioned a slight nosebleed when blowing nose.  Reviewed medications; she took all this morning.  Feels great; no pain except for heels.  No incontinence, no constipation, eating well, pleased with how she's doing.  She declines to have blood work today - fear of needles.    Patient Active Problem List   Diagnosis Date Noted   Allergic rhinitis 10/09/2022   Goals of care, counseling/discussion 09/20/2022   Low TSH level 12/26/2021   Stage 3b chronic kidney disease (HCC) 12/26/2021   Anemia 12/26/2021   Goiter 12/26/2021   Hyperlipidemia 12/26/2021   Prediabetes 12/26/2021   Overweight 05/23/2015   Essential hypertension 05/08/2015    Current Outpatient Medications:    acetaminophen (TYLENOL) 325 MG tablet, Take 2 tablets (650 mg total) by mouth every 6 (six) hours as needed for moderate pain., Disp: 30 tablet, Rfl: 0   amLODipine-atorvastatin (CADUET) 5-10 MG tablet, Take 1 tablet by mouth daily., Disp: 30 tablet, Rfl: 11   atenolol (TENORMIN) 50 MG tablet, Take 1 tablet (50 mg total) by mouth daily. TAKE 1 TABLET(50 MG) BY MOUTH DAILY, Disp: 90 tablet, Rfl: 3   diclofenac Sodium (VOLTAREN) 1 % GEL, Apply 2 g topically 4 (four) times daily., Disp: 100 g, Rfl: 0    fluticasone (FLONASE) 50 MCG/ACT nasal spray, Place 1 spray into both nostrils daily., Disp: 16 g, Rfl: 2   hydrALAZINE (APRESOLINE) 25 MG tablet, TAKE 1 TABLET(25 MG) BY MOUTH TWICE DAILY, Disp: 180 tablet, Rfl: 0   sodium chloride (OCEAN) 0.65 % SOLN nasal spray, Place 1 spray into both nostrils as needed for congestion., Disp: 60 mL, Rfl: 0  Functional Status: Lives with her husband, independent cognitively and physically (with exception of heel pain which limits her walking).  Retired Financial risk analyst.  Watches her salt intake.    Objective BP (!) 155/75 (BP Location: Left Arm, Patient Position: Sitting, Cuff Size: Large)   Pulse 67   Temp 97.6 F (36.4 C) (Oral)   Ht 5\' 2"  (1.575 m)   Wt 164 lb 1.6 oz (74.4 kg)   SpO2 100%   BMI 30.01 kg/m   Exam: Well appearing woman with excellent mobility; able to stand from seated position with pushing off, brisk and balanced gait.  Neck with nontender stable anterior neck goiter.  No carotid bruits, no JVD.  Heart RRR with occasional irregularity, barely perceptible systolic murmur at base.  No LE edema.    Problems addressed this visit:  Essential hypertension Assessment & Plan: Not at goal: SBP 150s on hydralazine 25 mg bid, amlodipine-atrovastatin 5-10mg  daily, and atenolol 50 mg daily.  No prior hx of CAD or CVD.  Asymptomatic.  In need of renal function and lipid panel before making adjustments; she has needle phobia and agrees to come in  for lab only visit within 2-3 weeks.     Stage 3b chronic kidney disease (HCC) Assessment & Plan: Last renal function assessed in 05/2021 - I didn't realize that at last visit she also declined blood draw.  If she doesn't come in for lab only visit as she promised she would do (declined today), I will call her back in.     Elevated lipoprotein(a) Assessment & Plan: Overdue for lipid panel after initiating low dose statin for very high LDL.  Declined lab draw, agrees to come in for lab only visit.  Needle phobia.   Primary prevention.     Low TSH level Assessment & Plan: High TSH at last visit, though no symptoms or signs of hyperthyroidism.  She did not return for recheck and declines bloodwork today.  Agrees to lab only visit in next couple weeks.    Return in about 3 months (around 11/26/2023) for chronic condition monitoring, med review.

## 2023-08-28 NOTE — Assessment & Plan Note (Signed)
High TSH at last visit, though no symptoms or signs of hyperthyroidism.  She did not return for recheck and declines bloodwork today.  Agrees to lab only visit in next couple weeks.

## 2023-09-13 ENCOUNTER — Other Ambulatory Visit: Payer: Self-pay | Admitting: Internal Medicine

## 2023-09-13 DIAGNOSIS — R7989 Other specified abnormal findings of blood chemistry: Secondary | ICD-10-CM

## 2023-09-13 DIAGNOSIS — D649 Anemia, unspecified: Secondary | ICD-10-CM

## 2023-09-13 DIAGNOSIS — N1832 Chronic kidney disease, stage 3b: Secondary | ICD-10-CM

## 2023-09-13 DIAGNOSIS — E785 Hyperlipidemia, unspecified: Secondary | ICD-10-CM

## 2023-09-13 DIAGNOSIS — I10 Essential (primary) hypertension: Secondary | ICD-10-CM

## 2023-09-14 ENCOUNTER — Ambulatory Visit (INDEPENDENT_AMBULATORY_CARE_PROVIDER_SITE_OTHER): Payer: Medicare Other | Admitting: Podiatry

## 2023-09-14 ENCOUNTER — Ambulatory Visit: Payer: Medicare Other

## 2023-09-14 ENCOUNTER — Encounter: Payer: Self-pay | Admitting: Podiatry

## 2023-09-14 DIAGNOSIS — M722 Plantar fascial fibromatosis: Secondary | ICD-10-CM | POA: Diagnosis not present

## 2023-09-14 DIAGNOSIS — M79674 Pain in right toe(s): Secondary | ICD-10-CM | POA: Diagnosis not present

## 2023-09-14 DIAGNOSIS — M79675 Pain in left toe(s): Secondary | ICD-10-CM | POA: Diagnosis not present

## 2023-09-14 DIAGNOSIS — B351 Tinea unguium: Secondary | ICD-10-CM

## 2023-09-14 MED ORDER — BETAMETHASONE SOD PHOS & ACET 6 (3-3) MG/ML IJ SUSP
3.0000 mg | Freq: Once | INTRAMUSCULAR | Status: AC
  Administered 2023-09-14: 3 mg via INTRA_ARTICULAR

## 2023-09-14 NOTE — Telephone Encounter (Signed)
  hydrALAZINE  (APRESOLINE ) 25 MG tablet  [id number] - [place], [place] - [address]

## 2023-09-14 NOTE — Progress Notes (Signed)
   Chief Complaint  Patient presents with   Plantar Fasciitis    Patient states she is having bilateral heel pain.    HPI: 88 y.o. female presenting today for follow-up evaluation of chronic plantar fasciitis to the bilateral feet.  Ongoing for several years.  She comes in routinely for cortisone injections when pain increases.  We have scheduled surgery in the past but she continues to x-ray the surgery.  She is continued about having surgery in January 2025 to correct for the chronic plantar fasciitis  Patient also requesting nail debridement today.  Her nails are elongated and thickened and tender 1-5 bilateral   Past Medical History:  Diagnosis Date   Acute urticaria 12/26/2021   Anxiety    Bronchitis due to COVID-19 virus 03/11/2021   Compression fracture of L3 vertebra (HCC) 05/13/2017   Dysuria 06/14/2021   Eustachian tube dysfunction, right 04/01/2018   Function kidney decreased 05/13/2017   Hypertension    Impacted cerumen of left ear 04/01/2018   Low back strain, initial encounter 02/22/2021   Mid back pain 05/08/2017   Rash 06/23/2019   Seasonal allergic rhinitis 04/01/2018   Stress and adjustment reaction 05/08/2017    Past Surgical History:  Procedure Laterality Date   ABDOMINAL HYSTERECTOMY     APPENDECTOMY     FOOT SURGERY     HEEL SPUR SURGERY     TONSILLECTOMY      No Known Allergies   Physical Exam: General: The patient is alert and oriented x3 in no acute distress.  Dermatology: Skin is warm, dry and supple bilateral lower extremities.  Nails are elongated and thickened 1-5 bilateral with associated tenderness  Vascular: Palpable pedal pulses bilaterally. Capillary refill within normal limits.  No edema.  No erythema.  Neurological: Grossly intact via light touch  Musculoskeletal Exam: No gross pedal deformity.  There is pain with palpation to the plantar heels bilateral  Radiographic exam B/L feet 03/16/2023 Chronic degenerative changes noted  throughout the pedal joints of the foot.  Mild osteopenia also noted.  No acute fractures identified.  Plantar heel spur noted on lateral view  Assessment/Plan of Care: 1.  Chronic plantar fasciitis bilateral 2.  Plantar heel spurs bilateral 3.  Pain due to onychomycosis of toenails both  -Patient evaluated.   -Injection of 0.5 cc Celestone  Soluspan injected in bilateral plantar fascia -Again today we did discuss the possibility of surgery to correct for the chronic plantar fasciitis.  She has been seeing me for about 8 years and surgeries been scheduled multiple times but canceled.  The patient is thinking of having surgery in January 2025 -Mechanical debridement of nails 1-5 bilateral was performed using a nail nipper without incident or bleeding.  Smoothed with a rotary bur -Return to clinic as needed        Dot Gazella, DPM [facility]  Dr. Dot Gazella, DPM    [address] [place], [place] [address]                Office [phone]  Fax [phone]

## 2023-09-14 NOTE — Telephone Encounter (Signed)
 Medication sent to pharmacy

## 2023-09-17 ENCOUNTER — Other Ambulatory Visit: Payer: Self-pay

## 2023-09-17 MED ORDER — HYDRALAZINE HCL 25 MG PO TABS: ORAL_TABLET | ORAL | 0 refills | Status: DC

## 2023-09-25 ENCOUNTER — Ambulatory Visit: Payer: Medicare Other | Admitting: Student

## 2023-09-25 VITALS — BP 156/72 | HR 70 | Temp 98.2°F | Ht 62.0 in | Wt 162.0 lb

## 2023-09-25 DIAGNOSIS — G8929 Other chronic pain: Secondary | ICD-10-CM | POA: Diagnosis not present

## 2023-09-25 DIAGNOSIS — M25562 Pain in left knee: Secondary | ICD-10-CM

## 2023-09-25 NOTE — Progress Notes (Signed)
   CC: Acute Concern of left knee pain  HPI:  Megan Robbins is a 88 y.o. female with pertinent PMH of HTN, CKD stage IIIb, prediabetes, and HLD who presents as above. Please see assessment and plan below for further details.  Review of Systems:   Pertinent items noted in HPI and/or A&P.  Physical Exam:  Vitals:   09/25/23 0835 09/25/23 0903  BP: (!) 157/71 (!) 156/72  Pulse: 70 70  Temp: 98.2 F (36.8 C)   TempSrc: Oral   SpO2: 100%   Weight: 162 lb (73.5 kg)   Height: 5\' 2"  (1.575 m)     Constitutional: Anxious but well-appearing elderly female. In no acute distress. Pulm: Normal work of breathing on room air. MSK: Left knee exam with mild tenderness to palpation of the lateral joint line and posterior joint without effusion or erythema.  ACL, PCL, LCL, MCL, and meniscal testing without abnormalities.  Left knee strength normal.  Normal gait with no varus or valgus deformity at the knees. Skin:Warm and dry. Neuro:Alert and oriented x3. No focal deficit noted. Psych:Pleasant mood and affect.   Assessment & Plan:   Chronic pain of left knee Patient comes in with concerns of 2 months of left knee pain with some intermittent instability without falls, effusion, redness, or known mechanism of injury.  On exam there is mild tenderness to palpation of the lateral joint line and posterior joint without effusion, local edema, or redness.  Stability testing including ACL, PCL, MCL, LCL, and meniscal testing were all normal with no varus or valgus deformity.  Strength testing also normal.  Gait normal.  She does not use a cane at home but has one.  Due to her having generalized knee pain without a mechanism of injury and no signs of effusion or ligamentous/meniscal pathology this is most likely osteoarthritic pain.  She does not have any previous knee imaging.  For now we will start with conservative measures. - Voltaren gel as needed, ice/heat, weightbearing exercise as tolerated with a  cane handy if needed, and Tylenol 1000 mg every 8 hours as needed - Return precautions discussed    Patient discussed with Dr. Kris Mouton, DO [facility] Internal [facility] Resident PGY-2 Clinic Phone: [phone] Pager: [phone]

## 2023-09-25 NOTE — Assessment & Plan Note (Addendum)
 Patient comes in with concerns of 2 months of left knee pain with some intermittent instability without falls, effusion, redness, or known mechanism of injury.  On exam there is mild tenderness to palpation of the lateral joint line and posterior joint without effusion, local edema, or redness.  Stability testing including ACL, PCL, MCL, LCL, and meniscal testing were all normal with no varus or valgus deformity.  Strength testing also normal.  Gait normal.  She does not use a cane at home but has one.  Due to her having generalized knee pain without a mechanism of injury and no signs of effusion or ligamentous/meniscal pathology this is most likely osteoarthritic pain.  She does not have any previous knee imaging.  For now we will start with conservative measures. - Voltaren gel as needed, ice/heat, weightbearing exercise as tolerated with a cane handy if needed, and Tylenol 1000 mg every 8 hours as needed - Return precautions discussed

## 2023-09-25 NOTE — Patient Instructions (Addendum)
  Thank you, [name], for allowing [place] to provide your care today.  I think your knee pain is most likely due to osteoarthritis which occurs due to chronic wear and tear on your joints.  This most commonly affects the knees and hips.  We will try conservative measures to help with your knee pain and if these do not work or it is getting worse we will have you back to reevaluate.  Please continue to be active as this can help improve osteoarthritis.  You can use topical Voltaren (diclofenac) gel 4 times a day as needed, ice or heat whichever helps more, and Tylenol 1000 mg every 8 hours as needed.  If this does get any worse or you start to have any swelling in her knee, redness in your knee or worsening weakness please give [place] a call.   Follow up: 2 months    Remember:     Should you have any questions or concerns please call the internal medicine clinic at [phone].     Rocky Morel, DO [facility]

## 2023-09-28 NOTE — Progress Notes (Signed)
 Internal Medicine Clinic Attending  Case discussed with the resident at the time of the visit.  We reviewed the resident's history and exam and pertinent patient test results.  I agree with the assessment, diagnosis, and plan of care documented in the resident's note.

## 2023-09-30 ENCOUNTER — Telehealth: Payer: Self-pay | Admitting: Student

## 2023-09-30 NOTE — Telephone Encounter (Addendum)
 Called the patient after she requested a call regarding L leg pain.   Patient was at home with husband, and has been monitoring her L knee after it was initially examined it at [facility] last Friday. Since, her knee has been doing better with supportive treatment. Yesterday, she started doing more exercises in an attempt to rehab it.   Today, patient woke up with L ankle pain. No pain on ambulation, but it "felt tender". NO redness, significant swelling, or rash overlying the ankle joint. Denied pain with ROM exercises. Denies pain in calf or changes in temperature.   Patient also mentioned she was anxious that something was wrong with her and wanted reassurance. Almost presented to the ED.  Suspect arthritis vs sprain in her L ankle after recent return to activity. Reviewed patient's [facility] exam with her, and given significant improvement, reassured her. Reviewed signs or symptoms that would prompt her to be physically evaluated. Plan: - Supportive management with Tylenol 1000 mg q8HR prn for 7 days - Voltaren gel - ice, rest, and use walking aid while L knee and ankle continue to improve

## 2023-10-05 ENCOUNTER — Telehealth: Payer: Self-pay | Admitting: Podiatry

## 2023-10-05 ENCOUNTER — Telehealth: Payer: Self-pay | Admitting: *Deleted

## 2023-10-05 NOTE — Telephone Encounter (Signed)
 Call from patient at last appointment was given and told to take Tylenol and to use Voltaren Gel for her knee and ankle pain.  Pain has since moved to her heel.  After swelling in the ankle for 2 days.  Has bone spurs in he foot that she may need surgery for.  Saw her foot doctor recently and was informed about the Bone spurs.  Will call the Foot Doctor as well and would like to know if something can be called in for her until she sees the foot doctor or what else she can do. Stated also has pain in the ankle when she moves it sometimes.

## 2023-10-05 NOTE — Telephone Encounter (Signed)
 Pt called and is having pain in the heel and ankle and is wanting to speak to the doctor about it, the pcp told pt to call to see if we could give pt something for the heel pain. She is on tylenol and voltaren gel for the knee and ankle pain per note from pcp. I did move her appt to 3/10 from 3/24.  She would still like a call back to see if anything could be sent in for the pain.

## 2023-10-12 ENCOUNTER — Encounter: Payer: Self-pay | Admitting: Podiatry

## 2023-10-12 ENCOUNTER — Ambulatory Visit (INDEPENDENT_AMBULATORY_CARE_PROVIDER_SITE_OTHER): Admitting: Podiatry

## 2023-10-12 DIAGNOSIS — M5416 Radiculopathy, lumbar region: Secondary | ICD-10-CM

## 2023-10-12 NOTE — Progress Notes (Signed)
 Chief Complaint  Patient presents with   Foot Pain    Patient states that her left foot is in a lot of pain     HPI: 88 y.o. female presenting today for evaluation of left lower extremity numbness and 'heavy' sensation.  Patient states that about 3-4 weeks ago she began to experience lower back pain which traveled down to her knee and ankle.  She says that now intermittently her foot will fall asleep and she has to move it to wake it up.  She does have a long history of lower back pain and when she was caring for her sister who is now deceased she did a lot of lifting with her lower back and according to the patient she 'blew out her back' while caring for her sister.  She presents for further treatment evaluation   Past Medical History:  Diagnosis Date   Acute urticaria 12/26/2021   Anxiety    Bronchitis due to COVID-19 virus 03/11/2021   Compression fracture of L3 vertebra (HCC) 05/13/2017   Dysuria 06/14/2021   Eustachian tube dysfunction, right 04/01/2018   Function kidney decreased 05/13/2017   Hypertension    Impacted cerumen of left ear 04/01/2018   Low back strain, initial encounter 02/22/2021   Mid back pain 05/08/2017   Rash 06/23/2019   Seasonal allergic rhinitis 04/01/2018   Stress and adjustment reaction 05/08/2017    Past Surgical History:  Procedure Laterality Date   ABDOMINAL HYSTERECTOMY     APPENDECTOMY     FOOT SURGERY     HEEL SPUR SURGERY     TONSILLECTOMY      No Known Allergies   Physical Exam: General: The patient is alert and oriented x3 in no acute distress.  Dermatology: Skin is warm, dry and supple bilateral lower extremities.  Nails are elongated and thickened 1-5 bilateral with associated tenderness  Vascular: Palpable pedal pulses bilaterally. Capillary refill within normal limits.  No edema.  No erythema.  Neurological: Grossly intact via light touch  Musculoskeletal Exam: No gross pedal deformity.  Chronic pain with palpation to the  plantar heels bilateral  Radiographic exam B/L feet 03/16/2023 Chronic degenerative changes noted throughout the pedal joints of the foot.  Mild osteopenia also noted.  No acute fractures identified.  Plantar heel spur noted on lateral view  Assessment/Plan of Care: 1.  Chronic plantar fasciitis bilateral 2.  Plantar heel spurs bilateral 3.  History of lower back pain with chronic lumbar radiculopathy  -Patient evaluated.   - After long discussion with the patient she does have a history of lower back pain.  Patient states that the pain to her left lower extremity began in her lower back and eventually traveled down to her knee and ankle.  She she complains that the foot intermittently falls asleep.  I do believe this is back related. -Recommend follow-up with PCP and possible referral to neuro spine for lumbar radiculopathy -Return to clinic with me as needed        Felecia Shelling, DPM [facility]  Dr. Felecia Shelling, DPM    [address], [place] [address]                Office [phone]  Fax [phone]

## 2023-10-12 NOTE — Telephone Encounter (Signed)
 Copied from CRM [id number]. Topic: Appointments - Scheduling Inquiry for Clinic >> Oct 12, 2023 12:14 PM Prudencio Pair wrote: Reason for CRM: Patient states she went to a podiatrist this morning & they told her that they think her issue is coming from a nerve in her foot. Patient states she was told to follow up with her pcp or either be referred to a doctor for her back. Please reach out to patient to get her schedule to follow up with her provider. CB #: [id number].  Called the pt. Appt has been sch for the following:  Name: Robbins, Megan MRN: [id number]  Date: 10/15/2023 Status: Sch  Time: 9:45 AM Length: 30  Visit Type: OPEN ESTABLISHED [726] Copay: $0.00  Provider: Katheran James, DO

## 2023-10-15 ENCOUNTER — Ambulatory Visit: Admitting: Student

## 2023-10-15 ENCOUNTER — Encounter: Payer: Self-pay | Admitting: Student

## 2023-10-15 VITALS — BP 181/88 | HR 63 | Ht 63.0 in | Wt 163.3 lb

## 2023-10-15 DIAGNOSIS — M25562 Pain in left knee: Secondary | ICD-10-CM | POA: Diagnosis not present

## 2023-10-15 DIAGNOSIS — R7303 Prediabetes: Secondary | ICD-10-CM

## 2023-10-15 DIAGNOSIS — F40298 Other specified phobia: Secondary | ICD-10-CM

## 2023-10-15 DIAGNOSIS — E7841 Elevated Lipoprotein(a): Secondary | ICD-10-CM

## 2023-10-15 DIAGNOSIS — N1832 Chronic kidney disease, stage 3b: Secondary | ICD-10-CM

## 2023-10-15 DIAGNOSIS — I10 Essential (primary) hypertension: Secondary | ICD-10-CM | POA: Diagnosis not present

## 2023-10-15 DIAGNOSIS — G8929 Other chronic pain: Secondary | ICD-10-CM

## 2023-10-15 NOTE — Assessment & Plan Note (Signed)
 She is here to discuss her L leg. She has discussed this before with other doctors in the office. She has chronic mild-mod pain in the L knee and L ankle as well as intermittent numbness of the L calf. All of this comes and goes and are overall well-controlled with conservative measures. She also has occasional dependent edema without any dyspnea and swelling always goes away with rest/elevation, none appreciated today. Her main concern is if she has a nerve issue and she is very worried if something may be wrong. She is able to walk, is not falling, has intact strength and reflexes. I believe her issue is primarily OA possibly with some nerve entrapment. However she does not have alarm symptoms, consistent pain, ambulatory dysfunction, or sciatic pain. She is very relieved by today's assessment and agrees with continuing conservative treatment. - Continue tylenol and voltaren prn - Ref to physical therapy

## 2023-10-15 NOTE — Assessment & Plan Note (Addendum)
 BP very high today. 181/88 -> 186/86 on recheck. She is taking her medicines. She reports that she always gets very nervous when she comes to doctors. Suspect white-coat syndrome. Home measurements are 130s/70s. Will nevertheless give her a BP log to take at home. - hydralazine 25 every day - atenolol 50 every day  - Amlodipine-atorvastatin 5-10 every day - BP log - RTC one month to review log and for labs

## 2023-10-15 NOTE — Progress Notes (Signed)
   CC: L leg pain  HPI:  Megan Robbins is a 88 y.o. female living with a history stated below and presents today for leg pain. Please see problem based assessment and plan for additional details.  Past Medical History:  Diagnosis Date   Acute urticaria 12/26/2021   Anxiety    Bronchitis due to COVID-19 virus 03/11/2021   Compression fracture of L3 vertebra (HCC) 05/13/2017   Dysuria 06/14/2021   Eustachian tube dysfunction, right 04/01/2018   Function kidney decreased 05/13/2017   Hypertension    Impacted cerumen of left ear 04/01/2018   Low back strain, initial encounter 02/22/2021   Mid back pain 05/08/2017   Rash 06/23/2019   Seasonal allergic rhinitis 04/01/2018   Stress and adjustment reaction 05/08/2017    Review of Systems: ROS negative except for what is noted on the assessment and plan.  Vitals:   10/15/23 0945  BP: (!) 181/88  Pulse: 63  TempSrc: Oral  SpO2: 100%  Weight: 163 lb 4.8 oz (74.1 kg)  Height: 5\' 3"  (1.6 m)    Physical Exam: Constitutional: well-appearing woman in no acute distress Cardiovascular: regular rate and rhythm, no m/r/g Pulmonary/Chest: normal work of breathing on room air, lungs clear to auscultation bilaterally Abdominal: soft, non-tender, non-distended MSK: normal bulk and tone. Mild TTP of L knee and ankle without swelling/edema, erythema, effusion, evidence of acute injury. Sensation fully nitact. No skin changes. Neurological: alert & oriented x 3, no focal deficit Skin: warm and dry Psych: normal mood and behavior  Assessment & Plan:   Patient discussed with Dr. Antony Contras  Chronic pain of left knee She is here to discuss her L leg. She has discussed this before with other doctors in the office. She has chronic mild-mod pain in the L knee and L ankle as well as intermittent numbness of the L calf. All of this comes and goes and are overall well-controlled with conservative measures. She also has occasional dependent edema  without any dyspnea and swelling always goes away with rest/elevation, none appreciated today. Her main concern is if she has a nerve issue and she is very worried if something may be wrong. She is able to walk, is not falling, has intact strength and reflexes. I believe her issue is primarily OA possibly with some nerve entrapment. However she does not have alarm symptoms, consistent pain, ambulatory dysfunction, or sciatic pain. She is very relieved by today's assessment and agrees with continuing conservative treatment. - Continue tylenol and voltaren prn - Ref to physical therapy  Essential hypertension BP very high today. 181/88 -> 186/86 on recheck. She is taking her medicines. She reports that she always gets very nervous when she comes to doctors. Suspect white-coat syndrome. Home measurements are 130s/70s. Will nevertheless give her a BP log to take at home. - hydralazine 25 every day - atenolol 50 every day  - Amlodipine-atorvastatin 5-10 every day - BP log - RTC one month to review log and for labs  Needle phobia She has not had labs in two years due to needle phobia. She states she is willing to come back for labs but is not prepared for it today. I have ordered future lipids, A1c, BMP.  RTC 1 month for BP check and labs  Katheran James, D.O. [facility], PGY-1 Phone: [phone] Date 10/15/2023 Time 4:46 PM

## 2023-10-15 NOTE — Patient Instructions (Signed)
 Megan Robbins,  Please rest assured! Your leg troubles are not dangerous to you. You have some arthritis and mild nerve entrapment which causes numbness, but these are not harmful. Continue to take tylenol as needed for pain. Put your legs up for swelling. Use voltaren gel or head pads too.  The only thing I don't want to happen is for you to fall. Take breaks when you walk and use a cane. I will send you to physical therapy to work on strength.  Please consider coming back in one month to review your blood pressure log. I would love to check your labwork at that time too.  Best, Dr Ninfa Meeker

## 2023-10-15 NOTE — Assessment & Plan Note (Signed)
 She has not had labs in two years due to needle phobia. She states she is willing to come back for labs but is not prepared for it today. I have ordered future lipids, A1c, BMP.

## 2023-10-22 NOTE — Progress Notes (Signed)
 Internal Medicine Clinic Attending  Case discussed with the resident at the time of the visit.  We reviewed the resident's history and exam and pertinent patient test results.  I agree with the assessment, diagnosis, and plan of care documented in the resident's note.

## 2023-10-26 ENCOUNTER — Ambulatory Visit: Admitting: Podiatry

## 2023-10-28 NOTE — Therapy (Signed)
 OUTPATIENT PHYSICAL THERAPY LOWER EXTREMITY EVALUATION   Patient Name: Megan Robbins MRN: [id number] DOB:1936/01/15, 88 y.o., female Today's Date: 10/29/2023  END OF SESSION:  PT End of Session - 10/29/23 0830     Visit Number 1    Number of Visits 7    Date for PT Re-Evaluation 12/10/23    PT Start Time 0825    PT Stop Time 0906    PT Time Calculation (min) 41 min    Activity Tolerance Patient tolerated treatment well    Behavior During Therapy [facility] for tasks assessed/performed             Past Medical History:  Diagnosis Date   Acute urticaria 12/26/2021   Anxiety    Bronchitis due to COVID-19 virus 03/11/2021   Compression fracture of L3 vertebra (HCC) 05/13/2017   Dysuria 06/14/2021   Eustachian tube dysfunction, right 04/01/2018   Function kidney decreased 05/13/2017   Hypertension    Impacted cerumen of left ear 04/01/2018   Low back strain, initial encounter 02/22/2021   Mid back pain 05/08/2017   Rash 06/23/2019   Seasonal allergic rhinitis 04/01/2018   Stress and adjustment reaction 05/08/2017   Past Surgical History:  Procedure Laterality Date   ABDOMINAL HYSTERECTOMY     APPENDECTOMY     FOOT SURGERY     HEEL SPUR SURGERY     TONSILLECTOMY     Patient Active Problem List   Diagnosis Date Noted   Needle phobia 10/15/2023   Chronic pain of left knee 09/25/2023   Allergic rhinitis 10/09/2022   Goals of care, counseling/discussion 09/20/2022   Low TSH level 12/26/2021   Stage 3b chronic kidney disease (HCC) 12/26/2021   Anemia 12/26/2021   Goiter 12/26/2021   Hyperlipidemia 12/26/2021   Prediabetes 12/26/2021   Overweight 05/23/2015   Essential hypertension 05/08/2015    PCP: Gwenevere Abbot, MD   REFERRING PROVIDER: Reymundo Poll, MD  REFERRING DIAG: [phone] (ICD-10-CM) - Chronic pain of left knee  Rationale for Evaluation and Treatment: Rehabilitation  THERAPY DIAG:  Pain in left ankle and joints of left foot  Other  abnormalities of gait and mobility  PERTINENT HISTORY: HTN, CKD3, needle phobia  WEIGHT BEARING RESTRICTIONS: No  FALLS:  Has patient fallen in last 6 months? Yes. Number of falls 1, broke her  LIVING ENVIRONMENT: Lives with: lives with their family Lives in: House/apartment Stairs: No Has following equipment at home: None  OCCUPATION: retired   PRECAUTIONS: None ---------------------------------------------------------------------------------------------  SUBJECTIVE:   SUBJECTIVE STATEMENT: Eval statement 10/29/2023:  Pt  used to have complaints of pain in the L knee, But now that has subsided, currently experiencing some swelling in the ankle alongside the ankle giving out while walking occasionally. Visited podiatry who mentioned she was having nerve impingement. Occasionally has a small pinch in the front of the ankle that gets up to a 5/10 pain.  RED FLAGS: None   PLOF: Independent  PATIENT GOALS: stop the ankle from giving out.  NEXT MD VISIT: need to schedule ---------------------------------------------------------------------------------------------  OBJECTIVE:  Note: Objective measures were completed at Evaluation unless otherwise noted.  DIAGNOSTIC FINDINGS: Ordered for L foot.  PATIENT SURVEYS:  LEFS 4/80  COGNITION: Overall cognitive status: Within functional limits for tasks assessed     SENSATION: WFL  EDEMA:  No swelling noted  MUSCLE LENGTH: Hamstrings: Right 10 deg; Left 10 deg  POSTURE: forward head  PALPATION: No tenderness  LOWER EXTREMITY ROM:  Active ROM Right eval Left eval  Hip  flexion    Hip extension    Hip abduction    Hip adduction    Hip internal rotation    Hip external rotation    Knee flexion    Knee extension    Ankle dorsiflexion WFL 12  Ankle plantarflexion [facility] WFL  Ankle inversion [facility]  Ankle eversion WFL WFL   (Blank rows = not tested)  ! Indicates pain with testing  LOWER EXTREMITY MMT:  MMT  Right eval Left eval  Hip flexion    Hip extension    Hip abduction    Hip adduction    Hip internal rotation    Hip external rotation    Knee flexion    Knee extension    Ankle dorsiflexion 5 4  Ankle plantarflexion 5 4  Ankle inversion 5 4-  Ankle eversion     (Blank rows = not tested)  ! Indicates pain with testing  LOWER EXTREMITY SPECIAL TESTS:  Ankle special tests: Anterior drawer test: negative   Slump test: negative GAIT: Distance walked: 156ft Assistive device utilized: None Level of assistance: Complete Independence Comments: slow cadence                                                                                                                                [facility] Adult PT Treatment:                                                DATE: 10/29/2023   Self Care: Pt education, detailed below POC discussion    PATIENT EDUCATION:  Education details: Pt received education regarding HEP performance, ADL performance, functional activity tolerance, impairment education, appropriate performance of therapeutic activities. Person educated: Patient Education method: Explanation, Demonstration, Tactile cues, Verbal cues, and Handouts Education comprehension: verbalized understanding and returned demonstration  HOME EXERCISE PROGRAM: Access Code: [email or web address] URL: [email or web address] Date: 10/29/2023 Prepared by: Sheliah Plane  Exercises - Seated Ankle Inversion with Resistance and Legs Crossed  - 1 x daily - 4-7 x weekly - 3 sets - 10 reps - Long Sitting Calf Stretch with Strap  - 1 x daily - 7 x weekly - 2-3 sets - 1 reps - 87m hold - Heel Raises with Counter Support  - 1 x daily - 7 x weekly - 3 sets - 10 reps - Heel Toe Raises with Counter Support  - 1 x daily - 4-7 x weekly - 3 sets - 10 reps - Standing Hip Abduction with Counter Support  - 1 x daily - 4-7 x weekly - 3 sets - 10  reps ---------------------------------------------------------------------------------------------  ASSESSMENT:  CLINICAL IMPRESSION: Eval impression (10/29/2023): Pt. attended today's physical therapy session for evaluation of L knee pain. Pt has complaints of L ankle giving out during prolonged walking and minimal swelling,. Pt has notable deficits  with ankle stability, strength, and gastroc motility  Signs and symptoms are concurrent with ankle instability. Pt would benefit from therapeutic focus on ankle stability in weight bearing, global ankle strengthening, and gastroc motility.  Treatment performed today focused on pt education detailed in obj Pt demonstrated great understanding of education provided. required minimal verbal/tactile cues and no physical assistance for appropriate performance with today's activities. Pt requires the intervention of skilled outpatient physical therapy to address the aforementioned deficits and progress towards a functional level in line with therapeutic goals.   OBJECTIVE IMPAIRMENTS: difficulty walking, decreased strength, impaired flexibility, and pain.   ACTIVITY LIMITATIONS: locomotion level, Stairs  PARTICIPATION LIMITATIONS: shopping, community activity, and yard work  PERSONAL FACTORS: Age, Fitness, and Time since onset of injury/illness/exacerbation are also affecting patient's functional outcome.   REHAB POTENTIAL: Good  CLINICAL DECISION MAKING: Stable/uncomplicated  EVALUATION COMPLEXITY: Low   GOALS: Goals reviewed with patient? YES  SHORT TERM GOALS: Target date: 11/19/2023 Pt will be independent with administered HEP to demonstrate the competency necessary for long term managemnet of symptoms at home.  Baseline: Goal status: INITIAL    LONG TERM GOALS: Target date: 12/10/2023  Pt. Will achieve a LEFS score of less than 4 as to demonstrate improvement in self-perceived functional ability with daily activities.  Baseline:  4/80 Goal status: INITIAL  2.  Pt will improve Global ankle strength to a 5/5 to demonstrate improvement in strength for quality of motion and activity performance.  Baseline: see obj chart Goal status: INITIAL  3.   Pt will independently ambulate 600 with no AD and no reports of ankle giving out to demonstrate improved weightbearing tolerance, BLE strength, and functional capacity for community ambulation.  Baseline: 314ft, ankle gives out Goal status: INITIAL      --------------------------------------------------------------------------------------------- PLAN:  PT FREQUENCY: 1x/week pt requested 1 time per week  PT DURATION: 6 weeks  PLANNED INTERVENTIONS: 97110-Therapeutic exercises, 97530- Therapeutic activity, 97112- Neuromuscular re-education, 97535- Self Care, 78295- Manual therapy, [phone]- Gait training, [phone]- Aquatic Therapy, Stair training, Taping, and Joint mobilization  PLAN FOR NEXT SESSION: Review HEP, Begin POC as detailed in the assessment   Sheliah Plane, PT, DPT 10/29/2023, 9:18 AM    Date of referral: 10/15/2023 Referring provider: Reymundo Poll, MD Referring diagnosis? Chronis L knee pain Treatment diagnosis? (if different than referring diagnosis) L ankle pain, gait abnormalities  What was this (referring dx) caused by? Ongoing Issue  Ashby Dawes of Condition: Chronic (continuous duration > 3 months)   Laterality: Lt  Current Functional Measure Score: LEFS 4/80  Objective measurements identify impairments when they are compared to normal values, the uninvolved extremity, and prior level of function.  [x]  Yes  []  No  Objective assessment of functional ability: Minimal functional limitations   Briefly describe symptoms: ankle gives out when walking  How did symptoms start: no moi  Average pain intensity:  Last 24 hours: 0/10  Past week: 5/10  How often does the pt experience symptoms? Occasionally  How much have the symptoms interfered  with usual daily activities? A little bit  How has condition changed since care began at this facility? NA - initial visit  In general, how is the patients overall health? Good   BACK PAIN (STarT Back Screening Tool) No

## 2023-10-29 ENCOUNTER — Other Ambulatory Visit: Payer: Self-pay

## 2023-10-29 ENCOUNTER — Ambulatory Visit: Attending: Internal Medicine | Admitting: Physical Therapy

## 2023-10-29 ENCOUNTER — Encounter: Payer: Self-pay | Admitting: Physical Therapy

## 2023-10-29 DIAGNOSIS — M25572 Pain in left ankle and joints of left foot: Secondary | ICD-10-CM | POA: Diagnosis not present

## 2023-10-29 DIAGNOSIS — G8929 Other chronic pain: Secondary | ICD-10-CM | POA: Diagnosis not present

## 2023-10-29 DIAGNOSIS — R2689 Other abnormalities of gait and mobility: Secondary | ICD-10-CM | POA: Insufficient documentation

## 2023-10-29 DIAGNOSIS — M25562 Pain in left knee: Secondary | ICD-10-CM | POA: Insufficient documentation

## 2023-11-02 ENCOUNTER — Ambulatory Visit: Admitting: Physical Therapy

## 2023-11-02 ENCOUNTER — Encounter: Payer: Self-pay | Admitting: Physical Therapy

## 2023-11-02 DIAGNOSIS — G8929 Other chronic pain: Secondary | ICD-10-CM | POA: Diagnosis not present

## 2023-11-02 DIAGNOSIS — M25572 Pain in left ankle and joints of left foot: Secondary | ICD-10-CM | POA: Diagnosis not present

## 2023-11-02 DIAGNOSIS — R2689 Other abnormalities of gait and mobility: Secondary | ICD-10-CM

## 2023-11-02 DIAGNOSIS — M25562 Pain in left knee: Secondary | ICD-10-CM | POA: Diagnosis not present

## 2023-11-02 NOTE — Therapy (Signed)
 OUTPATIENT PHYSICAL THERAPY LOWER EXTREMITY EVALUATION   Patient Name: Megan Robbins MRN: [id number] DOB:[birth year], 88 y.o., female Today's Date: 11/02/2023  END OF SESSION:  PT End of Session - 11/02/23 1041     Visit Number 2    Number of Visits 7    Date for PT Re-Evaluation 12/10/23    PT Start Time 1042    PT Stop Time 1120    PT Time Calculation (min) 38 min    Activity Tolerance Patient tolerated treatment well    Behavior During Therapy [facility] for tasks assessed/performed              Past Medical History:  Diagnosis Date   Acute urticaria 12/26/2021   Anxiety    Bronchitis due to COVID-19 virus 03/11/2021   Compression fracture of L3 vertebra (HCC) 05/13/2017   Dysuria 06/14/2021   Eustachian tube dysfunction, right 04/01/2018   Function kidney decreased 05/13/2017   Hypertension    Impacted cerumen of left ear 04/01/2018   Low back strain, initial encounter 02/22/2021   Mid back pain 05/08/2017   Rash 06/23/2019   Seasonal allergic rhinitis 04/01/2018   Stress and adjustment reaction 05/08/2017   Past Surgical History:  Procedure Laterality Date   ABDOMINAL HYSTERECTOMY     APPENDECTOMY     FOOT SURGERY     HEEL SPUR SURGERY     TONSILLECTOMY     Patient Active Problem List   Diagnosis Date Noted   Needle phobia 10/15/2023   Chronic pain of left knee 09/25/2023   Allergic rhinitis 10/09/2022   Goals of care, counseling/discussion 09/20/2022   Low TSH level 12/26/2021   Stage 3b chronic kidney disease (HCC) 12/26/2021   Anemia 12/26/2021   Goiter 12/26/2021   Hyperlipidemia 12/26/2021   Prediabetes 12/26/2021   Overweight 05/23/2015   Essential hypertension 05/08/2015    PCP: Gwenevere Abbot, MD   REFERRING PROVIDER: Reymundo Poll, MD  REFERRING DIAG: [phone] (ICD-10-CM) - Chronic pain of left knee  Rationale for Evaluation and Treatment: Rehabilitation  THERAPY DIAG:  Pain in left ankle and joints of left foot  Other  abnormalities of gait and mobility  PERTINENT HISTORY: HTN, CKD3, needle phobia  WEIGHT BEARING RESTRICTIONS: No  FALLS:  Has patient fallen in last 6 months? Yes. Number of falls 1  LIVING ENVIRONMENT: Lives with: lives with their family Lives in: House/apartment Stairs: No Has following equipment at home: None  OCCUPATION: retired   PRECAUTIONS: None ---------------------------------------------------------------------------------------------  SUBJECTIVE:   SUBJECTIVE STATEMENT: Pt stated that she has had no ankle giving out episodes since last visit, has been compliant with HEP and feels that it is already helping. Stated that she has lied to her family about previous falls/ LOB episodes.  Eval statement 10/29/2023:  Pt  used to have complaints of pain in the L knee, But now that has subsided, currently experiencing some swelling in the ankle alongside the ankle giving out while walking occasionally. Visited podiatry who mentioned she was having nerve impingement. Occasionally has a small pinch in the front of the ankle that gets up to a 5/10 pain.  RED FLAGS: None   PLOF: Independent  PATIENT GOALS: stop the ankle from giving out.  NEXT MD VISIT: need to schedule ---------------------------------------------------------------------------------------------  OBJECTIVE:  Note: Objective measures were completed at Evaluation unless otherwise noted.  DIAGNOSTIC FINDINGS: Ordered for L foot.  PATIENT SURVEYS:  LEFS 4/80  COGNITION: Overall cognitive status: Within functional limits for tasks assessed  SENSATION: WFL  EDEMA:  No swelling noted  MUSCLE LENGTH: Hamstrings: Right 10 deg; Left 10 deg  POSTURE: forward head  PALPATION: No tenderness  LOWER EXTREMITY ROM:  Active ROM Right eval Left eval  Hip flexion    Hip extension    Hip abduction    Hip adduction    Hip internal rotation    Hip external rotation    Knee flexion    Knee  extension    Ankle dorsiflexion WFL 12  Ankle plantarflexion [facility] WFL  Ankle inversion [facility]  Ankle eversion WFL WFL   (Blank rows = not tested)  ! Indicates pain with testing  LOWER EXTREMITY MMT:  MMT Right eval Left eval  Hip flexion    Hip extension    Hip abduction    Hip adduction    Hip internal rotation    Hip external rotation    Knee flexion    Knee extension    Ankle dorsiflexion 5 4  Ankle plantarflexion 5 4  Ankle inversion 5 4-  Ankle eversion     (Blank rows = not tested)  ! Indicates pain with testing  LOWER EXTREMITY SPECIAL TESTS:  Ankle special tests: Anterior drawer test: negative   Slump test: negative GAIT: Distance walked: 185ft Assistive device utilized: None Level of assistance: Complete Independence Comments: slow cadence    [facility] Adult PT Treatment:                                                DATE: 11/02/2023 Therapeutic Activity: Recumbent bike 8' for activity tolerance Split stance on compliant surface w/ torso rotation 3x1', 1000g ball Heel raise on compliant surface 2x12, 3s hold UE assisted squat 2x12 forward step down on compliant surface 2x15                                                                                                                                [facility] Adult PT Treatment:                                                DATE: 11/02/2023   Self Care: Pt education, detailed below POC discussion    PATIENT EDUCATION:  Education details: Pt received education regarding HEP performance, ADL performance, functional activity tolerance, impairment education, appropriate performance of therapeutic activities. Person educated: Patient Education method: Explanation, Demonstration, Tactile cues, Verbal cues, and Handouts Education comprehension: verbalized understanding and returned demonstration  HOME EXERCISE PROGRAM: Access Code: [email or web address] URL: [email or web address] Date:  10/29/2023 Prepared by: Sheliah Plane  Exercises - Seated Ankle Inversion with Resistance and Legs Crossed  - 1 x daily - 4-7 x weekly - 3  sets - 10 reps - Long Sitting Calf Stretch with Strap  - 1 x daily - 7 x weekly - 2-3 sets - 1 reps - 34m hold - Heel Raises with Counter Support  - 1 x daily - 7 x weekly - 3 sets - 10 reps - Heel Toe Raises with Counter Support  - 1 x daily - 4-7 x weekly - 3 sets - 10 reps - Standing Hip Abduction with Counter Support  - 1 x daily - 4-7 x weekly - 3 sets - 10 reps ---------------------------------------------------------------------------------------------  ASSESSMENT:  CLINICAL IMPRESSION: Pt attended physical therapy session for continuation of treatment regarding L ankle pain and giving out. Today's treatment focused on improvement of  ankle strength/stability, and acitivity tolerance. Pt showed  great tolerance to treatment and demonstrated improvement with less ankle giving out episodes between visits. Some difficulties continue with confidence in L ankle during prolonged activity. Pt required moderate verbal/tactile cuing alongside contact gaurd  assistance for safe and appropriate performance of today's activities with a balance component. Continue with therapeutic focus on ankle strength/stability, gait practice and general activity tolerance to build confidence with ADLs and community participation.   Eval impression (10/29/2023): Pt. attended today's physical therapy session for evaluation of L knee pain. Pt has complaints of L ankle giving out during prolonged walking and minimal swelling,. Pt has notable deficits with ankle stability, strength, and gastroc motility  Signs and symptoms are concurrent with ankle instability. Pt would benefit from therapeutic focus on ankle stability in weight bearing, global ankle strengthening, and gastroc motility.  Treatment performed today focused on pt education detailed in obj Pt demonstrated great understanding  of education provided. required minimal verbal/tactile cues and no physical assistance for appropriate performance with today's activities. Pt requires the intervention of skilled outpatient physical therapy to address the aforementioned deficits and progress towards a functional level in line with therapeutic goals.   OBJECTIVE IMPAIRMENTS: difficulty walking, decreased strength, impaired flexibility, and pain.   ACTIVITY LIMITATIONS: locomotion level, Stairs  PARTICIPATION LIMITATIONS: shopping, community activity, and yard work  PERSONAL FACTORS: Age, Fitness, and Time since onset of injury/illness/exacerbation are also affecting patient's functional outcome.   REHAB POTENTIAL: Good  CLINICAL DECISION MAKING: Stable/uncomplicated  EVALUATION COMPLEXITY: Low   GOALS: Goals reviewed with patient? YES  SHORT TERM GOALS: Target date: 11/19/2023 Pt will be independent with administered HEP to demonstrate the competency necessary for long term managemnet of symptoms at home.  Baseline: Goal status: INITIAL    LONG TERM GOALS: Target date: 12/10/2023  Pt. Will achieve a LEFS score of less than 4 as to demonstrate improvement in self-perceived functional ability with daily activities.  Baseline: 4/80 Goal status: INITIAL  2.  Pt will improve Global ankle strength to a 5/5 to demonstrate improvement in strength for quality of motion and activity performance.  Baseline: see obj chart Goal status: INITIAL  3.   Pt will independently ambulate 600 with no AD and no reports of ankle giving out to demonstrate improved weightbearing tolerance, BLE strength, and functional capacity for community ambulation.  Baseline: 347ft, ankle gives out Goal status: INITIAL      --------------------------------------------------------------------------------------------- PLAN:  PT FREQUENCY: 1x/week pt requested 1 time per week  PT DURATION: 6 weeks  PLANNED INTERVENTIONS: 97110-Therapeutic  exercises, 97530- Therapeutic activity, [id number]- Neuromuscular re-education, 97535- Self Care, 16109- Manual therapy, [phone]- Gait training, [phone]- Aquatic Therapy, Stair training, Taping, and Joint mobilization  PLAN FOR NEXT SESSION: Continue with therapeutic focus on ankle strength/stability,  gait practice and general activity tolerance to build confidence with ADLs and community participation.   Sheliah Plane, PT, DPT 11/02/2023, 11:21 AM    Date of referral: 10/15/2023 Referring provider: Reymundo Poll, MD Referring diagnosis? Chronis L knee pain Treatment diagnosis? (if different than referring diagnosis) L ankle pain, gait abnormalities  What was this (referring dx) caused by? Ongoing Issue  Ashby Dawes of Condition: Chronic (continuous duration > 3 months)   Laterality: Lt  Current Functional Measure Score: LEFS 4/80  Objective measurements identify impairments when they are compared to normal values, the uninvolved extremity, and prior level of function.  [x]  Yes  []  No  Objective assessment of functional ability: Minimal functional limitations   Briefly describe symptoms: ankle gives out when walking  How did symptoms start: no moi  Average pain intensity:  Last 24 hours: 0/10  Past week: 5/10  How often does the pt experience symptoms? Occasionally  How much have the symptoms interfered with usual daily activities? A little bit  How has condition changed since care began at this facility? NA - initial visit  In general, how is the patients overall health? Good   BACK PAIN (STarT Back Screening Tool) No

## 2023-11-10 ENCOUNTER — Ambulatory Visit: Admitting: Physical Therapy

## 2023-11-12 ENCOUNTER — Ambulatory Visit: Admitting: Physical Therapy

## 2023-11-12 NOTE — Therapy (Deleted)
 OUTPATIENT PHYSICAL THERAPY LOWER EXTREMITY EVALUATION   Patient Name: Megan Robbins MRN: [id number] DOB:1936-06-26, 88 y.o., female Today's Date: 11/12/2023  END OF SESSION:     Past Medical History:  Diagnosis Date   Acute urticaria 12/26/2021   Anxiety    Bronchitis due to COVID-19 virus 03/11/2021   Compression fracture of L3 vertebra (HCC) 05/13/2017   Dysuria 06/14/2021   Eustachian tube dysfunction, right 04/01/2018   Function kidney decreased 05/13/2017   Hypertension    Impacted cerumen of left ear 04/01/2018   Low back strain, initial encounter 02/22/2021   Mid back pain 05/08/2017   Rash 06/23/2019   Seasonal allergic rhinitis 04/01/2018   Stress and adjustment reaction 05/08/2017   Past Surgical History:  Procedure Laterality Date   ABDOMINAL HYSTERECTOMY     APPENDECTOMY     FOOT SURGERY     HEEL SPUR SURGERY     TONSILLECTOMY     Patient Active Problem List   Diagnosis Date Noted   Needle phobia 10/15/2023   Chronic pain of left knee 09/25/2023   Allergic rhinitis 10/09/2022   Goals of care, counseling/discussion 09/20/2022   Low TSH level 12/26/2021   Stage 3b chronic kidney disease (HCC) 12/26/2021   Anemia 12/26/2021   Goiter 12/26/2021   Hyperlipidemia 12/26/2021   Prediabetes 12/26/2021   Overweight 05/23/2015   Essential hypertension 05/08/2015    PCP: Gwenevere Abbot, MD   REFERRING PROVIDER: Reymundo Poll, MD  REFERRING DIAG: [phone] (ICD-10-CM) - Chronic pain of left knee  Rationale for Evaluation and Treatment: Rehabilitation  THERAPY DIAG:  No diagnosis found.  PERTINENT HISTORY: HTN, CKD3, needle phobia  WEIGHT BEARING RESTRICTIONS: No  FALLS:  Has patient fallen in last 6 months? Yes. Number of falls 1  LIVING ENVIRONMENT: Lives with: lives with their family Lives in: House/apartment Stairs: No Has following equipment at home: None  OCCUPATION: retired   PRECAUTIONS:  None ---------------------------------------------------------------------------------------------  SUBJECTIVE:   SUBJECTIVE STATEMENT: Pt stated that she has had no ankle giving out episodes since last visit, has been compliant with HEP and feels that it is already helping. Stated that she has lied to her family about previous falls/ LOB episodes.  Eval statement 10/29/2023:  Pt  used to have complaints of pain in the L knee, But now that has subsided, currently experiencing some swelling in the ankle alongside the ankle giving out while walking occasionally. Visited podiatry who mentioned she was having nerve impingement. Occasionally has a small pinch in the front of the ankle that gets up to a 5/10 pain.  RED FLAGS: None   PLOF: Independent  PATIENT GOALS: stop the ankle from giving out.  NEXT MD VISIT: need to schedule ---------------------------------------------------------------------------------------------  OBJECTIVE:  Note: Objective measures were completed at Evaluation unless otherwise noted.  DIAGNOSTIC FINDINGS: Ordered for L foot.  PATIENT SURVEYS:  LEFS 4/80  COGNITION: Overall cognitive status: Within functional limits for tasks assessed     SENSATION: WFL  EDEMA:  No swelling noted  MUSCLE LENGTH: Hamstrings: Right 10 deg; Left 10 deg  POSTURE: forward head  PALPATION: No tenderness  LOWER EXTREMITY ROM:  Active ROM Right eval Left eval  Hip flexion    Hip extension    Hip abduction    Hip adduction    Hip internal rotation    Hip external rotation    Knee flexion    Knee extension    Ankle dorsiflexion WFL 12  Ankle plantarflexion [facility] WFL  Ankle inversion [facility]  Ankle eversion WFL WFL   (Blank rows = not tested)  ! Indicates pain with testing  LOWER EXTREMITY MMT:  MMT Right eval Left eval  Hip flexion    Hip extension    Hip abduction    Hip adduction    Hip internal rotation    Hip external rotation    Knee flexion     Knee extension    Ankle dorsiflexion 5 4  Ankle plantarflexion 5 4  Ankle inversion 5 4-  Ankle eversion     (Blank rows = not tested)  ! Indicates pain with testing  LOWER EXTREMITY SPECIAL TESTS:  Ankle special tests: Anterior drawer test: negative   Slump test: negative GAIT: Distance walked: 139ft Assistive device utilized: None Level of assistance: Complete Independence Comments: slow cadence    [facility] Adult PT Treatment:                                                DATE: 11/02/2023 Therapeutic Activity: Recumbent bike 8' for activity tolerance Split stance on compliant surface w/ torso rotation 3x1', 1000g ball Heel raise on compliant surface 2x12, 3s hold UE assisted squat 2x12 forward step down on compliant surface 2x15                                                                                                                                [facility] Adult PT Treatment:                                                DATE: 11/12/2023   Self Care: Pt education, detailed below POC discussion    PATIENT EDUCATION:  Education details: Pt received education regarding HEP performance, ADL performance, functional activity tolerance, impairment education, appropriate performance of therapeutic activities. Person educated: Patient Education method: Explanation, Demonstration, Tactile cues, Verbal cues, and Handouts Education comprehension: verbalized understanding and returned demonstration  HOME EXERCISE PROGRAM: Access Code: [email or web address] URL: [email or web address] Date: 10/29/2023 Prepared by: Sheliah Plane  Exercises - Seated Ankle Inversion with Resistance and Legs Crossed  - 1 x daily - 4-7 x weekly - 3 sets - 10 reps - Long Sitting Calf Stretch with Strap  - 1 x daily - 7 x weekly - 2-3 sets - 1 reps - 2m hold - Heel Raises with Counter Support  - 1 x daily - 7 x weekly - 3 sets - 10 reps - Heel Toe Raises with Counter Support  - 1 x daily -  4-7 x weekly - 3 sets - 10 reps - Standing Hip Abduction with Counter Support  - 1 x daily - 4-7 x weekly - 3 sets - 10 reps ---------------------------------------------------------------------------------------------  ASSESSMENT:  CLINICAL IMPRESSION: Pt attended physical therapy session for continuation of treatment regarding ***. Today's treatment focused on improvement of  ***. Pt showed  *** tolerance to administered treatment with *** adverse effects by the end of session. Improvement was noted with *** by the end of today's session. Pt required *** verbal/tactile cuing alongside *** physical assistance for safe and appropriate performance of ***. Continue with therapeutic focus on ***.   Pt attended physical therapy session for continuation of treatment regarding L ankle pain and giving out. Today's treatment focused on improvement of  ankle strength/stability, and acitivity tolerance. Pt showed  great tolerance to treatment and demonstrated improvement with less ankle giving out episodes between visits. Some difficulties continue with confidence in L ankle during prolonged activity. Pt required moderate verbal/tactile cuing alongside contact gaurd  assistance for safe and appropriate performance of today's activities with a balance component. Continue with therapeutic focus on ankle strength/stability, gait practice and general activity tolerance to build confidence with ADLs and community participation.   Eval impression (10/29/2023): Pt. attended today's physical therapy session for evaluation of L knee pain. Pt has complaints of L ankle giving out during prolonged walking and minimal swelling,. Pt has notable deficits with ankle stability, strength, and gastroc motility  Signs and symptoms are concurrent with ankle instability. Pt would benefit from therapeutic focus on ankle stability in weight bearing, global ankle strengthening, and gastroc motility.  Treatment performed today focused on  pt education detailed in obj Pt demonstrated great understanding of education provided. required minimal verbal/tactile cues and no physical assistance for appropriate performance with today's activities. Pt requires the intervention of skilled outpatient physical therapy to address the aforementioned deficits and progress towards a functional level in line with therapeutic goals.   OBJECTIVE IMPAIRMENTS: difficulty walking, decreased strength, impaired flexibility, and pain.   ACTIVITY LIMITATIONS: locomotion level, Stairs  PARTICIPATION LIMITATIONS: shopping, community activity, and yard work  PERSONAL FACTORS: Age, Fitness, and Time since onset of injury/illness/exacerbation are also affecting patient's functional outcome.   REHAB POTENTIAL: Good  CLINICAL DECISION MAKING: Stable/uncomplicated  EVALUATION COMPLEXITY: Low   GOALS: Goals reviewed with patient? YES  SHORT TERM GOALS: Target date: 11/19/2023 Pt will be independent with administered HEP to demonstrate the competency necessary for long term managemnet of symptoms at home.  Baseline: Goal status: INITIAL    LONG TERM GOALS: Target date: 12/10/2023  Pt. Will achieve a LEFS score of less than 4 as to demonstrate improvement in self-perceived functional ability with daily activities.  Baseline: 4/80 Goal status: INITIAL  2.  Pt will improve Global ankle strength to a 5/5 to demonstrate improvement in strength for quality of motion and activity performance.  Baseline: see obj chart Goal status: INITIAL  3.   Pt will independently ambulate 600 with no AD and no reports of ankle giving out to demonstrate improved weightbearing tolerance, BLE strength, and functional capacity for community ambulation.  Baseline: 367ft, ankle gives out Goal status: INITIAL      --------------------------------------------------------------------------------------------- PLAN:  PT FREQUENCY: 1x/week pt requested 1 time per  week  PT DURATION: 6 weeks  PLANNED INTERVENTIONS: 97110-Therapeutic exercises, 97530- Therapeutic activity, 97112- Neuromuscular re-education, 97535- Self Care, 78295- Manual therapy, [phone]- Gait training, [phone]- Aquatic Therapy, Stair training, Taping, and Joint mobilization  PLAN FOR NEXT SESSION: Continue with therapeutic focus on ankle strength/stability, gait practice and general activity tolerance to build confidence with ADLs and community participation.   Sheliah Plane, PT, DPT 11/12/2023, 5:08 PM  Date of referral: 10/15/2023 Referring provider: Reymundo Poll, MD Referring diagnosis? Chronis L knee pain Treatment diagnosis? (if different than referring diagnosis) L ankle pain, gait abnormalities  What was this (referring dx) caused by? Ongoing Issue  Ashby Dawes of Condition: Chronic (continuous duration > 3 months)   Laterality: Lt  Current Functional Measure Score: LEFS 4/80  Objective measurements identify impairments when they are compared to normal values, the uninvolved extremity, and prior level of function.  [x]  Yes  []  No  Objective assessment of functional ability: Minimal functional limitations   Briefly describe symptoms: ankle gives out when walking  How did symptoms start: no moi  Average pain intensity:  Last 24 hours: 0/10  Past week: 5/10  How often does the pt experience symptoms? Occasionally  How much have the symptoms interfered with usual daily activities? A little bit  How has condition changed since care began at this facility? NA - initial visit  In general, how is the patients overall health? Good   BACK PAIN (STarT Back Screening Tool) No

## 2023-11-17 ENCOUNTER — Encounter: Payer: Self-pay | Admitting: Physical Therapy

## 2023-11-17 ENCOUNTER — Ambulatory Visit: Attending: Internal Medicine | Admitting: Physical Therapy

## 2023-11-17 DIAGNOSIS — R2689 Other abnormalities of gait and mobility: Secondary | ICD-10-CM | POA: Insufficient documentation

## 2023-11-17 DIAGNOSIS — M25572 Pain in left ankle and joints of left foot: Secondary | ICD-10-CM | POA: Insufficient documentation

## 2023-11-17 NOTE — Therapy (Signed)
 OUTPATIENT PHYSICAL THERAPY LOWER EXTREMITY EVALUATION   Patient Name: Megan Robbins MRN: [id number] DOB:06-23-1936, 88 y.o., female Today's Date: 11/17/2023  END OF SESSION:  PT End of Session - 11/17/23 0956     Visit Number 3    Number of Visits 7    Date for PT Re-Evaluation 12/10/23    Authorization - Visit Number 2    Authorization - Number of Visits 6    PT Start Time 0957    PT Stop Time 1035    PT Time Calculation (min) 38 min    Activity Tolerance Patient tolerated treatment well    Behavior During Therapy [facility] for tasks assessed/performed               Past Medical History:  Diagnosis Date   Acute urticaria 12/26/2021   Anxiety    Bronchitis due to COVID-19 virus 03/11/2021   Compression fracture of L3 vertebra (HCC) 05/13/2017   Dysuria 06/14/2021   Eustachian tube dysfunction, right 04/01/2018   Function kidney decreased 05/13/2017   Hypertension    Impacted cerumen of left ear 04/01/2018   Low back strain, initial encounter 02/22/2021   Mid back pain 05/08/2017   Rash 06/23/2019   Seasonal allergic rhinitis 04/01/2018   Stress and adjustment reaction 05/08/2017   Past Surgical History:  Procedure Laterality Date   ABDOMINAL HYSTERECTOMY     APPENDECTOMY     FOOT SURGERY     HEEL SPUR SURGERY     TONSILLECTOMY     Patient Active Problem List   Diagnosis Date Noted   Needle phobia 10/15/2023   Chronic pain of left knee 09/25/2023   Allergic rhinitis 10/09/2022   Goals of care, counseling/discussion 09/20/2022   Low TSH level 12/26/2021   Stage 3b chronic kidney disease (HCC) 12/26/2021   Anemia 12/26/2021   Goiter 12/26/2021   Hyperlipidemia 12/26/2021   Prediabetes 12/26/2021   Overweight 05/23/2015   Essential hypertension 05/08/2015    PCP: Gwenevere Abbot, MD   REFERRING PROVIDER: Reymundo Poll, MD  REFERRING DIAG: [phone] (ICD-10-CM) - Chronic pain of left knee  Rationale for Evaluation and Treatment:  Rehabilitation  THERAPY DIAG:  Pain in left ankle and joints of left foot  Other abnormalities of gait and mobility  PERTINENT HISTORY: HTN, CKD3, needle phobia  WEIGHT BEARING RESTRICTIONS: No  FALLS:  Has patient fallen in last 6 months? Yes. Number of falls 1  LIVING ENVIRONMENT: Lives with: lives with their family Lives in: House/apartment Stairs: No Has following equipment at home: None  OCCUPATION: retired   PRECAUTIONS: None ---------------------------------------------------------------------------------------------  SUBJECTIVE:   SUBJECTIVE STATEMENT: Pt attended today's session with reports of 0/10 pain. Pt stated that they have maintained good compliance with current HEP.  Had started noticing some numbness on the outside of the lower leg when she sleeps.   Eval statement 10/29/2023:  Pt  used to have complaints of pain in the L knee, But now that has subsided, currently experiencing some swelling in the ankle alongside the ankle giving out while walking occasionally. Visited podiatry who mentioned she was having nerve impingement. Occasionally has a small pinch in the front of the ankle that gets up to a 5/10 pain.  RED FLAGS: None   PLOF: Independent  PATIENT GOALS: stop the ankle from giving out.  NEXT MD VISIT: need to schedule ---------------------------------------------------------------------------------------------  OBJECTIVE:  Note: Objective measures were completed at Evaluation unless otherwise noted.  DIAGNOSTIC FINDINGS: Ordered for L foot.  PATIENT SURVEYS:  LEFS  4/80  COGNITION: Overall cognitive status: Within functional limits for tasks assessed     SENSATION: WFL  EDEMA:  No swelling noted  MUSCLE LENGTH: Hamstrings: Right 10 deg; Left 10 deg  POSTURE: forward head  PALPATION: No tenderness  LOWER EXTREMITY ROM:  Active ROM Right eval Left eval  Hip flexion    Hip extension    Hip abduction    Hip adduction     Hip internal rotation    Hip external rotation    Knee flexion    Knee extension    Ankle dorsiflexion WFL 12  Ankle plantarflexion [facility] WFL  Ankle inversion [facility]  Ankle eversion WFL WFL   (Blank rows = not tested)  ! Indicates pain with testing  LOWER EXTREMITY MMT:  MMT Right eval Left eval  Hip flexion    Hip extension    Hip abduction    Hip adduction    Hip internal rotation    Hip external rotation    Knee flexion    Knee extension    Ankle dorsiflexion 5 4  Ankle plantarflexion 5 4  Ankle inversion 5 4-  Ankle eversion     (Blank rows = not tested)  ! Indicates pain with testing  LOWER EXTREMITY SPECIAL TESTS:  Ankle special tests: Anterior drawer test: negative   Slump test: negative GAIT: Distance walked: 182ft Assistive device utilized: None Level of assistance: Complete Independence Comments: slow cadence   [facility] Adult PT Treatment:                                                DATE: 11/17/2023 Therapeutic Activity: NuStep 5' for activity tolerance Heel raise on compliant surface 2x12, 3s hold UE assisted squat on compliant surface 2x12 Forward/Lat step down on compliant surface 2x15 Split stance with ball toss on compliant surface, LLE forward 1x20 tosses outside of BOS  [facility] Adult PT Treatment:                                                DATE: 11/02/2023 Therapeutic Activity: Recumbent bike 8' for activity tolerance Split stance on compliant surface w/ torso rotation 3x1', 1000g ball Heel raise on compliant surface 2x12, 3s hold UE assisted squat 2x12 forward step down on compliant surface 2x15                                                                                                                                [facility] Adult PT Treatment:  DATE: 11/17/2023   Self Care: Pt education, detailed below POC discussion    PATIENT EDUCATION:  Education details: Pt received  education regarding HEP performance, ADL performance, functional activity tolerance, impairment education, appropriate performance of therapeutic activities. Person educated: Patient Education method: Explanation, Demonstration, Tactile cues, Verbal cues, and Handouts Education comprehension: verbalized understanding and returned demonstration  HOME EXERCISE PROGRAM: Access Code: [email or web address] URL: [email or web address] Date: 10/29/2023 Prepared by: Albesa Huguenin  Exercises - Seated Ankle Inversion with Resistance and Legs Crossed  - 1 x daily - 4-7 x weekly - 3 sets - 10 reps - Long Sitting Calf Stretch with Strap  - 1 x daily - 7 x weekly - 2-3 sets - 1 reps - 92m hold - Heel Raises with Counter Support  - 1 x daily - 7 x weekly - 3 sets - 10 reps - Heel Toe Raises with Counter Support  - 1 x daily - 4-7 x weekly - 3 sets - 10 reps - Standing Hip Abduction with Counter Support  - 1 x daily - 4-7 x weekly - 3 sets - 10 reps ---------------------------------------------------------------------------------------------  ASSESSMENT:  CLINICAL IMPRESSION: Pt attended physical therapy session for continuation of treatment regarding L ankle dysfunction. Today's treatment focused on improvement of  ankle stability, global BLE strength, and activity tolerance. Pt showed  great tolerance to administered treatment with no adverse effects by the end of session. Pt reports feeling much better with only one report of ankle giving out in the last 2 weeks, saying "it wasn't even a bad one." Pt required minimal verbal/tactile cuing alongside Contact gaurd assistance for safe and appropriate performance of today's balance/stability activities. Continue with therapeutic focus on ankle strength/stability, gait practice and general activity tolerance to build confidence with ADLs and community participation.   Eval impression (10/29/2023): Pt. attended today's physical therapy session for evaluation of  L knee pain. Pt has complaints of L ankle giving out during prolonged walking and minimal swelling,. Pt has notable deficits with ankle stability, strength, and gastroc motility  Signs and symptoms are concurrent with ankle instability. Pt would benefit from therapeutic focus on ankle stability in weight bearing, global ankle strengthening, and gastroc motility.  Treatment performed today focused on pt education detailed in obj Pt demonstrated great understanding of education provided. required minimal verbal/tactile cues and no physical assistance for appropriate performance with today's activities. Pt requires the intervention of skilled outpatient physical therapy to address the aforementioned deficits and progress towards a functional level in line with therapeutic goals.   OBJECTIVE IMPAIRMENTS: difficulty walking, decreased strength, impaired flexibility, and pain.   ACTIVITY LIMITATIONS: locomotion level, Stairs  PARTICIPATION LIMITATIONS: shopping, community activity, and yard work  PERSONAL FACTORS: Age, Fitness, and Time since onset of injury/illness/exacerbation are also affecting patient's functional outcome.   REHAB POTENTIAL: Good  CLINICAL DECISION MAKING: Stable/uncomplicated  EVALUATION COMPLEXITY: Low   GOALS: Goals reviewed with patient? YES  SHORT TERM GOALS: Target date: 11/19/2023 Pt will be independent with administered HEP to demonstrate the competency necessary for long term managemnet of symptoms at home.  Baseline: Goal status: INITIAL    LONG TERM GOALS: Target date: 12/10/2023  Pt. Will achieve a LEFS score of less than 4 as to demonstrate improvement in self-perceived functional ability with daily activities.  Baseline: 4/80 Goal status: INITIAL  2.  Pt will improve Global ankle strength to a 5/5 to demonstrate improvement in strength for quality of motion and activity performance.  Baseline: see obj chart Goal status: INITIAL  3.   Pt will  independently ambulate 600 with no AD and no reports of ankle giving out to demonstrate improved weightbearing tolerance, BLE strength, and functional capacity for community ambulation.  Baseline: 372ft, ankle gives out Goal status: INITIAL      --------------------------------------------------------------------------------------------- PLAN:  PT FREQUENCY: 1x/week pt requested 1 time per week  PT DURATION: 6 weeks  PLANNED INTERVENTIONS: 97110-Therapeutic exercises, 97530- Therapeutic activity, 97112- Neuromuscular re-education, 97535- Self Care, 78295- Manual therapy, [phone]- Gait training, [phone]- Aquatic Therapy, Stair training, Taping, and Joint mobilization  PLAN FOR NEXT SESSION: Continue with therapeutic focus on ankle strength/stability, gait practice and general activity tolerance to build confidence with ADLs and community participation.   Albesa Huguenin, PT, DPT 11/17/2023, 10:36 AM    Date of referral: 10/15/2023 Referring provider: Lasandra Points, MD Referring diagnosis? Chronis L knee pain Treatment diagnosis? (if different than referring diagnosis) L ankle pain, gait abnormalities  What was this (referring dx) caused by? Ongoing Issue  Lonne Roan of Condition: Chronic (continuous duration > 3 months)   Laterality: Lt  Current Functional Measure Score: LEFS 4/80  Objective measurements identify impairments when they are compared to normal values, the uninvolved extremity, and prior level of function.  [x]  Yes  []  No  Objective assessment of functional ability: Minimal functional limitations   Briefly describe symptoms: ankle gives out when walking  How did symptoms start: no moi  Average pain intensity:  Last 24 hours: 0/10  Past week: 5/10  How often does the pt experience symptoms? Occasionally  How much have the symptoms interfered with usual daily activities? A little bit  How has condition changed since care began at this facility? NA - initial  visit  In general, how is the patients overall health? Good   BACK PAIN (STarT Back Screening Tool) No

## 2023-11-24 ENCOUNTER — Ambulatory Visit: Admitting: Physical Therapy

## 2023-12-01 ENCOUNTER — Ambulatory Visit: Admitting: Physical Therapy

## 2023-12-01 ENCOUNTER — Encounter: Payer: Self-pay | Admitting: Physical Therapy

## 2023-12-01 DIAGNOSIS — M25572 Pain in left ankle and joints of left foot: Secondary | ICD-10-CM | POA: Diagnosis not present

## 2023-12-01 DIAGNOSIS — R2689 Other abnormalities of gait and mobility: Secondary | ICD-10-CM

## 2023-12-01 NOTE — Therapy (Signed)
 OUTPATIENT PHYSICAL THERAPY TREATMENT AND PROGRESS NOTE   Patient Name: Megan Robbins MRN: [id number] DOB:06-01-1936, 88 y.o., female Today's Date: 12/01/2023  END OF SESSION:  PT End of Session - 12/01/23 1031     Visit Number 4    Number of Visits 7    Date for PT Re-Evaluation 12/10/23    Authorization - Visit Number 3    Authorization - Number of Visits 6    PT Start Time 0958    PT Stop Time 1025    PT Time Calculation (min) 27 min    Activity Tolerance Patient tolerated treatment well    Behavior During Therapy [facility] for tasks assessed/performed                Past Medical History:  Diagnosis Date   Acute urticaria 12/26/2021   Anxiety    Bronchitis due to COVID-19 virus 03/11/2021   Compression fracture of L3 vertebra (HCC) 05/13/2017   Dysuria 06/14/2021   Eustachian tube dysfunction, right 04/01/2018   Function kidney decreased 05/13/2017   Hypertension    Impacted cerumen of left ear 04/01/2018   Low back strain, initial encounter 02/22/2021   Mid back pain 05/08/2017   Rash 06/23/2019   Seasonal allergic rhinitis 04/01/2018   Stress and adjustment reaction 05/08/2017   Past Surgical History:  Procedure Laterality Date   ABDOMINAL HYSTERECTOMY     APPENDECTOMY     FOOT SURGERY     HEEL SPUR SURGERY     TONSILLECTOMY     Patient Active Problem List   Diagnosis Date Noted   Needle phobia 10/15/2023   Chronic pain of left knee 09/25/2023   Allergic rhinitis 10/09/2022   Goals of care, counseling/discussion 09/20/2022   Low TSH level 12/26/2021   Stage 3b chronic kidney disease (HCC) 12/26/2021   Anemia 12/26/2021   Goiter 12/26/2021   Hyperlipidemia 12/26/2021   Prediabetes 12/26/2021   Overweight 05/23/2015   Essential hypertension 05/08/2015    PCP: Jackolyn Masker, MD   REFERRING PROVIDER: Lasandra Points, MD  REFERRING DIAG: [phone] (ICD-10-CM) - Chronic pain of left knee  Rationale for Evaluation and Treatment:  Rehabilitation  THERAPY DIAG:  Pain in left ankle and joints of left foot  Other abnormalities of gait and mobility  PERTINENT HISTORY: HTN, CKD3, needle phobia  WEIGHT BEARING RESTRICTIONS: No  FALLS:  Has patient fallen in last 6 months? Yes. Number of falls 1  LIVING ENVIRONMENT: Lives with: lives with their family Lives in: House/apartment Stairs: No Has following equipment at home: None  OCCUPATION: retired   PRECAUTIONS: None ---------------------------------------------------------------------------------------------  SUBJECTIVE:   SUBJECTIVE STATEMENT: Pt attended today's session with reports of 0/10 pain. Pt stated that they have maintained great compliance with current HEP.  Is able to do everything she wants pain free, even able to go to church in heels now and notices no swelling   Eval statement 10/29/2023:  Pt  used to have complaints of pain in the L knee, But now that has subsided, currently experiencing some swelling in the ankle alongside the ankle giving out while walking occasionally. Visited podiatry who mentioned she was having nerve impingement. Occasionally has a small pinch in the front of the ankle that gets up to a 5/10 pain.  RED FLAGS: None   PLOF: Independent  PATIENT GOALS: stop the ankle from giving out.  NEXT MD VISIT: need to schedule ---------------------------------------------------------------------------------------------  OBJECTIVE:  Note: Objective measures were completed at Evaluation unless otherwise noted.  DIAGNOSTIC FINDINGS:  Ordered for L foot.  PATIENT SURVEYS:  LEFS 4/80  COGNITION: Overall cognitive status: Within functional limits for tasks assessed     SENSATION: WFL  EDEMA:  No swelling noted  MUSCLE LENGTH: Hamstrings: Right 10 deg; Left 10 deg  POSTURE: forward head  PALPATION: No tenderness  LOWER EXTREMITY ROM:  Active ROM Right eval Left eval  Hip flexion    Hip extension    Hip  abduction    Hip adduction    Hip internal rotation    Hip external rotation    Knee flexion    Knee extension    Ankle dorsiflexion WFL 12  Ankle plantarflexion [facility] WFL  Ankle inversion [facility]  Ankle eversion WFL WFL   (Blank rows = not tested)  ! Indicates pain with testing  LOWER EXTREMITY MMT:  MMT Right eval Left eval 12/01/2023   Hip flexion     Hip extension     Hip abduction     Hip adduction     Hip internal rotation     Hip external rotation     Knee flexion     Knee extension     Ankle dorsiflexion 5 4 5   Ankle plantarflexion 5 4 5   Ankle inversion 5 4- 5  Ankle eversion      (Blank rows = not tested)  ! Indicates pain with testing  LOWER EXTREMITY SPECIAL TESTS:  Ankle special tests: Anterior drawer test: negative   Slump test: negative GAIT: Distance walked: 129ft Assistive device utilized: None Level of assistance: Complete Independence Comments: slow cadence  [facility] Adult PT Treatment:                                                DATE: 12/01/2023  Therapeutic Exercise: Nu Step 5' Anterior tibialis release with 4 way Ankle PROM Therapeutic Activity: Re-evaluative measures POC discussion and HEP long term management education       PATIENT EDUCATION:  Education details: Pt received education regarding HEP performance, ADL performance, functional activity tolerance, impairment education, appropriate performance of therapeutic activities. Person educated: Patient Education method: Explanation, Demonstration, Tactile cues, Verbal cues, and Handouts Education comprehension: verbalized understanding and returned demonstration  HOME EXERCISE PROGRAM: Access Code: [email or web address] URL: [email or web address] Date: 10/29/2023 Prepared by: Albesa Huguenin  Exercises - Seated Ankle Inversion with Resistance and Legs Crossed  - 1 x daily - 4-7 x weekly - 3 sets - 10 reps - Long Sitting Calf Stretch with Strap  - 1 x daily - 7 x weekly - 2-3  sets - 1 reps - 65m hold - Heel Raises with Counter Support  - 1 x daily - 7 x weekly - 3 sets - 10 reps - Heel Toe Raises with Counter Support  - 1 x daily - 4-7 x weekly - 3 sets - 10 reps - Standing Hip Abduction with Counter Support  - 1 x daily - 4-7 x weekly - 3 sets - 10 reps ---------------------------------------------------------------------------------------------  ASSESSMENT:  CLINICAL IMPRESSION: Pt attended physical therapy session for re-evaluation of L ankle dysfunction. Pt has met  all goals and  no longer notices swelling or giving out episodes.  Pt required no cuing as well as no physical assistance for safe and appropriate performance of today's activities. Pt is content and has met all stated rehab goals, pt is to d/c at the  completion of today's session    Eval impression (10/29/2023): Pt. attended today's physical therapy session for evaluation of L knee pain. Pt has complaints of L ankle giving out during prolonged walking and minimal swelling,. Pt has notable deficits with ankle stability, strength, and gastroc motility  Signs and symptoms are concurrent with ankle instability. Pt would benefit from therapeutic focus on ankle stability in weight bearing, global ankle strengthening, and gastroc motility.  Treatment performed today focused on pt education detailed in obj Pt demonstrated great understanding of education provided. required minimal verbal/tactile cues and no physical assistance for appropriate performance with today's activities. Pt requires the intervention of skilled outpatient physical therapy to address the aforementioned deficits and progress towards a functional level in line with therapeutic goals.   OBJECTIVE IMPAIRMENTS: difficulty walking, decreased strength, impaired flexibility, and pain.   ACTIVITY LIMITATIONS: locomotion level, Stairs  PARTICIPATION LIMITATIONS: shopping, community activity, and yard work  PERSONAL FACTORS: Age, Fitness, and  Time since onset of injury/illness/exacerbation are also affecting patient's functional outcome.   REHAB POTENTIAL: Good  CLINICAL DECISION MAKING: Stable/uncomplicated  EVALUATION COMPLEXITY: Low   GOALS: Goals reviewed with patient? YES  SHORT TERM GOALS: Target date: 11/19/2023 Pt will be independent with administered HEP to demonstrate the competency necessary for long term managemnet of symptoms at home.  Baseline: Goal status: MET 12/01/2023     LONG TERM GOALS: Target date: 12/10/2023  Pt. Will achieve a LEFS score of at least 20/80 as to demonstrate improvement in self-perceived functional ability with daily activities.  Baseline: 4/80 Goal status: MET 12/01/2023 (66/80)  2.  Pt will improve Global ankle strength to a 5/5 to demonstrate improvement in strength for quality of motion and activity performance.  Baseline: see obj chart Goal status: MET 12/01/2023  3.   Pt will independently ambulate 600 with no AD and no reports of ankle giving out to demonstrate improved weightbearing tolerance, BLE strength, and functional capacity for community ambulation.  Baseline: 365ft, ankle gives out Goal status: MET 12/01/2023 (Pt reports being able to walk all day with no issue, demonstrated 1029ft in clinic)      --------------------------------------------------------------------------------------------- PLAN:  PT FREQUENCY: 1x/week pt requested 1 time per week  PT DURATION: 6 weeks  PLANNED INTERVENTIONS: 97110-Therapeutic exercises, 97530- Therapeutic activity, 97112- Neuromuscular re-education, 97535- Self Care, [address]- Manual therapy, [phone]- Gait training, [phone]- Aquatic Therapy, Stair training, Taping, and Joint mobilization  PLAN FOR NEXT SESSION: Continue with therapeutic focus on ankle strength/stability, gait practice and general activity tolerance to build confidence with ADLs and community participation.   Albesa Huguenin, PT, DPT 12/01/2023, 10:35 AM  PHYSICAL  THERAPY DISCHARGE SUMMARY  Visits from Start of Care: 4  Current functional level related to goals / functional outcomes: All rehab goals met   Remaining deficits: None, see assessment   Education / Equipment: See assessment   Patient agrees to discharge. Patient goals were met. Patient is being discharged due to meeting the stated rehab goals.

## 2023-12-02 ENCOUNTER — Ambulatory Visit (INDEPENDENT_AMBULATORY_CARE_PROVIDER_SITE_OTHER): Admitting: Podiatry

## 2023-12-02 ENCOUNTER — Ambulatory Visit (INDEPENDENT_AMBULATORY_CARE_PROVIDER_SITE_OTHER)

## 2023-12-02 DIAGNOSIS — M79675 Pain in left toe(s): Secondary | ICD-10-CM | POA: Diagnosis not present

## 2023-12-02 DIAGNOSIS — M7751 Other enthesopathy of right foot: Secondary | ICD-10-CM | POA: Diagnosis not present

## 2023-12-02 DIAGNOSIS — M79674 Pain in right toe(s): Secondary | ICD-10-CM

## 2023-12-02 DIAGNOSIS — M7752 Other enthesopathy of left foot: Secondary | ICD-10-CM | POA: Diagnosis not present

## 2023-12-02 DIAGNOSIS — B351 Tinea unguium: Secondary | ICD-10-CM

## 2023-12-02 DIAGNOSIS — M722 Plantar fascial fibromatosis: Secondary | ICD-10-CM | POA: Diagnosis not present

## 2023-12-02 MED ORDER — BETAMETHASONE SOD PHOS & ACET 6 (3-3) MG/ML IJ SUSP
3.0000 mg | Freq: Once | INTRAMUSCULAR | Status: AC
  Administered 2023-12-02: 3 mg via INTRA_ARTICULAR

## 2023-12-02 NOTE — Progress Notes (Signed)
   Chief Complaint  Patient presents with   Foot Pain    RM#10 Bilateral heel pain.    HPI: 88 y.o. female presenting today for follow-up evaluation of chronic plantar fasciitis bilateral.  Injections helped temporarily.  She has declined surgery on several occasions.  She is also requesting nail debridement today  Past Medical History:  Diagnosis Date   Acute urticaria 12/26/2021   Anxiety    Bronchitis due to COVID-19 virus 03/11/2021   Compression fracture of L3 vertebra (HCC) 05/13/2017   Dysuria 06/14/2021   Eustachian tube dysfunction, right 04/01/2018   Function kidney decreased 05/13/2017   Hypertension    Impacted cerumen of left ear 04/01/2018   Low back strain, initial encounter 02/22/2021   Mid back pain 05/08/2017   Rash 06/23/2019   Seasonal allergic rhinitis 04/01/2018   Stress and adjustment reaction 05/08/2017    Past Surgical History:  Procedure Laterality Date   ABDOMINAL HYSTERECTOMY     APPENDECTOMY     FOOT SURGERY     HEEL SPUR SURGERY     TONSILLECTOMY      No Known Allergies   Physical Exam: General: The patient is alert and oriented x3 in no acute distress.  Dermatology: Skin is warm, dry and supple bilateral lower extremities.  Nails are elongated and thickened 1-5 bilateral with associated tenderness  Vascular: Palpable pedal pulses bilaterally. Capillary refill within normal limits.  No edema.  No erythema.  Neurological: Grossly intact via light touch  Musculoskeletal Exam: No gross pedal deformity.  Chronic pain with palpation to the plantar heels bilateral  Radiographic exam B/L feet 03/16/2023 Chronic degenerative changes noted throughout the pedal joints of the foot.  Mild osteopenia also noted.  No acute fractures identified.  Plantar heel spur noted on lateral view  Assessment/Plan of Care: 1.  Chronic plantar fasciitis bilateral 2.  Plantar heel spurs bilateral 3.  History of lower back pain with chronic lumbar  radiculopathy 4.  Pain due to onychomycosis of toenails both  -Patient evaluated.  Mechanical debridement of nails 1-5 bilateral performed using nail nipper without incident or bleeding - Injection of 0.5 cc Celestone  Soluspan injected in bilateral plantar fascia -Continue good supportive tennis shoes and sneakers -Return to clinic as needed        Dot Gazella, DPM [facility]  Dr. Dot Gazella, DPM    [address] [place], [place] [address]                Office [phone]  Fax [phone]

## 2023-12-09 ENCOUNTER — Ambulatory Visit

## 2023-12-09 VITALS — Ht 62.0 in | Wt 170.0 lb

## 2023-12-09 DIAGNOSIS — Z0000 Encounter for general adult medical examination without abnormal findings: Secondary | ICD-10-CM

## 2023-12-09 NOTE — Progress Notes (Signed)
 Because this visit was a virtual/telehealth visit,  certain criteria was not obtained, such a blood pressure, CBG if applicable, and timed get up and go. Any medications not marked as "taking" were not mentioned during the medication reconciliation part of the visit. Any vitals not documented were not able to be obtained due to this being a telehealth visit or patient was unable to self-report a recent blood pressure reading due to a lack of equipment at home via telehealth. Vitals that have been documented are verbally provided by the patient.   Subjective:   Megan Robbins is a 88 y.o. who presents for a Medicare Wellness preventive visit.  Visit Complete: Virtual I connected with  [name] on 12/09/23 by a audio enabled telemedicine application and verified that I am speaking with the correct person using two identifiers.  Patient Location: Home  Provider Location: Office/Clinic  I discussed the limitations of evaluation and management by telemedicine. The patient expressed understanding and agreed to proceed.  Vital Signs: Because this visit was a virtual/telehealth visit, some criteria may be missing or patient reported. Any vitals not documented were not able to be obtained and vitals that have been documented are patient reported.  VideoDeclined- This patient declined Librarian, academic. Therefore the visit was completed with audio only.  Persons Participating in Visit: Patient.  AWV Questionnaire: No: Patient Medicare AWV questionnaire was not completed prior to this visit.  Cardiac Risk Factors include: advanced age (>71men, >34 women);family history of premature cardiovascular disease;hypertension;obesity (BMI >30kg/m2)     Objective:    Today's Vitals   12/09/23 1301  Weight: 170 lb (77.1 kg)  Height: 5\' 2"  (1.575 m)  PainSc: 0-No pain   Body mass index is 31.09 kg/m.     12/09/2023    1:03 PM 10/29/2023    8:29 AM 08/28/2023    10:04 AM 10/15/2022    2:28 PM 09/17/2022   11:23 AM 01/03/2022    7:58 PM  Advanced Directives  Does Patient Have a Medical Advance Directive? No No No No No No  Would patient like information on creating a medical advance directive? No - Patient declined No - Patient declined No - Patient declined No - Patient declined No - Patient declined No - Patient declined    Current Medications (verified) Outpatient Encounter Medications as of 12/09/2023  Medication Sig   acetaminophen  (TYLENOL ) 325 MG tablet Take 2 tablets (650 mg total) by mouth every 6 (six) hours as needed for moderate pain.   amLODipine -atorvastatin  (CADUET ) 5-10 MG tablet TAKE 1 TABLET BY MOUTH DAILY   atenolol  (TENORMIN ) 50 MG tablet TAKE 1 TABLET(50 MG) BY MOUTH DAILY   diclofenac  Sodium (VOLTAREN ) 1 % GEL Apply 2 g topically 4 (four) times daily.   fluticasone  (FLONASE ) 50 MCG/ACT nasal spray Place 1 spray into both nostrils daily.   hydrALAZINE  (APRESOLINE ) 25 MG tablet TAKE 1 TABLET(25 MG) BY MOUTH TWICE DAILY   sodium chloride (OCEAN) 0.65 % SOLN nasal spray Place 1 spray into both nostrils as needed for congestion.   No facility-administered encounter medications on file as of 12/09/2023.    Allergies (verified) Patient has no known allergies.   History: Past Medical History:  Diagnosis Date   Acute urticaria 12/26/2021   Anxiety    Bronchitis due to COVID-19 virus 03/11/2021   Compression fracture of L3 vertebra (HCC) 05/13/2017   Dysuria 06/14/2021   Eustachian tube dysfunction, right 04/01/2018   Function kidney decreased 05/13/2017  Hypertension    Impacted cerumen of left ear 04/01/2018   Low back strain, initial encounter 02/22/2021   Mid back pain 05/08/2017   Rash 06/23/2019   Seasonal allergic rhinitis 04/01/2018   Stress and adjustment reaction 05/08/2017   Past Surgical History:  Procedure Laterality Date   ABDOMINAL HYSTERECTOMY     APPENDECTOMY     FOOT SURGERY     HEEL SPUR SURGERY      TONSILLECTOMY     Family History  Problem Relation Age of Onset   Hyperlipidemia Mother    Hypertension Mother    Diabetes Daughter    Diabetes Son    Social History   Socioeconomic History   Marital status: Married    Spouse name: Not on file   Number of children: Not on file   Years of education: Not on file   Highest education level: Not on file  Occupational History   Not on file  Tobacco Use   Smoking status: Never   Smokeless tobacco: Never  Vaping Use   Vaping status: Never Used  Substance and Sexual Activity   Alcohol use: No   Drug use: Never   Sexual activity: Not on file  Other Topics Concern   Not on file  Social History Narrative   Not on file   Social Drivers of Health   Financial Resource Strain: Low Risk  (12/09/2023)   Overall Financial Resource Strain (CARDIA)    Difficulty of Paying Living Expenses: Not hard at all  Food Insecurity: No Food Insecurity (12/09/2023)   Hunger Vital Sign    Worried About Running Out of Food in the Last Year: Never true    Ran Out of Food in the Last Year: Never true  Transportation Needs: No Transportation Needs (12/09/2023)   PRAPARE - Administrator, Civil Service (Medical): No    Lack of Transportation (Non-Medical): No  Physical Activity: Sufficiently Active (12/09/2023)   Exercise Vital Sign    Days of Exercise per Week: 5 days    Minutes of Exercise per Session: 30 min  Stress: No Stress Concern Present (12/09/2023)   [employer] of Occupational Health - Occupational Stress Questionnaire    Feeling of Stress : Not at all  Social Connections: Moderately Integrated (12/09/2023)   Social Connection and Isolation Panel [NHANES]    Frequency of Communication with Friends and Family: More than three times a week    Frequency of Social Gatherings with Friends and Family: More than three times a week    Attends Religious Services: More than 4 times per year    Active Member of [employer] or Organizations: No     Attends Engineer, structural: Never    Marital Status: Married    Tobacco Counseling Counseling given: Not Answered    Clinical Intake:  Pre-visit preparation completed: Yes  Pain : No/denies pain Pain Score: 0-No pain     BMI - recorded: 31.09 Nutritional Status: BMI > 30  Obese Nutritional Risks: None Diabetes: No  Lab Results  Component Value Date   HGBA1C 6.3 05/10/2021     How often do you need to have someone help you when you read instructions, pamphlets, or other written materials from your doctor or pharmacy?: 1 - Never  Interpreter Needed?: No  Information entered by :: Teshara Moree N. Lahela Woodin, LPN.   Activities of Daily Living     12/09/2023    1:10 PM 08/28/2023   10:04 AM  In your present  state of health, do you have any difficulty performing the following activities:  Hearing? 0 0  Vision? 0 0  Difficulty concentrating or making decisions? 0 0  Walking or climbing stairs? 0 0  Dressing or bathing? 0 0  Doing errands, shopping? 0 0  Preparing Food and eating ? N   Using the Toilet? N   In the past six months, have you accidently leaked urine? N   Do you have problems with loss of bowel control? N   Managing your Medications? N   Managing your Finances? N   Housekeeping or managing your Housekeeping? N     Patient Care Team: Jackolyn Masker, MD as PCP - General (Internal Medicine) [facility] as Consulting Physician (Ophthalmology) Dot Gazella, DPM as Consulting Physician (Podiatry) Maris Sickle, MD as Referring Physician (Ophthalmology)  Indicate any recent Medical Services you may have received from other than [facility] providers in the past year (date may be approximate).     Assessment:   This is a routine wellness examination for Claudia.  Hearing/Vision screen Hearing Screening - Comments:: Denies hearing difficulties.  Vision Screening - Comments:: Wears reading glasses; bilateral cataracts removed - up to date  with routine eye exams with Dr. Maris Sickle.    Goals Addressed               This Visit's Progress     Patient Stated (pt-stated)        12/09/2023: Continue to walk, stay busy and independent.       Depression Screen     12/09/2023    1:06 PM 09/25/2023    8:38 AM 08/28/2023   10:04 AM 10/15/2022    2:34 PM 09/17/2022   11:23 AM 09/17/2022   11:16 AM 08/14/2022    9:35 AM  PHQ 2/9 Scores  PHQ - 2 Score 0 0 0 0 0 0 0  PHQ- 9 Score 0   0 0 0 0    Fall Risk     12/09/2023    1:04 PM 10/15/2023    9:49 AM 09/25/2023    8:37 AM 08/28/2023   10:04 AM 10/15/2022    2:28 PM  Fall Risk   Falls in the past year? 1 1 1 1  0  Number falls in past yr: 0 0 0 0 0  Injury with Fall? 1 1 1 1  0  Risk for fall due to :  No Fall Risks No Fall Risks Other (Comment) No Fall Risks  Risk for fall due to: Comment   slipped in parking lot slipped in a parking lot   Follow up Falls evaluation completed;Falls prevention discussed;Education provided Falls evaluation completed  Falls evaluation completed;Falls prevention discussed Falls evaluation completed;Falls prevention discussed    MEDICARE RISK AT HOME:  Medicare Risk at Home Any stairs in or around the home?: No If so, are there any without handrails?: No Home free of loose throw rugs in walkways, pet beds, electrical cords, etc?: Yes Adequate lighting in your home to reduce risk of falls?: Yes Life alert?: No Use of a cane, walker or w/c?: No Grab bars in the bathroom?: No (PENDING) Shower chair or bench in shower?: Yes Elevated toilet seat or a handicapped toilet?: No  TIMED UP AND GO:  Was the test performed?  No  Cognitive Function: 6CIT completed    12/09/2023    1:27 PM  MMSE - Mini Mental State Exam  Not completed: Unable to complete  12/09/2023    1:05 PM 09/17/2022   11:24 AM  6CIT Screen  What Year? 0 points 0 points  What month? 0 points 0 points  What time? 0 points 0 points  Count back from 20 0 points 0  points  Months in reverse 0 points 0 points  Repeat phrase 0 points 0 points  Total Score 0 points 0 points    Immunizations Immunization History  Administered Date(s) Administered   Fluad Quad(high Dose 65+) 05/21/2020, 05/10/2021, 08/14/2022   Fluad Trivalent(High Dose 65+) 07/08/2023   PFIZER(Purple Top)SARS-COV-2 Vaccination 10/02/2019, 11/01/2019   PNEUMOCOCCAL CONJUGATE-20 08/14/2022    Screening Tests Health Maintenance  Topic Date Due   DTaP/Tdap/Td (1 - Tdap) Never done   Zoster Vaccines- Shingrix (1 of 2) Never done   DEXA SCAN  Never done   INFLUENZA VACCINE  03/04/2024   Medicare Annual Wellness (AWV)  12/08/2024   Pneumonia Vaccine 42+ Years old  Completed   HPV VACCINES  Aged Out   Meningococcal B Vaccine  Aged Out   COVID-19 Vaccine  Discontinued    Health Maintenance  Health Maintenance Due  Topic Date Due   DTaP/Tdap/Td (1 - Tdap) Never done   Zoster Vaccines- Shingrix (1 of 2) Never done   DEXA SCAN  Never done   Health Maintenance Items Addressed: Yes Patient is due for the following care gaps: DEXA SCAN, Dtap and Shingrix Vaccines.  Additional Screening:  Vision Screening: Recommended annual ophthalmology exams for early detection of glaucoma and other disorders of the eye.  Dental Screening: Recommended annual dental exams for proper oral hygiene  Community Resource Referral / Chronic Care Management: CRR required this visit?  No   CCM required this visit?  No     Plan:     I have personally reviewed and noted the following in the patient's chart:   Medical and social history Use of alcohol, tobacco or illicit drugs  Current medications and supplements including opioid prescriptions. Patient is not currently taking opioid prescriptions. Functional ability and status Nutritional status Physical activity Advanced directives List of other physicians Hospitalizations, surgeries, and ER visits in previous 12 months Vitals Screenings  to include cognitive, depression, and falls Referrals and appointments  In addition, I have reviewed and discussed with patient certain preventive protocols, quality metrics, and best practice recommendations. A written personalized care plan for preventive services as well as general preventive health recommendations were provided to patient.     Margette Sheldon, LPN   4/0/9811   After Visit Summary: (Declined) Due to this being a telephonic visit, with patients personalized plan was offered to patient but patient Declined AVS at this time   Nurse Notes: Patient is due for the following care gaps: DEXA SCAN, Dtap and Shingrix Vaccines.

## 2023-12-09 NOTE — Patient Instructions (Signed)
 Ms. Barranco , Thank you for taking time to come for your Medicare Wellness Visit. I appreciate your ongoing commitment to your health goals. Please review the following plan we discussed and let me know if I can assist you in the future.   Referrals/Orders/Follow-Ups/Clinician Recommendations: None at this time.  You are doing excellent, keep up the good work.  Recommended Goals:  Aim for 30 minutes of exercise or brisk walking 5 days per week.  Drink 6-8 glasses of water each day. Eat 5-6 servings of fresh vegetables and fruits per day. Continue to do brain exercises such as reading, puzzles, games on your phone to help keep the brain sharp and active.  This is a list of the screening recommended for you and due dates:  Health Maintenance  Topic Date Due   DTaP/Tdap/Td vaccine (1 - Tdap) Never done   Zoster (Shingles) Vaccine (1 of 2) Never done   DEXA scan (bone density measurement)  Never done   Flu Shot  03/04/2024   Medicare Annual Wellness Visit  12/08/2024   Pneumonia Vaccine  Completed   HPV Vaccine  Aged Out   Meningitis B Vaccine  Aged Out   COVID-19 Vaccine  Discontinued    Advanced directives: (Declined) Advance directive discussed with you today. Even though you declined this today, please call our office should you change your mind, and we can give you the proper paperwork for you to fill out.  Next Medicare Annual Wellness Visit scheduled for next year: Yes, 12/14/2024 at 1:00 p.m. PHONE visit with Nurse Health Advisor.  Have you seen your provider in the last 6 months (3 months if uncontrolled diabetes)? Yes

## 2023-12-13 ENCOUNTER — Other Ambulatory Visit: Payer: Self-pay | Admitting: Internal Medicine

## 2023-12-13 DIAGNOSIS — I10 Essential (primary) hypertension: Secondary | ICD-10-CM

## 2023-12-14 NOTE — Telephone Encounter (Signed)
 Medication sent to pharmacy

## 2024-01-18 ENCOUNTER — Encounter: Payer: Self-pay | Admitting: *Deleted

## 2024-02-17 ENCOUNTER — Encounter: Payer: Self-pay | Admitting: Podiatry

## 2024-02-17 ENCOUNTER — Ambulatory Visit (INDEPENDENT_AMBULATORY_CARE_PROVIDER_SITE_OTHER): Admitting: Podiatry

## 2024-02-17 VITALS — Ht 62.0 in | Wt 170.0 lb

## 2024-02-17 DIAGNOSIS — M722 Plantar fascial fibromatosis: Secondary | ICD-10-CM | POA: Diagnosis not present

## 2024-02-17 MED ORDER — BETAMETHASONE SOD PHOS & ACET 6 (3-3) MG/ML IJ SUSP
3.0000 mg | Freq: Once | INTRAMUSCULAR | Status: AC
  Administered 2024-02-17: 3 mg via INTRA_ARTICULAR

## 2024-02-17 NOTE — Progress Notes (Signed)
   Chief Complaint  Patient presents with   Injections    Pt is here to receive injection into right foot due to pain.    HPI: 88 y.o. female presenting today for follow-up evaluation of chronic plantar fasciitis bilateral.  Injections helped temporarily.  She has declined surgery on several occasions.  She is also requesting nail debridement today  Past Medical History:  Diagnosis Date   Acute urticaria 12/26/2021   Anxiety    Bronchitis due to COVID-19 virus 03/11/2021   Compression fracture of L3 vertebra (HCC) 05/13/2017   Dysuria 06/14/2021   Eustachian tube dysfunction, right 04/01/2018   Function kidney decreased 05/13/2017   Hypertension    Impacted cerumen of left ear 04/01/2018   Low back strain, initial encounter 02/22/2021   Mid back pain 05/08/2017   Rash 06/23/2019   Seasonal allergic rhinitis 04/01/2018   Stress and adjustment reaction 05/08/2017    Past Surgical History:  Procedure Laterality Date   ABDOMINAL HYSTERECTOMY     APPENDECTOMY     FOOT SURGERY     HEEL SPUR SURGERY     TONSILLECTOMY      No Known Allergies   Physical Exam: General: The patient is alert and oriented x3 in no acute distress.  Dermatology: Skin is warm, dry and supple bilateral lower extremities.  Nails are elongated and thickened 1-5 bilateral with associated tenderness  Vascular: Palpable pedal pulses bilaterally. Capillary refill within normal limits.  No edema.  No erythema.  Neurological: Grossly intact via light touch  Musculoskeletal Exam: No gross pedal deformity.  Chronic pain with palpation to the plantar heels bilateral  Radiographic exam B/L feet 03/16/2023 Chronic degenerative changes noted throughout the pedal joints of the foot.  Mild osteopenia also noted.  No acute fractures identified.  Plantar heel spur noted on lateral view  Assessment/Plan of Care: 1.  Chronic plantar fasciitis bilateral 2.  Plantar heel spurs bilateral 3.  History of lower back pain  with chronic lumbar radiculopathy 4.  Pain due to onychomycosis of toenails both  -Patient evaluated.  Mechanical debridement of nails 1-5 bilateral performed using nail nipper without incident or bleeding as a courtesy for the patient - Injection of 0.5 cc Celestone  Soluspan injected in left plantar fascia -Continue good supportive tennis shoes and sneakers -Return to clinic as needed        Thresa EMERSON Sar, DPM [facility]  Dr. Thresa EMERSON Sar, DPM    [address] [place], [place] [address]                Office [phone]  Fax [phone]

## 2024-03-11 ENCOUNTER — Other Ambulatory Visit: Payer: Self-pay | Admitting: Internal Medicine

## 2024-03-11 DIAGNOSIS — I10 Essential (primary) hypertension: Secondary | ICD-10-CM

## 2024-03-30 ENCOUNTER — Ambulatory Visit (INDEPENDENT_AMBULATORY_CARE_PROVIDER_SITE_OTHER): Admitting: Podiatry

## 2024-03-30 ENCOUNTER — Encounter: Payer: Self-pay | Admitting: Podiatry

## 2024-03-30 VITALS — Ht 62.0 in | Wt 170.0 lb

## 2024-03-30 DIAGNOSIS — M722 Plantar fascial fibromatosis: Secondary | ICD-10-CM | POA: Diagnosis not present

## 2024-03-30 DIAGNOSIS — M79674 Pain in right toe(s): Secondary | ICD-10-CM | POA: Diagnosis not present

## 2024-03-30 DIAGNOSIS — B351 Tinea unguium: Secondary | ICD-10-CM

## 2024-03-30 DIAGNOSIS — M79675 Pain in left toe(s): Secondary | ICD-10-CM

## 2024-03-30 MED ORDER — BETAMETHASONE SOD PHOS & ACET 6 (3-3) MG/ML IJ SUSP
3.0000 mg | Freq: Once | INTRAMUSCULAR | Status: AC
  Administered 2024-03-30: 3 mg via INTRA_ARTICULAR
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
## 2024-04-14 ENCOUNTER — Telehealth: Payer: Self-pay | Admitting: Podiatry

## 2024-04-14 NOTE — Telephone Encounter (Signed)
 Received surgical consent form  Spoke to pt and she is wanting to schedule in October and asked if she could call back to schedule the surgery.

## 2024-05-23 ENCOUNTER — Ambulatory Visit: Admitting: Podiatry

## 2024-05-23 ENCOUNTER — Encounter: Payer: Self-pay | Admitting: Podiatry

## 2024-05-23 VITALS — Ht 62.0 in | Wt 170.0 lb

## 2024-05-23 DIAGNOSIS — M79675 Pain in left toe(s): Secondary | ICD-10-CM

## 2024-05-23 DIAGNOSIS — B351 Tinea unguium: Secondary | ICD-10-CM

## 2024-05-23 DIAGNOSIS — M722 Plantar fascial fibromatosis: Secondary | ICD-10-CM | POA: Diagnosis not present

## 2024-05-23 DIAGNOSIS — M79674 Pain in right toe(s): Secondary | ICD-10-CM | POA: Diagnosis not present

## 2024-05-23 MED ORDER — BETAMETHASONE SOD PHOS & ACET 6 (3-3) MG/ML IJ SUSP
3.0000 mg | Freq: Once | INTRAMUSCULAR | Status: AC
  Administered 2024-05-23: 3 mg via INTRA_ARTICULAR

## 2024-05-23 NOTE — Progress Notes (Signed)
   Chief Complaint  Patient presents with   Plantar Fasciitis    Pt is here due to plantar fasciitis in both feet, states she will like an injection into both feet today.    HPI: 88 y.o. female presenting today for follow-up evaluation of chronic plantar fasciitis bilateral.  Injections helped temporarily.  She has declined surgery on several occasions.  She is also requesting nail debridement today  Past Medical History:  Diagnosis Date   Acute urticaria 12/26/2021   Anxiety    Bronchitis due to COVID-19 virus 03/11/2021   Compression fracture of L3 vertebra (HCC) 05/13/2017   Dysuria 06/14/2021   Eustachian tube dysfunction, right 04/01/2018   Function kidney decreased 05/13/2017   Hypertension    Impacted cerumen of left ear 04/01/2018   Low back strain, initial encounter 02/22/2021   Mid back pain 05/08/2017   Rash 06/23/2019   Seasonal allergic rhinitis 04/01/2018   Stress and adjustment reaction 05/08/2017    Past Surgical History:  Procedure Laterality Date   ABDOMINAL HYSTERECTOMY     APPENDECTOMY     FOOT SURGERY     HEEL SPUR SURGERY     TONSILLECTOMY      No Known Allergies   Physical Exam: General: The patient is alert and oriented x3 in no acute distress.  Dermatology: Skin is warm, dry and supple bilateral lower extremities.  Nails are elongated and thickened 1-5 bilateral with associated tenderness  Vascular: Palpable pedal pulses bilaterally. Capillary refill within normal limits.  No edema.  No erythema.  Neurological: Grossly intact via light touch  Musculoskeletal Exam: No gross pedal deformity.  Chronic pain with palpation to the plantar heels bilateral  Radiographic exam B/L feet 03/16/2023 Chronic degenerative changes noted throughout the pedal joints of the foot.  Mild osteopenia also noted.  No acute fractures identified.  Plantar heel spur noted on lateral view  Assessment/Plan of Care: 1.  Chronic plantar fasciitis bilateral 2.  Plantar  heel spurs bilateral 3.  History of lower back pain with chronic lumbar radiculopathy 4.  Pain due to onychomycosis of toenails both  -Patient evaluated.  Patient decided against surgery -Mechanical debridement of nails 1-5 bilateral performed using nail nipper without incident or bleeding as a courtesy for the patient - Injection of 0.5 cc Celestone  Soluspan injected in left plantar fascia -Continue good supportive tennis shoes and sneakers -Return to clinic as needed        Thresa EMERSON Sar, DPM [facility]  Dr. Thresa EMERSON Sar, DPM    [address] [place], [place] [address]                Office [phone]  Fax [phone]

## 2024-05-31 ENCOUNTER — Ambulatory Visit (INDEPENDENT_AMBULATORY_CARE_PROVIDER_SITE_OTHER): Payer: Self-pay | Admitting: *Deleted

## 2024-05-31 DIAGNOSIS — Z23 Encounter for immunization: Secondary | ICD-10-CM | POA: Diagnosis not present

## 2024-07-04 ENCOUNTER — Encounter: Payer: Self-pay | Admitting: Podiatry

## 2024-07-04 ENCOUNTER — Ambulatory Visit: Admitting: Podiatry

## 2024-07-04 DIAGNOSIS — M722 Plantar fascial fibromatosis: Secondary | ICD-10-CM

## 2024-07-04 MED ORDER — BETAMETHASONE SOD PHOS & ACET 6 (3-3) MG/ML IJ SUSP
3.0000 mg | Freq: Once | INTRAMUSCULAR | Status: AC
  Administered 2024-07-04: 3 mg via INTRA_ARTICULAR

## 2024-07-04 NOTE — Progress Notes (Signed)
   Chief Complaint  Patient presents with   Foot Pain    Follow up PF bilateral - still having pain in both, just waiting until she can have surgery    HPI: 88 y.o. female presenting today for follow-up evaluation of chronic plantar fasciitis bilateral.  Injections helped temporarily.  She has declined surgery on several occasions.  She is also requesting nail debridement today  Past Medical History:  Diagnosis Date   Acute urticaria 12/26/2021   Anxiety    Bronchitis due to COVID-19 virus 03/11/2021   Compression fracture of L3 vertebra (HCC) 05/13/2017   Dysuria 06/14/2021   Eustachian tube dysfunction, right 04/01/2018   Function kidney decreased 05/13/2017   Hypertension    Impacted cerumen of left ear 04/01/2018   Low back strain, initial encounter 02/22/2021   Mid back pain 05/08/2017   Rash 06/23/2019   Seasonal allergic rhinitis 04/01/2018   Stress and adjustment reaction 05/08/2017    Past Surgical History:  Procedure Laterality Date   ABDOMINAL HYSTERECTOMY     APPENDECTOMY     FOOT SURGERY     HEEL SPUR SURGERY     TONSILLECTOMY      No Known Allergies   Physical Exam: General: The patient is alert and oriented x3 in no acute distress.  Dermatology: Skin is warm, dry and supple bilateral lower extremities.  Nails are elongated and thickened 1-5 bilateral with associated tenderness  Vascular: Palpable pedal pulses bilaterally. Capillary refill within normal limits.  No edema.  No erythema.  Neurological: Grossly intact via light touch  Musculoskeletal Exam: No gross pedal deformity.  Chronic pain with palpation to the plantar heels bilateral  Radiographic exam B/L feet 03/16/2023 Chronic degenerative changes noted throughout the pedal joints of the foot.  Mild osteopenia also noted.  No acute fractures identified.  Plantar heel spur noted on lateral view  Assessment/Plan of Care: 1.  Chronic plantar fasciitis bilateral 2.  Plantar heel spurs  bilateral 3.  History of lower back pain with chronic lumbar radiculopathy 4.  Pain due to onychomycosis of toenails both  -Patient evaluated.  Patient decided against surgery -Mechanical debridement of nails 1-5 bilateral performed using nail nipper without incident or bleeding as a courtesy for the patient - Injection of 0.5 cc Celestone  Soluspan injected in left plantar fascia -Continue good supportive tennis shoes and sneakers -Return to clinic as needed        Thresa EMERSON Sar, DPM [facility]  Dr. Thresa EMERSON Sar, DPM    [address] [place], [place] [address]                Office [phone]  Fax [phone]

## 2024-07-13 ENCOUNTER — Ambulatory Visit: Payer: Self-pay

## 2024-07-13 ENCOUNTER — Other Ambulatory Visit: Payer: Self-pay

## 2024-07-13 VITALS — BP 138/72 | HR 77 | Temp 98.2°F | Ht 63.0 in | Wt 162.6 lb

## 2024-07-13 DIAGNOSIS — R051 Acute cough: Secondary | ICD-10-CM

## 2024-07-13 DIAGNOSIS — F40298 Other specified phobia: Secondary | ICD-10-CM

## 2024-07-13 DIAGNOSIS — I10 Essential (primary) hypertension: Secondary | ICD-10-CM

## 2024-07-13 MED ORDER — BENZONATATE 100 MG PO CAPS: 100.0000 mg | ORAL_CAPSULE | Freq: Three times a day (TID) | ORAL | 0 refills | Status: AC | PRN

## 2024-07-13 NOTE — Progress Notes (Signed)
 CC: Acute visit  HPI:  [name] is a 88 y.o. female living with a history stated below and presents today for acute visit. Please see problem based assessment and plan for additional details. Past Medical History:  Diagnosis Date   Acute urticaria 12/26/2021   Anxiety    Bronchitis due to COVID-19 virus 03/11/2021   Compression fracture of L3 vertebra (HCC) 05/13/2017   Dysuria 06/14/2021   Eustachian tube dysfunction, right 04/01/2018   Function kidney decreased 05/13/2017   Hypertension    Impacted cerumen of left ear 04/01/2018   Low back strain, initial encounter 02/22/2021   Mid back pain 05/08/2017   Rash 06/23/2019   Seasonal allergic rhinitis 04/01/2018   Stress and adjustment reaction 05/08/2017    Current Outpatient Medications on File Prior to Visit  Medication Sig Dispense Refill   acetaminophen  (TYLENOL ) 325 MG tablet Take 2 tablets (650 mg total) by mouth every 6 (six) hours as needed for moderate pain. 30 tablet 0   amLODipine -atorvastatin  (CADUET ) 5-10 MG tablet TAKE 1 TABLET BY MOUTH DAILY 30 tablet 11   atenolol  (TENORMIN ) 50 MG tablet TAKE 1 TABLET(50 MG) BY MOUTH DAILY 90 tablet 3   diclofenac  Sodium (VOLTAREN ) 1 % GEL Apply 2 g topically 4 (four) times daily. 100 g 0   fluticasone  (FLONASE ) 50 MCG/ACT nasal spray Place 1 spray into both nostrils daily. 16 g 2   hydrALAZINE  (APRESOLINE ) 25 MG tablet TAKE 1 TABLET(25 MG) BY MOUTH TWICE DAILY 180 tablet 0   sodium chloride (OCEAN) 0.65 % SOLN nasal spray Place 1 spray into both nostrils as needed for congestion. 60 mL 0   No current facility-administered medications on file prior to visit.    Family History  Problem Relation Age of Onset   Hyperlipidemia Mother    Hypertension Mother    Diabetes Daughter    Diabetes Son     Social History   Socioeconomic History   Marital status: Married    Spouse name: Not on file   Number of children: Not on file   Years of education: Not on file    Highest education level: Not on file  Occupational History   Not on file  Tobacco Use   Smoking status: Never   Smokeless tobacco: Never  Vaping Use   Vaping status: Never Used  Substance and Sexual Activity   Alcohol use: No   Drug use: Never   Sexual activity: Not on file  Other Topics Concern   Not on file  Social History Narrative   Not on file   Social Drivers of Health   Financial Resource Strain: Low Risk  (12/09/2023)   Overall Financial Resource Strain (CARDIA)    Difficulty of Paying Living Expenses: Not hard at all  Food Insecurity: No Food Insecurity (12/09/2023)   Hunger Vital Sign    Worried About Running Out of Food in the Last Year: Never true    Ran Out of Food in the Last Year: Never true  Transportation Needs: No Transportation Needs (12/09/2023)   PRAPARE - Administrator, Civil Service (Medical): No    Lack of Transportation (Non-Medical): No  Physical Activity: Sufficiently Active (12/09/2023)   Exercise Vital Sign    Days of Exercise per Week: 5 days    Minutes of Exercise per Session: 30 min  Stress: No Stress Concern Present (12/09/2023)   [employer] of Occupational Health - Occupational Stress Questionnaire    Feeling of Stress :  Not at all  Social Connections: Moderately Integrated (12/09/2023)   Social Connection and Isolation Panel    Frequency of Communication with Friends and Family: More than three times a week    Frequency of Social Gatherings with Friends and Family: More than three times a week    Attends Religious Services: More than 4 times per year    Active Member of [employer] or Organizations: No    Attends Banker Meetings: Never    Marital Status: Married  Catering Manager Violence: Not At Risk (12/09/2023)   Humiliation, Afraid, Rape, and Kick questionnaire    Fear of Current or Ex-Partner: No    Emotionally Abused: No    Physically Abused: No    Sexually Abused: No    Review of Systems: ROS  Per HPI,  assessment, plan Vitals:   07/13/24 1406  BP: 138/72  Pulse: 77  Temp: 98.2 F (36.8 C)  TempSrc: Oral  SpO2: 99%  Weight: 162 lb 9.6 oz (73.8 kg)  Height: 5' 3 (1.6 m)    Physical Exam: Physical Exam HENT:     Head: Normocephalic.     Nose: Rhinorrhea present.     Comments: Mild red swollen nasal turbinates noted    Mouth/Throat:     Mouth: Mucous membranes are moist.     Pharynx: No oropharyngeal exudate or posterior oropharyngeal erythema.  Cardiovascular:     Rate and Rhythm: Normal rate and regular rhythm.  Pulmonary:     Effort: Pulmonary effort is normal.     Breath sounds: Normal breath sounds. No wheezing.  Skin:    General: Skin is warm.  Neurological:     Mental Status: She is alert.      Assessment & Plan:     Patient seen with Dr. CHARLENA Eastern  Assessment & Plan Acute cough Patient developed cough this past Friday while she was at the hairdresser.  Her cough worsened over the weekend.  She is producing phlegm which is clear but also appears yellow sometimes.  Patient denies any fevers, chills, chest pain, shortness of breath.  She noticed mild wheezing last night which resolved on its own.  She has been checking her temperature at home and reports it has not gone over 98.3.  Patient was not around anyone sick.  She does not have any sore throat.  However she does have mild discomfort in her throat when she tries to cough up phlegm.  Does endorse pain close to her upper chest and around her neck region when she coughs.  Lungs were clear to auscultation on PE.  Based on other PE findings, patient likely has URTI due to a viral cause.  Discussed with her that treatment for this would be supportive and asked her to take adequate amounts of fluids.  Will prescribe Tessalon  Perles today to help with cough. - Tessalon  100 mg 3 times daily as needed Essential hypertension Blood pressure today was 138/72.  Patient initially reported that she felt hydralazine  was  causing her to pee more and to lose a lot of weight.  She denies any dizziness.  Does report that hydralazine  was taking food away from the body.Patient also reported that she checks her BP regularly at home and the readings were around 130s/70s.  Per charting, there were concerns for whitecoat hypertension during her previous visit.  Discussed with patient that hydralazine  is not a diuretic and that it has been helping maintain her BP that she should continue taking it with  her other blood pressure medications.  Counseled patient on keeping a log of blood pressure recordings daily and bringing it during her next visit.  Patient stated understanding. - Hydralazine  25 mg twice daily - Atenolol  50 mg daily - Amlodipine -atorvastatin  5-10 mg every day -Follow-up in 1 month Needle phobia Patient's last OV was on 10/15/2023 during which she was asked to return back in a month for BP check and labs.  Patient did not return back for follow-up then.  Patient reports that she does not want to get labs today as she is afraid of finding out any negative results.  Patient got teary while she explained this to us .  Informed patient that labs were necessary to help monitor proper medication use.  Informed her that her last labs were collected in 2022.  Patient declined labs today but promised that she will return in 1 month.  Discussed with her that we would not be refilling her medications if she would fail to show up in about a month for labs and chronic condition follow-up.  Patient stated understanding. - Follow-up in 1 month for lipid panel, A1c, BMP -Do not refill medications if patient does not follow-up in 1 month  No orders of the defined types were placed in this encounter.    Rebecka Pion, D.O. [facility] Internal Medicine, PGY-1 Date 07/13/2024 Time 2:48 PM

## 2024-07-13 NOTE — Patient Instructions (Addendum)
 Please follow the instructions as discussed in today's plan: - Start taking Tessalon  100 mg: 1 capsule by mouth 3 times a day as needed for cough - Continue taking all your blood pressure medications - Document your blood pressure daily and bring it during your next visit. - Please make an appointment and follow-up with us  within the next month for routine blood work and chronic condition management.  Thank you  Rebecka Pion, DO

## 2024-07-13 NOTE — Assessment & Plan Note (Signed)
 Blood pressure today was 138/72.  Patient initially reported that she felt hydralazine  was causing her to pee more and to lose a lot of weight.  She denies any dizziness.  Does report that hydralazine  was taking food away from the body.Patient also reported that she checks her BP regularly at home and the readings were around 130s/70s.  Per charting, there were concerns for whitecoat hypertension during her previous visit.  Discussed with patient that hydralazine  is not a diuretic and that it has been helping maintain her BP that she should continue taking it with her other blood pressure medications.  Counseled patient on keeping a log of blood pressure recordings daily and bringing it during her next visit.  Patient stated understanding. - Hydralazine  25 mg twice daily - Atenolol  50 mg daily - Amlodipine -atorvastatin  5-10 mg every day -Follow-up in 1 month

## 2024-07-13 NOTE — Assessment & Plan Note (Signed)
 Patient's last OV was on 10/15/2023 during which she was asked to return back in a month for BP check and labs.  Patient did not return back for follow-up then.  Patient reports that she does not want to get labs today as she is afraid of finding out any negative results.  Patient got teary while she explained this to us .  Informed patient that labs were necessary to help monitor proper medication use.  Informed her that her last labs were collected in 2022.  Patient declined labs today but promised that she will return in 1 month.  Discussed with her that we would not be refilling her medications if she would fail to show up in about a month for labs and chronic condition follow-up.  Patient stated understanding. - Follow-up in 1 month for lipid panel, A1c, BMP -Do not refill medications if patient does not follow-up in 1 month

## 2024-07-18 NOTE — Progress Notes (Signed)
 Internal Medicine Clinic Attending  I was physically present during the key portions of the resident provided service and participated in the medical decision making of patient's management care. I reviewed pertinent patient test results.  The assessment, diagnosis, and plan were formulated together and I agree with the documentation in the resident's note.  Rosan Dayton BROCKS, DO  Stressed the importance of lab work monitoring for the ongoing management of her prescription medications.  Encouraged her to obtain labs today, however will allow her to mentally prepare more.  I discussed we would not be refilling her medications in the future unless we can obtain the requried labs.

## 2024-08-29 ENCOUNTER — Ambulatory Visit: Admitting: Podiatry

## 2024-08-31 ENCOUNTER — Other Ambulatory Visit: Payer: Self-pay | Admitting: Internal Medicine

## 2024-08-31 DIAGNOSIS — E785 Hyperlipidemia, unspecified: Secondary | ICD-10-CM

## 2024-08-31 DIAGNOSIS — I10 Essential (primary) hypertension: Secondary | ICD-10-CM

## 2024-08-31 NOTE — Telephone Encounter (Signed)
 Medication sent to pharmacy

## 2024-09-05 ENCOUNTER — Ambulatory Visit: Admitting: Podiatry

## 2024-09-07 ENCOUNTER — Other Ambulatory Visit: Payer: Self-pay | Admitting: Student

## 2024-09-07 ENCOUNTER — Telehealth: Payer: Self-pay | Admitting: *Deleted

## 2024-09-07 DIAGNOSIS — R7989 Other specified abnormal findings of blood chemistry: Secondary | ICD-10-CM

## 2024-09-07 DIAGNOSIS — N1832 Chronic kidney disease, stage 3b: Secondary | ICD-10-CM

## 2024-09-07 DIAGNOSIS — E785 Hyperlipidemia, unspecified: Secondary | ICD-10-CM

## 2024-09-07 DIAGNOSIS — I10 Essential (primary) hypertension: Secondary | ICD-10-CM

## 2024-09-07 DIAGNOSIS — D649 Anemia, unspecified: Secondary | ICD-10-CM

## 2024-09-07 MED ORDER — ATENOLOL 50 MG PO TABS: 50.0000 mg | ORAL_TABLET | Freq: Every day | ORAL | 3 refills | Status: AC

## 2024-09-07 MED ORDER — HYDRALAZINE HCL 25 MG PO TABS: ORAL_TABLET | ORAL | 0 refills | Status: AC

## 2024-09-07 MED ORDER — AMLODIPINE-ATORVASTATIN 5-10 MG PO TABS: 1.0000 | ORAL_TABLET | Freq: Every day | ORAL | 3 refills | Status: AC

## 2024-09-07 NOTE — Progress Notes (Signed)
 Return call to patient in regards to hypertension/hyperlipidemia medication refill.  Patient has not had lab work done since 2022 due to needle phobia as well as fear of abnormal lab results.  At prior office visit, patient was informed that she would need to have lab work done prior to additional refills.  Although lab work would further guide her medication management in terms of statin dose and and monitor renal function with her hypertension, patient is at higher risk of adverse events if she were to be discontinued off of her regimen at this time.  Patient recognizes and understands the risk of incorrectly dosing her statin as well as risk not recognizing worsening renal function 2/2 hypertension due to not completing lab work.   Patient reports that she would be willing to come in and have lab work done at some point.    Plan: - I believe that the benefit of continuing to prescribe amlodipine -atorvastatin  5-10 mg daily, atenolol  50 mg daily, and hydralazine  25 mg BID without lab work at this time is greater than the risk of discontinuing patient's regimen - BMP and lipid profile placed for future encounter, patient was encouraged to have her laboratory work completed -Reiterated the importance of having laboratory work completed; however, again will continue prescribed medications due to benefit> risk conversation with patient   Damien Lease, DO Internal Medicine Resident: PGY-2 Please contact the on call pager at: [phone]

## 2024-09-07 NOTE — Telephone Encounter (Signed)
 Call to patient message for patient to call the clinics to discuss getting refills.    Copied from CRM [id number]. Topic: Clinical - Prescription Issue >> Sep 07, 2024  1:20 PM Alfonso ORN wrote: Reason for CRM: patient called and stated she need medications refills and was told she would need blood work before can do any refills  , patient stated reason she refusing due to afraid of blood work results , if office can not do the refills  she may have to find a provider   amLODipine -atorvastatin  (CADUET ) 5-10 MG tablet have 4 pills , and the atenolol  (TENORMIN ) 50 MG tablet have 13 pills left    Please contact patient

## 2024-09-14 ENCOUNTER — Ambulatory Visit: Admitting: Podiatry

## 2024-12-14 ENCOUNTER — Ambulatory Visit
